# Patient Record
Sex: Male | Born: 1970 | Race: Black or African American | Hispanic: No | Marital: Married
Health system: Southern US, Community
[De-identification: ages and names within clinical notes are randomized; demographics above are authoritative.]

## PROBLEM LIST (undated history)

## (undated) DIAGNOSIS — I509 Heart failure, unspecified: Secondary | ICD-10-CM

## (undated) DIAGNOSIS — I10 Essential (primary) hypertension: Secondary | ICD-10-CM

## (undated) DIAGNOSIS — I639 Cerebral infarction, unspecified: Secondary | ICD-10-CM

## (undated) HISTORY — PX: EYE SURGERY: SHX253

---

## 1998-08-12 ENCOUNTER — Emergency Department (HOSPITAL_COMMUNITY): Admission: EM | Admit: 1998-08-12 | Discharge: 1998-08-12 | Payer: Self-pay | Admitting: Emergency Medicine

## 1999-06-17 ENCOUNTER — Emergency Department (HOSPITAL_COMMUNITY): Admission: EM | Admit: 1999-06-17 | Discharge: 1999-06-17 | Payer: Self-pay | Admitting: *Deleted

## 2000-01-11 ENCOUNTER — Encounter: Payer: Self-pay | Admitting: Emergency Medicine

## 2000-01-11 ENCOUNTER — Emergency Department (HOSPITAL_COMMUNITY): Admission: EM | Admit: 2000-01-11 | Discharge: 2000-01-11 | Payer: Self-pay

## 2000-02-10 ENCOUNTER — Emergency Department (HOSPITAL_COMMUNITY): Admission: EM | Admit: 2000-02-10 | Discharge: 2000-02-10 | Payer: Self-pay | Admitting: Emergency Medicine

## 2001-05-31 ENCOUNTER — Emergency Department (HOSPITAL_COMMUNITY): Admission: EM | Admit: 2001-05-31 | Discharge: 2001-05-31 | Payer: Self-pay | Admitting: Emergency Medicine

## 2001-06-06 ENCOUNTER — Encounter: Admission: RE | Admit: 2001-06-06 | Discharge: 2001-06-06 | Payer: Self-pay | Admitting: Internal Medicine

## 2001-08-30 ENCOUNTER — Emergency Department (HOSPITAL_COMMUNITY): Admission: EM | Admit: 2001-08-30 | Discharge: 2001-08-30 | Payer: Self-pay | Admitting: Emergency Medicine

## 2001-11-24 ENCOUNTER — Emergency Department (HOSPITAL_COMMUNITY): Admission: EM | Admit: 2001-11-24 | Discharge: 2001-11-24 | Payer: Self-pay | Admitting: Emergency Medicine

## 2002-05-04 ENCOUNTER — Encounter: Admission: RE | Admit: 2002-05-04 | Discharge: 2002-05-04 | Payer: Self-pay | Admitting: Internal Medicine

## 2002-12-16 ENCOUNTER — Emergency Department (HOSPITAL_COMMUNITY): Admission: EM | Admit: 2002-12-16 | Discharge: 2002-12-16 | Payer: Self-pay | Admitting: Emergency Medicine

## 2002-12-16 ENCOUNTER — Encounter: Payer: Self-pay | Admitting: Emergency Medicine

## 2004-01-09 ENCOUNTER — Emergency Department (HOSPITAL_COMMUNITY): Admission: EM | Admit: 2004-01-09 | Discharge: 2004-01-09 | Payer: Self-pay | Admitting: Emergency Medicine

## 2006-09-01 ENCOUNTER — Emergency Department (HOSPITAL_COMMUNITY): Admission: EM | Admit: 2006-09-01 | Discharge: 2006-09-01 | Payer: Self-pay | Admitting: Family Medicine

## 2006-12-18 ENCOUNTER — Emergency Department (HOSPITAL_COMMUNITY): Admission: EM | Admit: 2006-12-18 | Discharge: 2006-12-18 | Payer: Self-pay | Admitting: Family Medicine

## 2008-06-06 ENCOUNTER — Emergency Department (HOSPITAL_COMMUNITY): Admission: EM | Admit: 2008-06-06 | Discharge: 2008-06-06 | Payer: Self-pay | Admitting: Emergency Medicine

## 2009-04-11 ENCOUNTER — Inpatient Hospital Stay (HOSPITAL_COMMUNITY): Admission: EM | Admit: 2009-04-11 | Discharge: 2009-04-13 | Payer: Self-pay | Admitting: Emergency Medicine

## 2009-04-11 ENCOUNTER — Ambulatory Visit: Payer: Self-pay | Admitting: Cardiology

## 2009-04-11 ENCOUNTER — Encounter (INDEPENDENT_AMBULATORY_CARE_PROVIDER_SITE_OTHER): Payer: Self-pay | Admitting: Internal Medicine

## 2009-04-12 ENCOUNTER — Encounter: Payer: Self-pay | Admitting: Emergency Medicine

## 2009-05-13 DIAGNOSIS — R0602 Shortness of breath: Secondary | ICD-10-CM | POA: Insufficient documentation

## 2009-05-13 DIAGNOSIS — I6350 Cerebral infarction due to unspecified occlusion or stenosis of unspecified cerebral artery: Secondary | ICD-10-CM | POA: Insufficient documentation

## 2009-05-13 DIAGNOSIS — R079 Chest pain, unspecified: Secondary | ICD-10-CM

## 2009-05-13 DIAGNOSIS — M549 Dorsalgia, unspecified: Secondary | ICD-10-CM | POA: Insufficient documentation

## 2009-05-13 DIAGNOSIS — E669 Obesity, unspecified: Secondary | ICD-10-CM | POA: Insufficient documentation

## 2009-05-13 DIAGNOSIS — I10 Essential (primary) hypertension: Secondary | ICD-10-CM

## 2009-05-16 ENCOUNTER — Encounter (INDEPENDENT_AMBULATORY_CARE_PROVIDER_SITE_OTHER): Payer: Self-pay | Admitting: *Deleted

## 2010-10-31 ENCOUNTER — Emergency Department (HOSPITAL_COMMUNITY)
Admission: EM | Admit: 2010-10-31 | Discharge: 2010-10-31 | Payer: Self-pay | Source: Home / Self Care | Admitting: Emergency Medicine

## 2010-11-03 LAB — POCT CARDIAC MARKERS
CKMB, poc: 1.1 ng/mL (ref 1.0–8.0)
Myoglobin, poc: 58.1 ng/mL (ref 12–200)
Troponin i, poc: <0.05 ng/mL (ref 0.00–0.09)

## 2010-11-05 LAB — POCT CARDIAC MARKERS
CKMB, poc: <1 ng/mL — ABNORMAL LOW (ref 1.0–8.0)
Myoglobin, poc: 80.3 ng/mL (ref 12–200)
Troponin i, poc: <0.05 ng/mL (ref 0.00–0.09)

## 2011-01-26 LAB — BASIC METABOLIC PANEL
BUN: 14 mg/dL (ref 6–23)
BUN: 8 mg/dL (ref 6–23)
CO2: 27 mEq/L (ref 19–32)
CO2: 30 mEq/L (ref 19–32)
Calcium: 11.4 mg/dL — ABNORMAL HIGH (ref 8.4–10.5)
Calcium: 11.5 mg/dL — ABNORMAL HIGH (ref 8.4–10.5)
Chloride: 103 mEq/L (ref 96–112)
Chloride: 104 mEq/L (ref 96–112)
Creatinine, Ser: 1.07 mg/dL (ref 0.4–1.5)
Creatinine, Ser: 1.31 mg/dL (ref 0.4–1.5)
GFR calc Af Amer: >60 mL/min (ref >60)
GFR calc Af Amer: >60 mL/min (ref >60)
GFR calc non Af Amer: >60 mL/min (ref >60)
GFR calc non Af Amer: >60 mL/min (ref >60)
Glucose, Bld: 111 mg/dL — ABNORMAL HIGH (ref 70–99)
Glucose, Bld: 98 mg/dL (ref 70–99)
Potassium: 3.8 mEq/L (ref 3.5–5.1)
Potassium: 4.1 mEq/L (ref 3.5–5.1)
Sodium: 138 mEq/L (ref 135–145)
Sodium: 138 mEq/L (ref 135–145)

## 2011-01-26 LAB — CBC
HCT: 47 % (ref 39.0–52.0)
HCT: 47.3 % (ref 39.0–52.0)
Hemoglobin: 15.8 g/dL (ref 13.0–17.0)
Hemoglobin: 15.9 g/dL (ref 13.0–17.0)
MCHC: 33.7 g/dL (ref 30.0–36.0)
MCHC: 33.7 g/dL (ref 30.0–36.0)
MCV: 92.1 fL (ref 78.0–100.0)
MCV: 92.9 fL (ref 78.0–100.0)
Platelets: 233 10*3/uL (ref 150–400)
Platelets: 248 10*3/uL (ref 150–400)
RBC: 5.06 MIL/uL (ref 4.22–5.81)
RBC: 5.14 MIL/uL (ref 4.22–5.81)
RDW: 13.7 % (ref 11.5–15.5)
RDW: 14.2 % (ref 11.5–15.5)
WBC: 6.8 10*3/uL (ref 4.0–10.5)
WBC: 9.5 10*3/uL (ref 4.0–10.5)

## 2011-01-26 LAB — LIPID PANEL
Cholesterol: 153 mg/dL (ref 0–200)
HDL: 28 mg/dL — ABNORMAL LOW (ref >39)
LDL Cholesterol: 110 mg/dL — ABNORMAL HIGH (ref 0–99)
Total CHOL/HDL Ratio: 5.5 RATIO
Triglycerides: 74 mg/dL (ref <150)
VLDL: 15 mg/dL (ref 0–40)

## 2011-01-26 LAB — D-DIMER, QUANTITATIVE: D-Dimer, Quant: <0.22 ug/mL-FEU (ref 0.00–0.48)

## 2011-01-26 LAB — TROPONIN I
Troponin I: 0.02 ng/mL (ref 0.00–0.06)
Troponin I: 0.02 ng/mL (ref 0.00–0.06)
Troponin I: 0.02 ng/mL (ref 0.00–0.06)

## 2011-01-26 LAB — CK TOTAL AND CKMB (NOT AT ARMC)
CK, MB: 1.4 ng/mL (ref 0.3–4.0)
CK, MB: 1.5 ng/mL (ref 0.3–4.0)
CK, MB: 2.1 ng/mL (ref 0.3–4.0)
Relative Index: 1.3 (ref 0.0–2.5)
Relative Index: 1.3 (ref 0.0–2.5)
Relative Index: INVALID (ref 0.0–2.5)
Total CK: 107 U/L (ref 7–232)
Total CK: 157 U/L (ref 7–232)
Total CK: 98 U/L (ref 7–232)

## 2011-01-26 LAB — BRAIN NATRIURETIC PEPTIDE
Pro B Natriuretic peptide (BNP): <30 pg/mL (ref 0.0–100.0)
Pro B Natriuretic peptide (BNP): <30 pg/mL (ref 0.0–100.0)

## 2011-01-26 LAB — HEMOGLOBIN A1C
Hgb A1c MFr Bld: 5.7 % (ref 4.6–6.1)
Mean Plasma Glucose: 117 mg/dL

## 2011-01-26 LAB — DIFFERENTIAL
Basophils Absolute: 0.1 10*3/uL (ref 0.0–0.1)
Basophils Relative: 1 % (ref 0–1)
Eosinophils Absolute: 0.1 10*3/uL (ref 0.0–0.7)
Eosinophils Relative: 1 % (ref 0–5)
Lymphocytes Relative: 32 % (ref 12–46)
Lymphs Abs: 3 10*3/uL (ref 0.7–4.0)
Monocytes Absolute: 0.9 10*3/uL (ref 0.1–1.0)
Monocytes Relative: 9 % (ref 3–12)
Neutro Abs: 5.4 10*3/uL (ref 1.7–7.7)
Neutrophils Relative %: 57 % (ref 43–77)

## 2011-01-26 LAB — POCT CARDIAC MARKERS
CKMB, poc: <1 ng/mL — ABNORMAL LOW (ref 1.0–8.0)
Myoglobin, poc: 34.8 ng/mL (ref 12–200)
Troponin i, poc: <0.05 ng/mL (ref 0.00–0.09)

## 2011-01-26 LAB — HOMOCYSTEINE: Homocysteine: 10.2 umol/L (ref 4.0–15.4)

## 2011-01-26 LAB — TSH: TSH: 1.951 u[IU]/mL (ref 0.350–4.500)

## 2011-03-03 NOTE — H&P (Signed)
NAMEDRAYCE, Charles Callahan                  ACCOUNT NO.:  [id number]   MEDICAL RECORD NO.:  [id number]          PATIENT TYPE:  EMS   LOCATION:  ED                           FACILITY:  [facility]   PHYSICIAN:  Maryla Morrow, MD        DATE OF BIRTH:  [birth year]   DATE OF ADMISSION:  04/10/2009  DATE OF DISCHARGE:                              HISTORY & PHYSICAL   CHIEF COMPLAINT:  Chest pain.   HISTORY OF PRESENT ILLNESS:  Charles Callahan is a very pleasant 40 year old  young man with a history of untreated hypertension as well as tobacco  abuse, who presents to the ED today with 1 to 2 weeks worth of  intermittent chest pain, stabbing in character, about 6 to 7 over 10 in  intensity in the precordial area with some radiation to the  interscapular area for the last 1 to 2 weeks.  Patient states that the  chest pain episodes are unrelated to any activities.  They are  spontaneous in onset, vigorous on presentation, and then resolve  spontaneously within a few minutes.  He does state that they have been  pretty recurrent lately.  He also admits to the fact that he has  coronary artery disease running in his family, which made him also  concerned for further evaluation.  He currently was having another  episode of chest pain while I was talking to him, which he describes  similar to as described above.  He had an episode of nausea and vomiting  with diaphoresis earlier today.  He denies any abdominal pain.  He  denies any diarrhea or constipation.  He denies any headache.  He denies  any leg swelling, PND, orthopnea.  He does admit to the fact of  exertional dyspnea for the last several weeks.  He denies any recent  fever, chills, or any viral prodrome.   PAST MEDICAL HISTORY:  Hypertension, for which he used to take  medications from the outpatient clinic back home but has not been taking  that for a while.  He otherwise has a history of tobacco abuse.   PAST SURGICAL HISTORY:  Significant for eye surgery  as a child for  strabismus, otherwise negative.   Patient is on no medications.   ALLERGIES:  Patient relates a side effect to MORPHINE which causes  nausea and vomiting.   SOCIAL HISTORY:  Positive for patient smoking about a pack of cigarettes  a day for the last 12 months.  He denies any drug abuse.  He is an  occasional drinker.  He is single.  He has a healthy daughter.   FAMILY HISTORY:  Significant for premature coronary artery disease in  one of his cousins, who died from an MI at age 53.  Also a history of  stroke.  No history of sudden other cardiac problems.   REVIEW OF SYSTEMS:  For all pertinent positives and negatives, see HPI,  otherwise complete 12-point review of systems perform and negative.   PHYSICAL EXAMINATION:  A 40 year old [place] American gentleman  currently oriented to time,  place, and person in no acute distress.  Temperature 98, blood pressure on presentation 179/111, pulse 89,  respirations 21, saturation 99% on room air.  HEENT:  Pupils are equal, round and reactive to light.  Extraocular  movements are intact.  No JVD.  No lymphadenopathy.  Head is  normocephalic and atraumatic.  CHEST:  Clear to auscultation bilaterally.  HEART:  Regular rate and rhythm.  S1 and S2 is normal.  ABDOMEN:  Soft and nontender.  EXTREMITIES:  No clubbing, cyanosis or edema.  Dorsalis pedis pulses are  2+ bilaterally.  Strength is 5/5 in all 4 extremities. speech is intact.  PSYCHIATRIC:  Mood and affect are normal.   PERTINENT LABS/DIAGNOSTIC DATA:  A 12-lead EKG performed in the ED  revealed a normal sinus rhythm with leftward axis and mild voltage  criteria for LVH, otherwise no evidence of any injury pattern or  infarction.   Other labs, including CBC and basic metabolic panel were unremarkable.  His first set of cardiac enzymes were negative.   Portable chest x-ray was also negative.   ASSESSMENT/PLAN:  Charles Callahan is a very pleasant 40 year old gentleman   with a history of hypertension, who presents with:  1. Chest pain which is probably atypical in nature; however, acute      coronary syndrome cannot be ruled out.  2. Considering the history of tobacco abuse, untreated hypertension,      and premature coronary artery disease, he is at intermediate to      high risk for coronary artery disease.  I will therefore consider      doing a nuclear stress test on him.  3. Exertional shortness of breath:  This has been relatively new for      the young man.  I would like to explore any undiagnosed      cardiomyopathy or pericarditis as the cause of his symptoms.  4. Uncontrolled hypertension:  This is both systolic and diastolic and      probably will need a dual agent; however, I will first start with      hydrochlorothiazide and treat with p.r.n. hydralazine until a      stress test is done.  5. Obesity:  Patient is recommended weight loss.  6. Tobacco abuse:  Patient counseled to quit tobacco abuse.  7. Also check a fasting lipid panel, hemoglobin, hemoglobin A1C, and      TSH.  8. If the tests are negative, patient can safely go home on NSAIDs or      other pain medications for his musculoskeletal pain, which will be      essentially the cause of his symptoms if the tests are negative.      Maryla Morrow, MD  Electronically Signed     AP/MEDQ  D:  04/11/2009  T:  04/11/2009  Job:  [id number]

## 2011-03-03 NOTE — Discharge Summary (Signed)
Charles Callahan, Charles Callahan                  ACCOUNT NO.:  [id number]   MEDICAL RECORD NO.:  [id number]          PATIENT TYPE:  INP   LOCATION:  1413                         FACILITY:  [facility]   PHYSICIAN:  Theodosia Paling, MD    DATE OF BIRTH:  13-Mar-1971   DATE OF ADMISSION:  04/10/2009  DATE OF DISCHARGE:  04/13/2009                               DISCHARGE SUMMARY   PRIMARY CARE PHYSICIAN:  The patient dos not have a primary care  physician.   ADMITTING HISTORY:  Please refer to the excellent admission note  dictated by Dr. Maryla Morrow under history of present illness.   DISCHARGE DIAGNOSES:  Chest pain was negative cardiac enzymes and normal  echocardiogram.   SECONDARY DIAGNOSIS:  History of hypertension.   DISCHARGE MEDICATIONS:  1. Aspirin enteric-coated 81 mg p.o. daily.  2. Omeprazole 20 mg p.o. q.12h.  3. Maalox 1 teaspoon p.o. q.8h. p.r.n.   HOSPITAL COURSE:  The following issues were addressed during the  hospitalization:  1. Chest pain.  The patient underwent inpatient evaluation.  Telemetry      was negative.  Echocardiogram was normal.  Enzymes were negative.      The patient will be getting an outpatient stress test done at      [facility].  2. History of tobacco abuse.  The patient was counseled against the      risk of tobacco abuse.  3. Hypertension.  The patient's blood pressure actually was within      normal limits throughout the hospitalization without any      antihypertensive regimen.   DISPOSITION:  The patient will follow up with [facility] for  outpatient stress test.   PROCEDURES PERFORMED/CONSULTATIONS PERFORMED:  Stress test, stress  Myoview.   Total time spent in discharge of this patient around one hour.      Theodosia Paling, MD  Electronically Signed     NP/MEDQ  D:  05/05/2009  T:  05/05/2009  Job:  [id number]   cc:   [facility]

## 2011-04-21 ENCOUNTER — Emergency Department (HOSPITAL_COMMUNITY)
Admission: EM | Admit: 2011-04-21 | Discharge: 2011-04-21 | Disposition: A | Discharge status: Home / Self Care | Payer: Self-pay | Attending: Emergency Medicine | Admitting: Emergency Medicine

## 2011-04-21 ENCOUNTER — Emergency Department (HOSPITAL_COMMUNITY): Payer: Self-pay

## 2011-04-21 DIAGNOSIS — R1013 Epigastric pain: Secondary | ICD-10-CM | POA: Insufficient documentation

## 2011-04-21 DIAGNOSIS — K2970 Gastritis, unspecified, without bleeding: Secondary | ICD-10-CM | POA: Insufficient documentation

## 2011-04-21 LAB — URINALYSIS, ROUTINE W REFLEX MICROSCOPIC
Glucose, UA: NEGATIVE mg/dL
Ketones, ur: NEGATIVE mg/dL
Leukocytes, UA: NEGATIVE
Specific Gravity, Urine: 1.021 (ref 1.005–1.030)
pH: 7 (ref 5.0–8.0)

## 2011-04-21 LAB — COMPREHENSIVE METABOLIC PANEL
ALT: 23 U/L (ref 0–53)
AST: 17 U/L (ref 0–37)
CO2: 24 mEq/L (ref 19–32)
Chloride: 103 mEq/L (ref 96–112)
GFR calc non Af Amer: >60 mL/min (ref >60)
Potassium: 3.7 mEq/L (ref 3.5–5.1)
Sodium: 136 mEq/L (ref 135–145)
Total Bilirubin: 0.2 mg/dL — ABNORMAL LOW (ref 0.3–1.2)

## 2011-04-21 LAB — CBC
Hemoglobin: 15.9 g/dL (ref 13.0–17.0)
MCH: 31 pg (ref 26.0–34.0)
RBC: 5.13 MIL/uL (ref 4.22–5.81)
WBC: 8.2 10*3/uL (ref 4.0–10.5)

## 2011-04-21 LAB — DIFFERENTIAL
Basophils Relative: 0 % (ref 0–1)
Monocytes Relative: 11 % (ref 3–12)
Neutro Abs: 3.5 10*3/uL (ref 1.7–7.7)
Neutrophils Relative %: 42 % — ABNORMAL LOW (ref 43–77)

## 2012-04-26 ENCOUNTER — Encounter (HOSPITAL_COMMUNITY): Payer: Self-pay | Admitting: Emergency Medicine

## 2012-04-26 ENCOUNTER — Emergency Department (HOSPITAL_COMMUNITY)
Admission: EM | Admit: 2012-04-26 | Discharge: 2012-04-26 | Disposition: A | Discharge status: Home / Self Care | Payer: Self-pay | Attending: Emergency Medicine | Admitting: Emergency Medicine

## 2012-04-26 DIAGNOSIS — L259 Unspecified contact dermatitis, unspecified cause: Secondary | ICD-10-CM | POA: Insufficient documentation

## 2012-04-26 DIAGNOSIS — Z23 Encounter for immunization: Secondary | ICD-10-CM | POA: Insufficient documentation

## 2012-04-26 HISTORY — DX: Essential (primary) hypertension: I10

## 2012-04-26 MED ORDER — TRIAMCINOLONE ACETONIDE 0.1 % EX CREA: TOPICAL_CREAM | Freq: Two times a day (BID) | CUTANEOUS | Status: AC

## 2012-04-26 MED ORDER — HYDROXYZINE PAMOATE 100 MG PO CAPS: 100.0000 mg | ORAL_CAPSULE | Freq: Three times a day (TID) | ORAL | Status: AC | PRN

## 2012-04-26 MED ORDER — TETANUS-DIPHTH-ACELL PERTUSSIS 5-2.5-18.5 LF-MCG/0.5 IM SUSP
0.5000 mL | Freq: Once | INTRAMUSCULAR | Status: AC
  Administered 2012-04-26: 0.5 mL via INTRAMUSCULAR
  Filled 2012-04-26: qty 0.5

## 2012-04-26 NOTE — ED Provider Notes (Signed)
History     CSN: [id number]  Arrival date & time 04/26/12  1021   First MD Initiated Contact with Patient 04/26/12 1040      No chief complaint on file.   (Consider location/radiation/quality/duration/timing/severity/associated sxs/prior treatment) Patient is a 41 y.o. male presenting with rash. The history is provided by the patient.  Rash  This is a new problem. The current episode started yesterday. The problem has not changed since onset.The problem is associated with an unknown factor. There has been no fever. The rash is present on the left arm. The pain is at a severity of 2/10. The patient is experiencing no pain. Associated symptoms include itching and pain. He has tried anti-itch cream and antihistamines for the symptoms. The treatment provided no relief.  Pt with a rash to left forearm. States possible spider bite, but unsure, did not see a spider, but works in a area with many cob webs. Area red, itchy. Bought some over the counter cream, not sure the name, also took benadryl, but states she does not prefer to take benadryl. No improvement  No past medical history on file.  No past surgical history on file.  No family history on file.  History  Substance Use Topics  . Smoking status: Not on file  . Smokeless tobacco: Not on file  . Alcohol Use: Not on file      Review of Systems  Constitutional: Negative for fever and chills.  HENT: Negative for facial swelling and neck stiffness.   Respiratory: Negative.   Cardiovascular: Negative.   Gastrointestinal: Negative.   Musculoskeletal: Negative for joint swelling.  Skin: Positive for itching and rash.  Neurological: Negative for dizziness, light-headedness and headaches.    Allergies  Morphine and related  Home Medications   Current Outpatient Rx  Name Route Sig Dispense Refill  . HYDROCHLOROTHIAZIDE 25 MG PO TABS Oral Take 25 mg by mouth daily.      BP 171/108  Pulse 77  Temp 98.2 F (36.8 C) (Oral)   Resp 16  SpO2 100%  Physical Exam  Nursing note and vitals reviewed. Constitutional: He appears well-developed and well-nourished.  HENT:  Head: Normocephalic.  Eyes: Conjunctivae are normal.  Cardiovascular: Normal rate, regular rhythm and normal heart sounds.   Pulmonary/Chest: Breath sounds normal. No respiratory distress. He has no wheezes. He has no rales.  Skin: Skin is warm and dry.       There is an erythemous are to the proximal anterior forearm, with several papules in the center. No tenderness, no induration. Area measures about 4cm in diameter.  Psychiatric: He has a normal mood and affect.    ED Course  Procedures (including critical care time)  Rash appears consistent with localized contact dermatitis. Will treat pepcid and vistaril for itching, kenalog cream topically. Will d/c home with follow up as needed.  1. Contact dermatitis       MDM          Lottie Mussel, PA 04/26/12 1513

## 2012-04-26 NOTE — Progress Notes (Signed)
Pt listed as self pay with no insurance coverage Pt confirms he is self pay [place] resident CM and [facility] community liaison spoke with him Pt offered [facility] services to assist with finding a [place] self pay provider Pt accepted information   

## 2012-04-26 NOTE — ED Notes (Signed)
Pt c/o bug bited on inner aspect of l/forearm. Pt stated that he walked into a spider web yesterday. C/o pain and itching at site

## 2012-04-27 NOTE — ED Provider Notes (Signed)
Medical screening examination/treatment/procedure(s) were performed by non-physician practitioner and as supervising physician I was immediately available for consultation/collaboration.  Teasia Zapf, MD 04/27/12 0940 

## 2012-11-09 ENCOUNTER — Emergency Department (INDEPENDENT_AMBULATORY_CARE_PROVIDER_SITE_OTHER): Payer: Self-pay

## 2012-11-09 ENCOUNTER — Emergency Department (INDEPENDENT_AMBULATORY_CARE_PROVIDER_SITE_OTHER)
Admission: EM | Admit: 2012-11-09 | Discharge: 2012-11-09 | Disposition: A | Discharge status: Home / Self Care | Payer: Self-pay | Source: Home / Self Care | Attending: Family Medicine | Admitting: Family Medicine

## 2012-11-09 ENCOUNTER — Encounter (HOSPITAL_COMMUNITY): Payer: Self-pay | Admitting: *Deleted

## 2012-11-09 DIAGNOSIS — J40 Bronchitis, not specified as acute or chronic: Secondary | ICD-10-CM

## 2012-11-09 DIAGNOSIS — J111 Influenza due to unidentified influenza virus with other respiratory manifestations: Secondary | ICD-10-CM

## 2012-11-09 MED ORDER — ALBUTEROL SULFATE HFA 108 (90 BASE) MCG/ACT IN AERS: 1.0000 | INHALATION_SPRAY | Freq: Four times a day (QID) | RESPIRATORY_TRACT | Status: DC | PRN

## 2012-11-09 MED ORDER — OSELTAMIVIR PHOSPHATE 75 MG PO CAPS: 75.0000 mg | ORAL_CAPSULE | Freq: Two times a day (BID) | ORAL | Status: DC

## 2012-11-09 MED ORDER — AZITHROMYCIN 250 MG PO TABS: ORAL_TABLET | ORAL | Status: DC

## 2012-11-09 MED ORDER — PREDNISONE 20 MG PO TABS: ORAL_TABLET | ORAL | Status: DC

## 2012-11-09 MED ORDER — ONDANSETRON 4 MG PO TBDP: 4.0000 mg | ORAL_TABLET | Freq: Three times a day (TID) | ORAL | Status: DC | PRN

## 2012-11-09 MED ORDER — IBUPROFEN 600 MG PO TABS: 600.0000 mg | ORAL_TABLET | Freq: Three times a day (TID) | ORAL | Status: DC | PRN

## 2012-11-09 NOTE — ED Notes (Signed)
Pt reports flu like symptoms that started yesterday - body aches, fever, vomiting, diarrhea, productive cough with yellow sputum

## 2012-11-09 NOTE — ED Notes (Signed)
Pt informed of waiting on xray reports denies needs at this time - family at bedside

## 2012-11-09 NOTE — ED Provider Notes (Signed)
History     CSN: [id number]  Arrival date & time 11/09/12  1040   First MD Initiated Contact with Patient 11/09/12 1056      Chief Complaint  Patient presents with  . Influenza    (Consider location/radiation/quality/duration/timing/severity/associated sxs/prior treatment) HPI Comments: 42 year old smoker male with history of morbid obesity, hypertension and chronic bronchitis. Here complaining of generalized body aches, subjective fever, productive cough with yellow sputum, wheezing and shortness of breath. Symptoms also associated with 3 episodes of loose stools with no blood and one episode of emesis with food content after dinner last night. Patient has not taken his blood pressure medication (hydrochlorothiazide 25 mg) in the last 2 days. Also states it has not smoked cigarettes in the last 2 days. Not taking any medications for his symptoms. Denies chest pain or abdominal pain here. Has been able to tolerate fluids today with no vomiting.   Past Medical History  Diagnosis Date  . Hypertension     Past Surgical History  Procedure Date  . Eye surgery     Family History  Problem Relation Age of Onset  . Diabetes Mother   . Hypertension Mother     History  Substance Use Topics  . Smoking status: Current Every Day Smoker -- 1.0 packs/day    Types: Cigarettes  . Smokeless tobacco: Not on file  . Alcohol Use: Yes      Review of Systems  Constitutional: Positive for fever, chills and appetite change.  HENT: Positive for congestion, rhinorrhea and sinus pressure. Negative for ear pain, sore throat, neck pain and neck stiffness.   Eyes: Negative for visual disturbance.  Respiratory: Positive for cough, shortness of breath and wheezing.   Cardiovascular: Negative for chest pain and leg swelling.  Gastrointestinal: Positive for nausea, vomiting and diarrhea. Negative for abdominal pain.  Genitourinary: Negative for dysuria.  Musculoskeletal: Positive for myalgias and  arthralgias. Negative for joint swelling.  Skin: Negative for rash.  Neurological: Positive for headaches. Negative for dizziness.    Allergies  Morphine and related  Home Medications   Current Outpatient Rx  Name  Route  Sig  Dispense  Refill  . ALBUTEROL SULFATE HFA 108 (90 BASE) MCG/ACT IN AERS   Inhalation   Inhale 1-2 puffs into the lungs every 6 (six) hours as needed for wheezing.   1 Inhaler   0   . AZITHROMYCIN 250 MG PO TABS      2 tabs by mouth on day one then one tab daily for 4 more days.   6 tablet   0   . HYDROCHLOROTHIAZIDE 25 MG PO TABS   Oral   Take 25 mg by mouth daily.         . IBUPROFEN 600 MG PO TABS   Oral   Take 1 tablet (600 mg total) by mouth every 8 (eight) hours as needed for pain or fever.   20 tablet   0   . ONDANSETRON 4 MG PO TBDP   Oral   Take 1 tablet (4 mg total) by mouth every 8 (eight) hours as needed for nausea.   10 tablet   0   . OSELTAMIVIR PHOSPHATE 75 MG PO CAPS   Oral   Take 1 capsule (75 mg total) by mouth every 12 (twelve) hours.   10 capsule   0   . PREDNISONE 20 MG PO TABS      2 tabs by mouth daily for 5 days   10 tablet  0   . TRIAMCINOLONE ACETONIDE 0.1 % EX CREA   Topical   Apply topically 2 (two) times daily.   30 g   0     BP 153/102  Pulse 99  Temp 99 F (37.2 C) (Oral)  Resp 18  Ht 5\' 5"  (1.651 m)  Wt 240 lb (108.863 kg)  BMI 39.94 kg/m2  SpO2 97%  Physical Exam  Nursing note and vitals reviewed. Constitutional: He is oriented to person, place, and time. No distress.       Looks febrile, laying in bed  HENT:       Nasal Congestion with erythema and swelling of nasal turbinates, clear rhinorrhea. Pharyngeal erythema no exudates. No uvula deviation. No trismus. TM's with increased vascular markings and some dullness bilaterally no swelling or bulging   Eyes: Conjunctivae normal and EOM are normal. Pupils are equal, round, and reactive to light. Right eye exhibits no discharge. Left  eye exhibits no discharge. No scleral icterus.  Neck: Neck supple. No JVD present. No thyromegaly present.  Cardiovascular: Normal rate, regular rhythm and normal heart sounds.  Exam reveals no gallop and no friction rub.   No murmur heard. Pulmonary/Chest: Effort normal and breath sounds normal. No respiratory distress. He has no wheezes. He has no rales.       Few sporadic expiratory rhonchi, bronchitic cough.  Abdominal: Soft. Bowel sounds are normal. He exhibits no distension and no mass. There is no tenderness. There is no rebound and no guarding.       Obese.  Lymphadenopathy:    He has no cervical adenopathy.  Neurological: He is alert and oriented to person, place, and time.  Skin: No rash noted. He is not diaphoretic.    ED Course  Procedures (including critical care time)  Labs Reviewed - No data to display Dg Chest 2 View  11/09/2012  *RADIOLOGY REPORT*  Clinical Data: Cough, fever, shortness of breath  CHEST - 2 VIEW  Comparison: 10/31/2010  Findings: Cardiomediastinal silhouette is stable.  No acute infiltrate or pleural effusion.  No pulmonary edema.  Bony thorax is stable.  IMPRESSION: .  No active disease.  No significant change.   Original Report Authenticated By: Natasha Mead, M.D.      1. Bronchitis   2. Influenza-like illness       MDM  Treated with ondansetron, tamiflu and prednisone. A hold prescription for azithromycin provided. Asked to keep hydrated and to take blood pressure medications.  Supportive care and red flags that should prompt his return to medical attention discussed with patient and provided in writing.         Sharin Grave, MD 11/11/12 [phone]

## 2015-02-27 ENCOUNTER — Emergency Department (HOSPITAL_COMMUNITY)
Admission: EM | Admit: 2015-02-27 | Discharge: 2015-02-27 | Disposition: A | Discharge status: Home / Self Care | Payer: Self-pay | Attending: Emergency Medicine | Admitting: Emergency Medicine

## 2015-02-27 ENCOUNTER — Encounter (HOSPITAL_COMMUNITY): Payer: Self-pay | Admitting: Emergency Medicine

## 2015-02-27 DIAGNOSIS — I10 Essential (primary) hypertension: Secondary | ICD-10-CM | POA: Insufficient documentation

## 2015-02-27 DIAGNOSIS — Z720 Tobacco use: Secondary | ICD-10-CM | POA: Insufficient documentation

## 2015-02-27 DIAGNOSIS — Z79899 Other long term (current) drug therapy: Secondary | ICD-10-CM | POA: Insufficient documentation

## 2015-02-27 DIAGNOSIS — M25512 Pain in left shoulder: Secondary | ICD-10-CM | POA: Insufficient documentation

## 2015-02-27 DIAGNOSIS — J209 Acute bronchitis, unspecified: Secondary | ICD-10-CM | POA: Insufficient documentation

## 2015-02-27 MED ORDER — AMOXICILLIN 500 MG PO CAPS: 500.0000 mg | ORAL_CAPSULE | Freq: Three times a day (TID) | ORAL | Status: DC

## 2015-02-27 MED ORDER — HYDROCODONE-HOMATROPINE 5-1.5 MG/5ML PO SYRP: 5.0000 mL | ORAL_SOLUTION | Freq: Four times a day (QID) | ORAL | Status: DC | PRN

## 2015-02-27 MED ORDER — PREDNISONE 20 MG PO TABS: 60.0000 mg | ORAL_TABLET | Freq: Every day | ORAL | Status: DC

## 2015-02-27 MED ORDER — ALBUTEROL SULFATE HFA 108 (90 BASE) MCG/ACT IN AERS: 1.0000 | INHALATION_SPRAY | Freq: Four times a day (QID) | RESPIRATORY_TRACT | Status: DC | PRN

## 2015-02-27 NOTE — Discharge Instructions (Signed)

## 2015-02-27 NOTE — ED Provider Notes (Signed)
CSN: [id number]     Arrival date & time 02/27/15  [id number] History   First MD Initiated Contact with Patient 02/27/15 [id number]     Chief Complaint  Patient presents with  . Shoulder Pain     (Consider location/radiation/quality/duration/timing/severity/associated sxs/prior Treatment) HPI Comments: Patient presents to emergency department with complaints of pain in the left shoulder area that has been ongoing for 3 days. Pain is constant, but worsens when he moves the arm or coughs. He has noticed progressively worsening cough, with active of green sputum. He reports that his symptoms feel like when he had bronchitis in the past. He has not had any fever. There is no shortness of breath.  Patient is a 44 y.o. male presenting with shoulder pain.  Shoulder Pain   Past Medical History  Diagnosis Date  . Hypertension    Past Surgical History  Procedure Laterality Date  . Eye surgery     Family History  Problem Relation Age of Onset  . Diabetes Mother   . Hypertension Mother    History  Substance Use Topics  . Smoking status: Current Every Day Smoker -- 1.00 packs/day    Types: Cigarettes  . Smokeless tobacco: Not on file  . Alcohol Use: Yes    Review of Systems  Respiratory: Positive for cough. Negative for shortness of breath.   Musculoskeletal: Positive for arthralgias.  All other systems reviewed and are negative.     Allergies  Morphine and related  Home Medications   Prior to Admission medications   Medication Sig Start Date End Date Taking? Authorizing Provider  albuterol (PROVENTIL HFA;VENTOLIN HFA) 108 (90 BASE) MCG/ACT inhaler Inhale 1-2 puffs into the lungs every 6 (six) hours as needed for wheezing. 11/09/12   Adlih Moreno-Coll, MD  azithromycin (ZITHROMAX) 250 MG tablet 2 tabs by mouth on day one then one tab daily for 4 more days. 11/09/12   Adlih Moreno-Coll, MD  hydrochlorothiazide (HYDRODIURIL) 25 MG tablet Take 25 mg by mouth daily.    Historical Provider, MD   ibuprofen (ADVIL,MOTRIN) 600 MG tablet Take 1 tablet (600 mg total) by mouth every 8 (eight) hours as needed for pain or fever. 11/09/12   Adlih Moreno-Coll, MD  ondansetron (ZOFRAN-ODT) 4 MG disintegrating tablet Take 1 tablet (4 mg total) by mouth every 8 (eight) hours as needed for nausea. 11/09/12   Adlih Moreno-Coll, MD  oseltamivir (TAMIFLU) 75 MG capsule Take 1 capsule (75 mg total) by mouth every 12 (twelve) hours. 11/09/12   Adlih Moreno-Coll, MD  predniSONE (DELTASONE) 20 MG tablet 2 tabs by mouth daily for 5 days 11/09/12   Adlih Moreno-Coll, MD   BP 180/101 mmHg  Pulse 87  Resp 18  SpO2 100% Physical Exam  Constitutional: He is oriented to person, place, and time. He appears well-developed and well-nourished. No distress.  HENT:  Head: Normocephalic and atraumatic.  Right Ear: Hearing normal.  Left Ear: Hearing normal.  Nose: Nose normal.  Mouth/Throat: Oropharynx is clear and moist and mucous membranes are normal.  Eyes: Conjunctivae and EOM are normal. Pupils are equal, round, and reactive to light.  Neck: Normal range of motion. Neck supple.  Cardiovascular: Regular rhythm, S1 normal and S2 normal.  Exam reveals no gallop and no friction rub.   No murmur heard. Pulmonary/Chest: Effort normal and breath sounds normal. No respiratory distress. He exhibits no tenderness.  Abdominal: Soft. Normal appearance and bowel sounds are normal. There is no hepatosplenomegaly. There is no tenderness. There is no rebound,  no guarding, no tenderness at McBurney's point and negative Murphy's sign. No hernia.  Musculoskeletal:       Left shoulder: He exhibits decreased range of motion (Secondary to pain) and tenderness.  Neurological: He is alert and oriented to person, place, and time. He has normal strength. No cranial nerve deficit or sensory deficit. Coordination normal. GCS eye subscore is 4. GCS verbal subscore is 5. GCS motor subscore is 6.  Skin: Skin is warm, dry and intact. No rash  noted. No cyanosis.  Psychiatric: He has a normal mood and affect. His speech is normal and behavior is normal. Thought content normal.  Nursing note and vitals reviewed.   ED Course  Procedures (including critical care time) Labs Review Labs Reviewed - No data to display  Imaging Review No results found.   EKG Interpretation None      MDM   Final diagnoses:  None   bronchitis  Patient presents to the ER for evaluation of cough, chest congestion, green sputum production. Patient is a smoker. He has decreased breath sounds, but no bronchospasm currently. No clinical concern for pneumonia. Patient complaining of pain in the left shoulder. This has been constant for 3 days. It is reproducible by movement of the shoulder and palpation. There is no chest pain. This does not in anyway resemble acute coronary syndrome or cardiac etiology. Patient reports worsening of the pain when he coughs, but he is not short of breath. No hypoxia or tachycardia. No concern for PE. Patient be treated for acute bronchitis.    Gilda Creasehristopher J Ryott Rafferty, MD 02/27/15 [phone]

## 2015-02-27 NOTE — ED Notes (Signed)
Per pt, states left shoulder pain, no injury

## 2015-05-06 ENCOUNTER — Ambulatory Visit: Payer: Self-pay | Admitting: Family Medicine

## 2015-06-14 ENCOUNTER — Encounter: Payer: Self-pay | Admitting: Family Medicine

## 2015-06-14 ENCOUNTER — Other Ambulatory Visit: Payer: Self-pay

## 2015-06-14 ENCOUNTER — Ambulatory Visit (INDEPENDENT_AMBULATORY_CARE_PROVIDER_SITE_OTHER): Payer: No Typology Code available for payment source | Admitting: Family Medicine

## 2015-06-14 VITALS — BP 167/94 | HR 69 | Temp 98.2°F | Resp 16 | Ht 66.5 in | Wt 236.0 lb

## 2015-06-14 DIAGNOSIS — K219 Gastro-esophageal reflux disease without esophagitis: Secondary | ICD-10-CM

## 2015-06-14 DIAGNOSIS — J309 Allergic rhinitis, unspecified: Secondary | ICD-10-CM

## 2015-06-14 DIAGNOSIS — Z7189 Other specified counseling: Secondary | ICD-10-CM

## 2015-06-14 DIAGNOSIS — I10 Essential (primary) hypertension: Secondary | ICD-10-CM

## 2015-06-14 DIAGNOSIS — Z7689 Persons encountering health services in other specified circumstances: Secondary | ICD-10-CM

## 2015-06-14 DIAGNOSIS — Z0000 Encounter for general adult medical examination without abnormal findings: Secondary | ICD-10-CM

## 2015-06-14 MED ORDER — OMEPRAZOLE 20 MG PO CPDR: 20.0000 mg | DELAYED_RELEASE_CAPSULE | Freq: Every day | ORAL | Status: DC

## 2015-06-14 MED ORDER — CETIRIZINE HCL 10 MG PO TABS: 10.0000 mg | ORAL_TABLET | Freq: Every day | ORAL | Status: DC

## 2015-06-14 MED ORDER — LISINOPRIL-HYDROCHLOROTHIAZIDE 20-25 MG PO TABS: 1.0000 | ORAL_TABLET | Freq: Every day | ORAL | Status: DC

## 2015-06-14 NOTE — Progress Notes (Signed)
Patient ID: Charles Callahan, male   DOB: 04-Feb-1971, 44 y.o.   MRN: [id number]   Charles Callahan, is a 44 y.o. male  [id number]  [id number]  DOB - 05-09-1971  CC:  Chief Complaint  Patient presents with  . Establish Care    bp running high/ seeing black "dots" in vision        HPI: Charles Callahan is a 44 y.o. male here to establish care. He has not had regular health care in several years. He is currently on no medications for hypertension and that is his main concern. He reports no other chronic illness. Albuterol is his mediction list but that was for a bout of acute bronchitis. His problem list also includes a CVA. He reports never having been hospitalized for a stroke but was told he may have had a mini stroke at some point in the past. He reports no deficients related to that. He reports some right upper arm and shoulder pain and feels his right arm is a little weaker than left. He denies other chronic illnesses.  Allergies  Allergen Reactions  . Morphine And Related Nausea And Vomiting   Past Medical History  Diagnosis Date  . Hypertension    Current Outpatient Prescriptions on File Prior to Visit  Medication Sig Dispense Refill  . albuterol (PROVENTIL HFA;VENTOLIN HFA) 108 (90 BASE) MCG/ACT inhaler Inhale 1-2 puffs into the lungs every 6 (six) hours as needed for wheezing. 1 Inhaler 0  . amoxicillin (AMOXIL) 500 MG capsule Take 1 capsule (500 mg total) by mouth 3 (three) times daily. (Patient not taking: Reported on 06/14/2015) 30 capsule 0  . azithromycin (ZITHROMAX) 250 MG tablet 2 tabs by mouth on day one then one tab daily for 4 more days. (Patient not taking: Reported on 06/14/2015) 6 tablet 0  . hydrochlorothiazide (HYDRODIURIL) 25 MG tablet Take 25 mg by mouth daily.    Marland Kitchen HYDROcodone-homatropine (HYCODAN) 5-1.5 MG/5ML syrup Take 5 mLs by mouth every 6 (six) hours as needed for cough. (Patient not taking: Reported on 06/14/2015) 75 mL 0  . ibuprofen (ADVIL,MOTRIN) 600 MG tablet  Take 1 tablet (600 mg total) by mouth every 8 (eight) hours as needed for pain or fever. (Patient not taking: Reported on 06/14/2015) 20 tablet 0  . ondansetron (ZOFRAN-ODT) 4 MG disintegrating tablet Take 1 tablet (4 mg total) by mouth every 8 (eight) hours as needed for nausea. (Patient not taking: Reported on 06/14/2015) 10 tablet 0  . oseltamivir (TAMIFLU) 75 MG capsule Take 1 capsule (75 mg total) by mouth every 12 (twelve) hours. (Patient not taking: Reported on 06/14/2015) 10 capsule 0  . predniSONE (DELTASONE) 20 MG tablet Take 3 tablets (60 mg total) by mouth daily with breakfast. (Patient not taking: Reported on 06/14/2015) 15 tablet 0   No current facility-administered medications on file prior to visit.   Family History  Problem Relation Age of Onset  . Diabetes Mother   . Hypertension Mother    Social History   Social History  . Marital Status: Single    Spouse Name: N/A  . Number of Children: N/A  . Years of Education: N/A   Occupational History  . Not on file.   Social History Main Topics  . Smoking status: Current Every Day Smoker -- 1.00 packs/day    Types: Cigarettes  . Smokeless tobacco: Not on file  . Alcohol Use: No  . Drug Use: No  . Sexual Activity: Yes   Other Topics Concern  .  Not on file   Social History Narrative    Review of Systems: Constitutional: Negative for fever, chills, appetite change, weight loss,  Fatigue. Skin: Negative for rashes or lesions of concern. HENT: Negative for ear pain, ear discharge.nose bleeds. Positive for a lump on his forehead. Positive for nasal congestion daily Eyes: Negative for pain, discharge, redness, and visual disturbance.Positive for itching and redness Neck: Negative for pain, stiffness Respiratory: Positive for cough, shortness of breath occasionally with exertion. Cardiovascular: Negative for chest pain, palpitations and leg swelling. Gastrointestinal: Negative for abdominal pain, nausea, vomiting, diarrhea,  constipations. Positive for heartburn Genitourinary: Negative for dysuria, urgency, frequency, hematuria,  Musculoskeletal: Negative for back pain, joint pain, joint  swelling, and gait problem.  Neurological: Positive for occassional dizziness. Negative fortremors, seizures, syncope,   light-headedness, numbness . Positive for headaches.  Hematological: Ngativee for easy bruising or bleeding Psychiatric/Behavioral: Negative for depression, anxiety, decreased concentration, confusion   Objective:   Filed Vitals:   06/14/15 0857  BP: 167/94  Pulse: 69  Temp: 98.2 F (36.8 C)  Resp: 16    Physical Exam: Constitutional: Patient appears well-developed and well-nourished. No distress. HENT: Normocephalic, atraumatic, External right and left ear normal. Oropharynx is clear and moist.  Eyes: Conjunctivae and EOM are normal. PERRLA, no scleral icterus. Neck: Normal ROM. Neck supple. No lymphadenopathy, No thyromegaly. CVS: RRR, S1/S2 +, no murmurs, no gallops, no rubs Pulmonary: Effort and breath sounds normal, no stridor, rhonchi, wheezes, rales.  Abdominal: Soft. Normoactive BS,, no distension, tenderness, rebound or guarding.  Musculoskeletal: Normal range of motion. No edema and no tenderness. Hardly perceptual weakness of right arm Neuro: Alert.Normal muscle tone coordination. Non-focal Skin: Skin is warm and dry. No rash noted. Not diaphoretic. No erythema. No pallor. Psychiatric: Normal mood and affect. Behavior, judgment, thought content normal.  Lab Results  Component Value Date   WBC 8.2 04/21/2011   HGB 15.9 04/21/2011   HCT 46.2 04/21/2011   MCV 90.1 04/21/2011   PLT 300 04/21/2011   Lab Results  Component Value Date   CREATININE 0.96 04/21/2011   BUN 9 04/21/2011   NA 136 04/21/2011   K 3.7 04/21/2011   CL 103 04/21/2011   CO2 24 04/21/2011    Lab Results  Component Value Date   HGBA1C  04/11/2009    5.7 (NOTE) The ADA recommends the following therapeutic  goal for glycemic control related to Hgb A1c measurement: Goal of therapy: <6.5 Hgb A1c  Reference: American Diabetes Association: Clinical Practice Recommendations 2010, Diabetes Care, 2010, 33: (Suppl  1).   Lipid Panel     Component Value Date/Time   CHOL  04/11/2009 0605    153        ATP III CLASSIFICATION:  <200     mg/dL   Desirable  161-096  mg/dL   Borderline High  >=045    mg/dL   High          TRIG 74 04/11/2009 0605   HDL 28* 04/11/2009 0605   CHOLHDL 5.5 04/11/2009 0605   VLDL 15 04/11/2009 0605   LDLCALC * 04/11/2009 0605    110        Total Cholesterol/HDL:CHD Risk Coronary Heart Disease Risk Table                     Men   Women  1/2 Average Risk   3.4   3.3  Average Risk       5.0   4.4  2 X Average Risk   9.6   7.1  3 X Average Risk  23.4   11.0        Use the calculated Patient Ratio above and the CHD Risk Table to determine the patient's CHD Risk.        ATP III CLASSIFICATION (LDL):  <100     mg/dL   Optimal  161-096  mg/dL   Near or Above                    Optimal  130-159  mg/dL   Borderline  045-409  mg/dL   High  >811     mg/dL   Very High       Assessment and plan:   1. Health care maintenance  - COMPLETE METABOLIC PANEL WITH GFR - CBC with Differential - Lipid panel - TSH - Vitamin D 1,25 dihydroxy - HIV antibody (with reflex)  2. Hypertension, uncontrolled -lisinopril/hctz 20/25 daily,#90 with 3 refills. -dash diet.  3. GERD -omeprozole 20 mg, #30, one po q day with 3 refills  4. Allergic Rhinitis/conjunctivitis -Zyrtec 10 mg daily, #30 with 11 refills.  5. Obesity -Have discussed the need to lose weight and exercise  6. Have discussed the need for smoking cesstion and offered suggestions.      Return in about 1 month (around 07/15/2015).  The patient was given clear instructions to go to ER or return to medical center if symptoms don't improve, worsen or new problems develop. The patient verbalized understanding.     Henrietta Hoover FNP  06/14/2015, 9:35 AM

## 2015-06-14 NOTE — Patient Instructions (Signed)
DASH Eating Plan DASH stands for "Dietary Approaches to Stop Hypertension." The DASH eating plan is a healthy eating plan that has been shown to reduce high blood pressure (hypertension). Additional health benefits may include reducing the risk of type 2 diabetes mellitus, heart disease, and stroke. The DASH eating plan may also help with weight loss. WHAT DO I NEED TO KNOW ABOUT THE DASH EATING PLAN? For the DASH eating plan, you will follow these general guidelines:  Choose foods with a percent daily value for sodium of less than 5% (as listed on the food label).  Use salt-free seasonings or herbs instead of table salt or sea salt.  Check with your health care provider or pharmacist before using salt substitutes.  Eat lower-sodium products, often labeled as "lower sodium" or "no salt added."  Eat fresh foods.  Eat more vegetables, fruits, and low-fat dairy products.  Choose whole grains. Look for the word "whole" as the first word in the ingredient list.  Choose fish and skinless chicken or [place] more often than red meat. Limit fish, poultry, and meat to 6 oz (170 g) each day.  Limit sweets, desserts, sugars, and sugary drinks.  Choose heart-healthy fats.  Limit cheese to 1 oz (28 g) per day.  Eat more home-cooked food and less restaurant, buffet, and fast food.  Limit fried foods.  Cook foods using methods other than frying.  Limit canned vegetables. If you do use them, rinse them well to decrease the sodium.  When eating at a restaurant, ask that your food be prepared with less salt, or no salt if possible. WHAT FOODS CAN I EAT? Seek help from a dietitian for individual calorie needs. Grains Whole grain or whole wheat bread. Brown rice. Whole grain or whole wheat pasta. Quinoa, bulgur, and whole grain cereals. Low-sodium cereals. Corn or whole wheat flour tortillas. Whole grain cornbread. Whole grain crackers. Low-sodium crackers. Vegetables Fresh or frozen  vegetables (raw, steamed, roasted, or grilled). Low-sodium or reduced-sodium tomato and vegetable juices. Low-sodium or reduced-sodium tomato sauce and paste. Low-sodium or reduced-sodium canned vegetables.  Fruits All fresh, canned (in natural juice), or frozen fruits. Meat and Other Protein Products Ground beef (85% or leaner), grass-fed beef, or beef trimmed of fat. Skinless chicken or [place]. Ground chicken or [place]. Pork trimmed of fat. All fish and seafood. Eggs. Dried beans, peas, or lentils. Unsalted nuts and seeds. Unsalted canned beans. Dairy Low-fat dairy products, such as skim or 1% milk, 2% or reduced-fat cheeses, low-fat ricotta or cottage cheese, or plain low-fat yogurt. Low-sodium or reduced-sodium cheeses. Fats and Oils Tub margarines without trans fats. Light or reduced-fat mayonnaise and salad dressings (reduced sodium). Avocado. Safflower, olive, or canola oils. Natural peanut or almond butter. Other Unsalted popcorn and pretzels. The items listed above may not be a complete list of recommended foods or beverages. Contact your dietitian for more options. WHAT FOODS ARE NOT RECOMMENDED? Grains White bread. White pasta. White rice. Refined cornbread. Bagels and croissants. Crackers that contain trans fat. Vegetables Creamed or fried vegetables. Vegetables in a cheese sauce. Regular canned vegetables. Regular canned tomato sauce and paste. Regular tomato and vegetable juices. Fruits Dried fruits. Canned fruit in light or heavy syrup. Fruit juice. Meat and Other Protein Products Fatty cuts of meat. Ribs, chicken wings, bacon, sausage, bologna, salami, chitterlings, fatback, hot dogs, bratwurst, and packaged luncheon meats. Salted nuts and seeds. Canned beans with salt. Dairy Whole or 2% milk, cream, half-and-half, and cream cheese. Whole-fat or sweetened  yogurt. Full-fat cheeses or blue cheese. Nondairy creamers and whipped toppings. Processed cheese, cheese spreads, or cheese  curds. Condiments Onion and garlic salt, seasoned salt, table salt, and sea salt. Canned and packaged gravies. Worcestershire sauce. Tartar sauce. Barbecue sauce. Teriyaki sauce. Soy sauce, including reduced sodium. Steak sauce. Fish sauce. Oyster sauce. Cocktail sauce. Horseradish. Ketchup and mustard. Meat flavorings and tenderizers. Bouillon cubes. Hot sauce. Tabasco sauce. Marinades. Taco seasonings. Relishes. Fats and Oils Butter, stick margarine, lard, shortening, ghee, and bacon fat. Coconut, palm kernel, or palm oils. Regular salad dressings. Other Pickles and olives. Salted popcorn and pretzels. The items listed above may not be a complete list of foods and beverages to avoid. Contact your dietitian for more information. WHERE CAN I FIND MORE INFORMATION? National Heart, Lung, and Blood Institute: [employer] Document Released: 09/24/2011 Document Revised: 02/19/2014 Document Reviewed: 08/09/2013 [facility] Patient Information 2015 [place], [place]. This information is not intended to replace advice given to you by your health care provider. Make sure you discuss any questions you have with your health care provider.  Take blood pressure medicine once a day Also avoid lots of startches, sweets and fats and cholesterol. Try to exercise (walK) 5 out of 7 days a week. Try to follow dash diet above to help with BP control   Follow-up in one month for BP check. I have prescribed something for heartburn and allergies.

## 2015-06-15 LAB — COMPLETE METABOLIC PANEL WITH GFR
ALBUMIN: 3.9 g/dL (ref 3.6–5.1)
ALT: 30 U/L (ref 9–46)
AST: 22 U/L (ref 10–40)
Alkaline Phosphatase: 73 U/L (ref 40–115)
BUN: 10 mg/dL (ref 7–25)
CHLORIDE: 103 mmol/L (ref 98–110)
CO2: 22 mmol/L (ref 20–31)
CREATININE: 0.98 mg/dL (ref 0.60–1.35)
Calcium: 11.4 mg/dL — ABNORMAL HIGH (ref 8.6–10.3)
Glucose, Bld: 85 mg/dL (ref 65–99)
POTASSIUM: 4.4 mmol/L (ref 3.5–5.3)
SODIUM: 136 mmol/L (ref 135–146)
TOTAL PROTEIN: 6.5 g/dL (ref 6.1–8.1)
Total Bilirubin: 0.4 mg/dL (ref 0.2–1.2)

## 2015-06-15 LAB — LIPID PANEL
Cholesterol: 176 mg/dL (ref 125–200)
HDL: 34 mg/dL — ABNORMAL LOW (ref >=40)
LDL CALC: 121 mg/dL (ref <130)
Total CHOL/HDL Ratio: 5.2 Ratio — ABNORMAL HIGH (ref <=5.0)
Triglycerides: 106 mg/dL (ref <150)
VLDL: 21 mg/dL (ref <30)

## 2015-06-15 LAB — CBC WITH DIFFERENTIAL/PLATELET
Basophils Absolute: 0 10*3/uL (ref 0.0–0.1)
Basophils Relative: 0 % (ref 0–1)
EOS PCT: 2 % (ref 0–5)
Eosinophils Absolute: 0.1 10*3/uL (ref 0.0–0.7)
HEMATOCRIT: 47.3 % (ref 39.0–52.0)
Hemoglobin: 15.6 g/dL (ref 13.0–17.0)
LYMPHS ABS: 2.2 10*3/uL (ref 0.7–4.0)
LYMPHS PCT: 42 % (ref 12–46)
MCH: 30.5 pg (ref 26.0–34.0)
MCHC: 33 g/dL (ref 30.0–36.0)
MCV: 92.4 fL (ref 78.0–100.0)
MONO ABS: 0.8 10*3/uL (ref 0.1–1.0)
MONOS PCT: 15 % — AB (ref 3–12)
MPV: 9.4 fL (ref 8.6–12.4)
Neutro Abs: 2.1 10*3/uL (ref 1.7–7.7)
Neutrophils Relative %: 41 % — ABNORMAL LOW (ref 43–77)
Platelets: 294 10*3/uL (ref 150–400)
RBC: 5.12 MIL/uL (ref 4.22–5.81)
RDW: 14.1 % (ref 11.5–15.5)
WBC: 5.2 10*3/uL (ref 4.0–10.5)

## 2015-06-15 LAB — HIV ANTIBODY (ROUTINE TESTING W REFLEX): HIV 1&2 Ab, 4th Generation: NONREACTIVE

## 2015-06-15 LAB — TSH: TSH: 0.813 u[IU]/mL (ref 0.350–4.500)

## 2015-06-17 LAB — VITAMIN D 1,25 DIHYDROXY
VITAMIN D 1, 25 (OH) TOTAL: 92 pg/mL — AB (ref 18–72)
VITAMIN D3 1, 25 (OH): 92 pg/mL

## 2015-07-15 ENCOUNTER — Ambulatory Visit: Payer: No Typology Code available for payment source | Admitting: Family Medicine

## 2015-07-29 ENCOUNTER — Ambulatory Visit: Payer: No Typology Code available for payment source | Admitting: Family Medicine

## 2016-01-23 ENCOUNTER — Emergency Department (HOSPITAL_COMMUNITY): Payer: No Typology Code available for payment source

## 2016-01-23 ENCOUNTER — Encounter (HOSPITAL_COMMUNITY): Payer: Self-pay | Admitting: Emergency Medicine

## 2016-01-23 ENCOUNTER — Emergency Department (HOSPITAL_COMMUNITY)
Admission: EM | Admit: 2016-01-23 | Discharge: 2016-01-23 | Disposition: A | Discharge status: Home / Self Care | Payer: No Typology Code available for payment source | Attending: Emergency Medicine | Admitting: Emergency Medicine

## 2016-01-23 DIAGNOSIS — F17210 Nicotine dependence, cigarettes, uncomplicated: Secondary | ICD-10-CM | POA: Insufficient documentation

## 2016-01-23 DIAGNOSIS — R1030 Lower abdominal pain, unspecified: Secondary | ICD-10-CM | POA: Insufficient documentation

## 2016-01-23 DIAGNOSIS — Z79899 Other long term (current) drug therapy: Secondary | ICD-10-CM | POA: Insufficient documentation

## 2016-01-23 DIAGNOSIS — I10 Essential (primary) hypertension: Secondary | ICD-10-CM | POA: Insufficient documentation

## 2016-01-23 DIAGNOSIS — R112 Nausea with vomiting, unspecified: Secondary | ICD-10-CM | POA: Insufficient documentation

## 2016-01-23 LAB — COMPREHENSIVE METABOLIC PANEL
ALBUMIN: 3.8 g/dL (ref 3.5–5.0)
ALK PHOS: 71 U/L (ref 38–126)
ALT: 24 U/L (ref 17–63)
AST: 22 U/L (ref 15–41)
Anion gap: 10 (ref 5–15)
BILIRUBIN TOTAL: 0.7 mg/dL (ref 0.3–1.2)
BUN: 9 mg/dL (ref 6–20)
CO2: 22 mmol/L (ref 22–32)
Calcium: 11.8 mg/dL — ABNORMAL HIGH (ref 8.9–10.3)
Chloride: 104 mmol/L (ref 101–111)
Creatinine, Ser: 1.07 mg/dL (ref 0.61–1.24)
GFR calc Af Amer: >60 mL/min (ref >60)
GFR calc non Af Amer: >60 mL/min (ref >60)
GLUCOSE: 133 mg/dL — AB (ref 65–99)
Potassium: 4.2 mmol/L (ref 3.5–5.1)
SODIUM: 136 mmol/L (ref 135–145)
TOTAL PROTEIN: 6.8 g/dL (ref 6.5–8.1)

## 2016-01-23 LAB — CBC
HEMATOCRIT: 47.9 % (ref 39.0–52.0)
Hemoglobin: 16.4 g/dL (ref 13.0–17.0)
MCH: 31.2 pg (ref 26.0–34.0)
MCHC: 34.2 g/dL (ref 30.0–36.0)
MCV: 91.1 fL (ref 78.0–100.0)
PLATELETS: 258 10*3/uL (ref 150–400)
RBC: 5.26 MIL/uL (ref 4.22–5.81)
RDW: 13.5 % (ref 11.5–15.5)
WBC: 5.1 10*3/uL (ref 4.0–10.5)

## 2016-01-23 LAB — URINE MICROSCOPIC-ADD ON

## 2016-01-23 LAB — URINALYSIS, ROUTINE W REFLEX MICROSCOPIC
BILIRUBIN URINE: NEGATIVE
GLUCOSE, UA: NEGATIVE mg/dL
Hgb urine dipstick: NEGATIVE
KETONES UR: NEGATIVE mg/dL
NITRITE: NEGATIVE
PH: 6.5 (ref 5.0–8.0)
PROTEIN: 30 mg/dL — AB
Specific Gravity, Urine: 1.02 (ref 1.005–1.030)

## 2016-01-23 LAB — LIPASE, BLOOD: Lipase: 26 U/L (ref 11–51)

## 2016-01-23 MED ORDER — ONDANSETRON HCL 4 MG/2ML IJ SOLN
4.0000 mg | Freq: Once | INTRAMUSCULAR | Status: DC
  Filled 2016-01-23: qty 2

## 2016-01-23 MED ORDER — ONDANSETRON 4 MG PO TBDP
4.0000 mg | ORAL_TABLET | Freq: Once | ORAL | Status: AC
  Administered 2016-01-23: 4 mg via ORAL
  Filled 2016-01-23: qty 1

## 2016-01-23 MED ORDER — SODIUM CHLORIDE 0.9 % IV BOLUS (SEPSIS)
1000.0000 mL | Freq: Once | INTRAVENOUS | Status: AC
  Administered 2016-01-23: 1000 mL via INTRAVENOUS

## 2016-01-23 MED ORDER — IOPAMIDOL (ISOVUE-300) INJECTION 61%
INTRAVENOUS | Status: AC
  Administered 2016-01-23: 100 mL
  Filled 2016-01-23: qty 100

## 2016-01-23 MED ORDER — MORPHINE SULFATE (PF) 2 MG/ML IV SOLN
2.0000 mg | Freq: Once | INTRAVENOUS | Status: DC
  Filled 2016-01-23: qty 1

## 2016-01-23 MED ORDER — ONDANSETRON HCL 4 MG/2ML IJ SOLN: 4.0000 mg | Freq: Once | INTRAMUSCULAR | Status: DC

## 2016-01-23 MED ORDER — ONDANSETRON HCL 4 MG PO TABS: 4.0000 mg | ORAL_TABLET | Freq: Four times a day (QID) | ORAL | Status: DC

## 2016-01-23 NOTE — Discharge Instructions (Signed)
Abdominal Pain, Adult Follow-up with gastroenterology. Take Zofran as needed for nausea. Return for inability to tolerate fluids or increased abdominal pain. Many things can cause abdominal pain. Usually, abdominal pain is not caused by a disease and will improve without treatment. It can often be observed and treated at home. Your health care provider will do a physical exam and possibly order blood tests and X-rays to help determine the seriousness of your pain. However, in many cases, more time must pass before a clear cause of the pain can be found. Before that point, your health care provider may not know if you need more testing or further treatment. HOME CARE INSTRUCTIONS Monitor your abdominal pain for any changes. The following actions may help to alleviate any discomfort you are experiencing:  Only take over-the-counter or prescription medicines as directed by your health care provider.  Do not take laxatives unless directed to do so by your health care provider.  Try a clear liquid diet (broth, tea, or water) as directed by your health care provider. Slowly move to a bland diet as tolerated. SEEK MEDICAL CARE IF:  You have unexplained abdominal pain.  You have abdominal pain associated with nausea or diarrhea.  You have pain when you urinate or have a bowel movement.  You experience abdominal pain that wakes you in the night.  You have abdominal pain that is worsened or improved by eating food.  You have abdominal pain that is worsened with eating fatty foods.  You have a fever. SEEK IMMEDIATE MEDICAL CARE IF:  Your pain does not go away within 2 hours.  You keep throwing up (vomiting).  Your pain is felt only in portions of the abdomen, such as the right side or the left lower portion of the abdomen.  You pass bloody or black tarry stools. MAKE SURE YOU:  Understand these instructions.  Will watch your condition.  Will get help right away if you are not doing well  or get worse.   This information is not intended to replace advice given to you by your health care provider. Make sure you discuss any questions you have with your health care provider.   Document Released: 07/15/2005 Document Revised: 06/26/2015 Document Reviewed: 06/14/2013 Elsevier Interactive Patient Education 2016 [employer] Inc[employer]  Nausea and Vomiting Nausea is a sick feeling that often comes before throwing up (vomiting). Vomiting is a reflex where stomach contents come out of your mouth. Vomiting can cause severe loss of body fluids (dehydration). Children and elderly adults can become dehydrated quickly, especially if they also have diarrhea. Nausea and vomiting are symptoms of a condition or disease. It is important to find the cause of your symptoms. CAUSES   Direct irritation of the stomach lining. This irritation can result from increased acid production (gastroesophageal reflux disease), infection, food poisoning, taking certain medicines (such as nonsteroidal anti-inflammatory drugs), alcohol use, or tobacco use.  Signals from the brain.These signals could be caused by a headache, heat exposure, an inner ear disturbance, increased pressure in the brain from injury, infection, a tumor, or a concussion, pain, emotional stimulus, or metabolic problems.  An obstruction in the gastrointestinal tract (bowel obstruction).  Illnesses such as diabetes, hepatitis, gallbladder problems, appendicitis, kidney problems, cancer, sepsis, atypical symptoms of a heart attack, or eating disorders.  Medical treatments such as chemotherapy and radiation.  Receiving medicine that makes you sleep (general anesthetic) during surgery. DIAGNOSIS Your caregiver may ask for tests to be done if the problems do not improve  after a few days. Tests may also be done if symptoms are severe or if the reason for the nausea and vomiting is not clear. Tests may include:  Urine tests.  Blood tests.  Stool  tests.  Cultures (to look for evidence of infection).  X-rays or other imaging studies. Test results can help your caregiver make decisions about treatment or the need for additional tests. TREATMENT You need to stay well hydrated. Drink frequently but in small amounts.You may wish to drink water, sports drinks, clear broth, or eat frozen ice pops or gelatin dessert to help stay hydrated.When you eat, eating slowly may help prevent nausea.There are also some antinausea medicines that may help prevent nausea. HOME CARE INSTRUCTIONS   Take all medicine as directed by your caregiver.  If you do not have an appetite, do not force yourself to eat. However, you must continue to drink fluids.  If you have an appetite, eat a normal diet unless your caregiver tells you differently.  Eat a variety of complex carbohydrates (rice, wheat, potatoes, bread), lean meats, yogurt, fruits, and vegetables.  Avoid high-fat foods because they are more difficult to digest.  Drink enough water and fluids to keep your urine clear or pale yellow.  If you are dehydrated, ask your caregiver for specific rehydration instructions. Signs of dehydration may include:  Severe thirst.  Dry lips and mouth.  Dizziness.  Dark urine.  Decreasing urine frequency and amount.  Confusion.  Rapid breathing or pulse. SEEK IMMEDIATE MEDICAL CARE IF:   You have blood or brown flecks (like coffee grounds) in your vomit.  You have black or bloody stools.  You have a severe headache or stiff neck.  You are confused.  You have severe abdominal pain.  You have chest pain or trouble breathing.  You do not urinate at least once every 8 hours.  You develop cold or clammy skin.  You continue to vomit for longer than 24 to 48 hours.  You have a fever. MAKE SURE YOU:   Understand these instructions.  Will watch your condition.  Will get help right away if you are not doing well or get worse.   This  information is not intended to replace advice given to you by your health care provider. Make sure you discuss any questions you have with your health care provider.   Document Released: 10/05/2005 Document Revised: 12/28/2011 Document Reviewed: 03/04/2011 Elsevier Interactive Patient Education [employer]2016 Else[employer] Inc.

## 2016-01-23 NOTE — ED Notes (Signed)
Pt reports generalized lower abd pain starting yesterday. Pt reports last BM yesterday bu has not been able to go today. Pt alert x4. NAD at this time.

## 2016-01-23 NOTE — ED Notes (Signed)
Patient transported to X-ray 

## 2016-01-23 NOTE — ED Provider Notes (Signed)
CSN: [id number]     Arrival date & time 01/23/16  1114 History   First MD Initiated Contact with Patient 01/23/16 1444     Chief Complaint  Patient presents with  . Abdominal Pain   (Consider location/radiation/quality/duration/timing/severity/associated sxs/prior Treatment) Patient is a 45 y.o. male presenting with abdominal pain. The history is provided by the patient. No language interpreter was used.  Abdominal Pain Associated symptoms: nausea and vomiting   Associated symptoms: no fever     Charles Callahan is a 45 y.o male with a history of hypertension who presents for gradual onset worsening lower abdominal pain that began 3 days ago. Reports nausea and multiple episodes of vomiting since yesterday. States his last bowel movement was yesterday and is not aware if there was blood in the stool. Never had pain like this before. Denies any treatment prior to arrival. Denies any previous abdominal surgeries. Patient states he lifts his stepmom every day because she cannot walk. This is not new for him.  And eyes any fever, chills, diarrhea, dysuria, hematuria, testicular or penile pain, or urinary frequency.   Past Medical History  Diagnosis Date  . Hypertension    Past Surgical History  Procedure Laterality Date  . Eye surgery     Family History  Problem Relation Age of Onset  . Diabetes Mother   . Hypertension Mother    Social History  Substance Use Topics  . Smoking status: Current Every Day Smoker -- 0.25 packs/day    Types: Cigarettes  . Smokeless tobacco: None  . Alcohol Use: No    Review of Systems  Constitutional: Negative for fever.  Gastrointestinal: Positive for nausea, vomiting and abdominal pain.  All other systems reviewed and are negative.     Allergies  Morphine and related  Home Medications   Prior to Admission medications   Medication Sig Start Date End Date Taking? Authorizing Provider  albuterol (PROVENTIL HFA;VENTOLIN HFA) 108 (90 BASE) MCG/ACT  inhaler Inhale 1-2 puffs into the lungs every 6 (six) hours as needed for wheezing. 02/27/15  Yes Gilda Crease, MD  cetirizine (ZYRTEC) 10 MG tablet Take 1 tablet (10 mg total) by mouth daily. 06/14/15  Yes Henrietta Hoover, NP  hydrochlorothiazide (HYDRODIURIL) 25 MG tablet Take 25 mg by mouth daily.   Yes Historical Provider, MD  HYDROcodone-homatropine (HYCODAN) 5-1.5 MG/5ML syrup Take 5 mLs by mouth every 6 (six) hours as needed for cough. 02/27/15  Yes Gilda Crease, MD  lisinopril-hydrochlorothiazide (PRINZIDE,ZESTORETIC) 20-25 MG per tablet Take 1 tablet by mouth daily. 06/14/15  Yes Henrietta Hoover, NP  omeprazole (PRILOSEC) 20 MG capsule Take 1 capsule (20 mg total) by mouth daily. 06/14/15  Yes Henrietta Hoover, NP  ibuprofen (ADVIL,MOTRIN) 600 MG tablet Take 1 tablet (600 mg total) by mouth every 8 (eight) hours as needed for pain or fever. Patient not taking: Reported on 06/14/2015 11/09/12   Adlih Moreno-Coll, MD  ondansetron (ZOFRAN) 4 MG tablet Take 1 tablet (4 mg total) by mouth every 6 (six) hours. 01/23/16   My Madariaga Patel-Mills, PA-C   BP 167/76 mmHg  Pulse 56  Temp(Src) 98.2 F (36.8 C) (Oral)  Resp 18  SpO2 98% Physical Exam  Constitutional: He is oriented to person, place, and time. He appears well-developed and well-nourished.  HENT:  Head: Normocephalic and atraumatic.  Eyes: Conjunctivae are normal.  Neck: Normal range of motion. Neck supple.  Cardiovascular: Normal rate, regular rhythm and normal heart sounds.   Pulmonary/Chest: Effort normal and  breath sounds normal.  Abdominal: Soft. Normal appearance. He exhibits no distension. There is tenderness in the suprapubic area. There is no rebound, no guarding and no CVA tenderness.    Soft and nondistended abdomen. Mild suprapubic tenderness to palpation. No guarding or rebound.  Musculoskeletal: Normal range of motion.  Neurological: He is alert and oriented to person, place, and time.  Skin: Skin is  warm and dry.  Nursing note and vitals reviewed.   ED Course  Procedures (including critical care time) Labs Review Labs Reviewed  COMPREHENSIVE METABOLIC PANEL - Abnormal; Notable for the following:    Glucose, Bld 133 (*)    Calcium 11.8 (*)    All other components within normal limits  URINALYSIS, ROUTINE W REFLEX MICROSCOPIC (NOT AT [facility]) - Abnormal; Notable for the following:    Protein, ur 30 (*)    Leukocytes, UA MODERATE (*)    All other components within normal limits  URINE MICROSCOPIC-ADD ON - Abnormal; Notable for the following:    Squamous Epithelial / LPF 0-5 (*)    Bacteria, UA FEW (*)    Casts HYALINE CASTS (*)    All other components within normal limits  URINE CULTURE  LIPASE, BLOOD  CBC    Imaging Review Dg Abd 1 View  01/23/2016  CLINICAL DATA:  Lower abdominal pain, nausea for 2 days EXAM: ABDOMEN - 1 VIEW COMPARISON:  04/21/2011 FINDINGS: Mild small bowel gas is noted without small bowel distension suspicious for mild ileus or enteritis. No air-fluid levels are noted. Moderate gas noted in transverse colon. Pelvic phleboliths are noted. IMPRESSION: There is some intraluminal small bowel gas without significant small bowel distension. Mild ileus or enteritis cannot be excluded. Moderate gas noted in transverse colon. No small bowel air-fluid levels. Electronically Signed   By: Natasha Mead M.D.   On: 01/23/2016 16:05   Ct Abdomen Pelvis W Contrast  01/23/2016  CLINICAL DATA:  Lower abdominal pain. EXAM: CT ABDOMEN AND PELVIS WITH CONTRAST TECHNIQUE: Multidetector CT imaging of the abdomen and pelvis was performed using the standard protocol following bolus administration of intravenous contrast. CONTRAST:  100 mL ISOVUE-300 IOPAMIDOL (ISOVUE-300) INJECTION 61% COMPARISON:  Abdominal films earlier today FINDINGS: Lower chest:  Mild atelectasis at both lung bases. Hepatobiliary: The liver, gallbladder and bile ducts are normal. Pancreas: No mass, inflammatory changes, or  other significant abnormality. Spleen: Within normal limits in size and appearance. Adrenals/Urinary Tract: No masses identified. No evidence of hydronephrosis. No calculi. The bladder is unremarkable. Stomach/Bowel: No evidence of obstruction, inflammatory process, or abnormal fluid collections. No free intraperitoneal air. The appendix is normal. Vascular/Lymphatic: No pathologically enlarged lymph nodes. No evidence of abdominal aortic aneurysm. Reproductive: No mass or other significant abnormality. Other: No abscess.  No hernias are seen. Musculoskeletal: Spondylosis present at L5-S1. No bone lesions identified. IMPRESSION: No acute findings in the abdomen or pelvis. Electronically Signed   By: Irish Lack M.D.   On: 01/23/2016 18:30   I have personally reviewed and evaluated these images and lab results as part of my medical decision-making.   EKG Interpretation None      MDM   Final diagnoses:  Suprapubic abdominal pain, unspecified laterality  Non-intractable vomiting with nausea, vomiting of unspecified type  Patient presents with generalized lower abdominal pain that began 3 days ago. Also reports nausea and vomiting. No diarrhea or constipation. Last bowel movement was yesterday. Vital signs stable. Patient well-appearing and in no acute distress. Urinalysis shows moderate leukocytes with negative nitrites  and 6-30 WBCs. Urine culture pending. I do not believe the patient needs treatment for UTI at this time. Otherwise, labs are unremarkable. Abdominal x-ray shows some intraluminal small bowel gas without significant abdominal distention. He has a mild ileus or enteritis they cannot be excluded. CT abdomen was obtained due to abdominal x-ray findings. It is negative for appendicitis, constipation, bowel obstruction, ileus, or inflammatory process. I do not believe the patient needs treatment. I discussed return precautions with the patient as well as follow-up with GI. Patient agrees  with plan.       Catha GosselinHanna Patel-Mills, PA-C 01/24/16 1516  Vanetta MuldersScott Zackowski, MD 01/25/16 [phone]

## 2016-01-25 LAB — URINE CULTURE
Culture: 40000 — AB
Special Requests: NORMAL

## 2016-02-10 ENCOUNTER — Ambulatory Visit: Payer: No Typology Code available for payment source | Admitting: Family Medicine

## 2016-02-18 ENCOUNTER — Ambulatory Visit: Payer: No Typology Code available for payment source | Admitting: Family Medicine

## 2017-01-03 ENCOUNTER — Encounter (HOSPITAL_COMMUNITY): Payer: Self-pay | Admitting: *Deleted

## 2017-01-03 ENCOUNTER — Emergency Department (HOSPITAL_COMMUNITY)
Admission: EM | Admit: 2017-01-03 | Discharge: 2017-01-03 | Disposition: A | Discharge status: Home / Self Care | Payer: No Typology Code available for payment source | Attending: Emergency Medicine | Admitting: Emergency Medicine

## 2017-01-03 DIAGNOSIS — F17210 Nicotine dependence, cigarettes, uncomplicated: Secondary | ICD-10-CM | POA: Insufficient documentation

## 2017-01-03 DIAGNOSIS — I10 Essential (primary) hypertension: Secondary | ICD-10-CM | POA: Insufficient documentation

## 2017-01-03 DIAGNOSIS — Z8673 Personal history of transient ischemic attack (TIA), and cerebral infarction without residual deficits: Secondary | ICD-10-CM | POA: Insufficient documentation

## 2017-01-03 DIAGNOSIS — R21 Rash and other nonspecific skin eruption: Secondary | ICD-10-CM

## 2017-01-03 DIAGNOSIS — Z79899 Other long term (current) drug therapy: Secondary | ICD-10-CM | POA: Insufficient documentation

## 2017-01-03 MED ORDER — PERMETHRIN 5 % EX CREA: TOPICAL_CREAM | CUTANEOUS | 0 refills | Status: DC

## 2017-01-03 NOTE — ED Triage Notes (Signed)
Pt with itching rash/bites.  Pt noticed this "a little" yesterday when he was at the park and then this am when he woke up he had more affected areas.  Pt denies any other family members having same symptoms

## 2017-01-03 NOTE — ED Provider Notes (Signed)
[facility]-EMERGENCY DEPT Provider Note   CSN: [id number] Arrival date & time: 01/03/17  1409  By signing my name below, I, Rosario AdieWilliam Andrew Hiatt, attest that this documentation has been prepared under the direction and in the presence of [facility] Park HospitalJaime Emmerie Battaglia, PA-C.  Electronically Signed: Rosario AdieWilliam Andrew Hiatt, ED Scribe. 01/03/17. 3:53 PM.  History   Chief Complaint Chief Complaint  Patient presents with  . Rash   The history is provided by the patient. No language interpreter was used.    HPI Comments: Charles Callahan is a 46 y.o. male with a PMHx of HTN, obesity, who presents to the Emergency Department complaining of gradually spreading, pruritic rash to his bilateral upper extremities beginning yesterday. Per pt, he was at the park yesterday when he first noticed his rash and this morning he woke up and his rash had worsened. No one else who was with him at the park is experiencing similar symptoms. Pt denies any known tick, arachnid, or insect bites. No new soaps, lotions, detergents, foods, animals, or medications. Pt additionally notes that his daughter currently has scabies but it looks different and is just in the webspaces of her fingers. He denies sore throat, trouble breathing, or any other associated symptoms.   Past Medical History:  Diagnosis Date  . Hypertension    Patient Active Problem List   Diagnosis Date Noted  . OBESITY 05/13/2009  . HYPERTENSION 05/13/2009  . STROKE 05/13/2009  . BACK PAIN 05/13/2009  . SHORTNESS OF BREATH 05/13/2009  . CHEST PAIN 05/13/2009   Past Surgical History:  Procedure Laterality Date  . EYE SURGERY      Home Medications    Prior to Admission medications   Medication Sig Start Date End Date Taking? Authorizing Provider  albuterol (PROVENTIL HFA;VENTOLIN HFA) 108 (90 BASE) MCG/ACT inhaler Inhale 1-2 puffs into the lungs every 6 (six) hours as needed for wheezing. 02/27/15   Gilda Creasehristopher J Pollina, MD  cetirizine (ZYRTEC) 10 MG tablet Take 1 tablet  (10 mg total) by mouth daily. 06/14/15   Henrietta HooverLinda C Bernhardt, NP  hydrochlorothiazide (HYDRODIURIL) 25 MG tablet Take 25 mg by mouth daily.    Historical Provider, MD  HYDROcodone-homatropine (HYCODAN) 5-1.5 MG/5ML syrup Take 5 mLs by mouth every 6 (six) hours as needed for cough. 02/27/15   Gilda Creasehristopher J Pollina, MD  ibuprofen (ADVIL,MOTRIN) 600 MG tablet Take 1 tablet (600 mg total) by mouth every 8 (eight) hours as needed for pain or fever. Patient not taking: Reported on 06/14/2015 11/09/12   Christin FudgeAdlih Moreno-Coll, MD  lisinopril-hydrochlorothiazide (PRINZIDE,ZESTORETIC) 20-25 MG per tablet Take 1 tablet by mouth daily. 06/14/15   Henrietta HooverLinda C Bernhardt, NP  omeprazole (PRILOSEC) 20 MG capsule Take 1 capsule (20 mg total) by mouth daily. 06/14/15   Henrietta HooverLinda C Bernhardt, NP  ondansetron (ZOFRAN) 4 MG tablet Take 1 tablet (4 mg total) by mouth every 6 (six) hours. 01/23/16   Hanna Patel-Mills, PA-C  permethrin (ELIMITE) 5 % cream Apply to affected area once 01/03/17   Chase PicketJaime Pilcher Brei Pociask, PA-C   Family History Family History  Problem Relation Age of Onset  . Diabetes Mother   . Hypertension Mother    Social History Social History  Substance Use Topics  . Smoking status: Current Every Day Smoker    Packs/day: 0.25    Types: Cigarettes  . Smokeless tobacco: Never Used  . Alcohol use No   Allergies   Morphine and related  Review of Systems Review of Systems  HENT: Negative for sore throat  and trouble swallowing.   Skin: Positive for rash.   Physical Exam Updated Vital Signs BP (!) 168/92   Pulse 62   Temp 98.4 F (36.9 C) (Oral)   Resp 16   SpO2 100%   Physical Exam  Constitutional: He appears well-developed and well-nourished. No distress.  HENT:  Head: Normocephalic and atraumatic.  Neck: Neck supple.  Cardiovascular: Normal rate, regular rhythm and normal heart sounds.   No murmur heard. Pulmonary/Chest: Effort normal and breath sounds normal. No respiratory distress. He has no wheezes. He  has no rales.  Musculoskeletal: Normal range of motion.  Neurological: He is alert.  Skin: Skin is warm and dry. Rash noted.  Scattered erythematous papules to bilateral upper extremities. No lesions to palms.   Nursing note and vitals reviewed.  ED Treatments / Results  DIAGNOSTIC STUDIES: Oxygen Saturation is 100% on RA, normal by my interpretation.   COORDINATION OF CARE: 3:53 PM-Discussed next steps with pt. Pt verbalized understanding and is agreeable with the plan.   Labs (all labs ordered are listed, but only abnormal results are displayed) Labs Reviewed - No data to display  EKG  EKG Interpretation None      Radiology No results found.  Procedures Procedures  Medications Ordered in ED Medications - No data to display  Initial Impression / Assessment and Plan / ED Course  I have reviewed the triage vital signs and the nursing notes.  Pertinent labs & imaging results that were available during my care of the patient were reviewed by me and considered in my medical decision making (see chart for details).     Charles Callahan is a 46 y.o. male who presents to the Emergency Department complaining of rash to bilateral upper extremities. Rash appears more consistent with insect bites, but he does have a daughter with scabies. Given exposure to scabies, will give Elimite creme. Benadryl as needed for pruritis. Patient denies any difficulty breathing or swallowing. Pt has a patent airway without stridor and is handling secretions without difficulty. No blisters, no pustules, no warmth, no draining sinus tracts, no superficial abscesses, no bullous impetigo, no vesicles, no desquamation, no target lesions with dusky purpura or a central bulla. Not tender to touch. No concern for superimposed infection. No concern for SJS, TEN, TSS, tick borne illness, syphilis or other life-threatening condition. PCP follow up if symptoms persist. All questions answered.   Final Clinical  Impressions(s) / ED Diagnoses   Final diagnoses:  Rash   New Prescriptions Discharge Medication List as of 01/03/2017  3:56 PM    START taking these medications   Details  permethrin (ELIMITE) 5 % cream Apply to affected area once, Print       I personally performed the services described in this documentation, which was scribed in my presence. The recorded information has been reviewed and is accurate.     [facility] Porschia Willbanks, PA-C 01/03/17 1623    Shaune Pollack, MD 01/04/17 [phone]

## 2017-01-03 NOTE — ED Notes (Signed)
Bed: WTR8 Expected date:  Expected time:  Means of arrival:  Comments: 

## 2017-01-03 NOTE — Discharge Instructions (Signed)
It was my pleasure taking care of you today!  Benadryl as needed for itching.  Topical hydrocortisone cream as needed. Apply Permethrin from neck down tonight - shower and rinse off tomorrow morning.  Please follow-up with your primary doctor in 3-5 days for discussion of your diagnoses and further evaluation after today's visit if symptoms have not resolved; Please return to the ER for new or worsening symptoms, signs of infection or any additional concerns.

## 2017-03-04 ENCOUNTER — Emergency Department (HOSPITAL_COMMUNITY)
Admission: EM | Admit: 2017-03-04 | Discharge: 2017-03-04 | Disposition: A | Discharge status: Home / Self Care | Payer: Self-pay | Attending: Emergency Medicine | Admitting: Emergency Medicine

## 2017-03-04 ENCOUNTER — Encounter (HOSPITAL_COMMUNITY): Payer: Self-pay | Admitting: Emergency Medicine

## 2017-03-04 ENCOUNTER — Emergency Department (HOSPITAL_COMMUNITY): Payer: Self-pay

## 2017-03-04 DIAGNOSIS — R1013 Epigastric pain: Secondary | ICD-10-CM | POA: Insufficient documentation

## 2017-03-04 DIAGNOSIS — Z8673 Personal history of transient ischemic attack (TIA), and cerebral infarction without residual deficits: Secondary | ICD-10-CM | POA: Insufficient documentation

## 2017-03-04 DIAGNOSIS — F17210 Nicotine dependence, cigarettes, uncomplicated: Secondary | ICD-10-CM | POA: Insufficient documentation

## 2017-03-04 LAB — COMPREHENSIVE METABOLIC PANEL
ALK PHOS: 82 U/L (ref 38–126)
ALT: 30 U/L (ref 17–63)
AST: 41 U/L (ref 15–41)
Albumin: 4.2 g/dL (ref 3.5–5.0)
Anion gap: 7 (ref 5–15)
BUN: 10 mg/dL (ref 6–20)
CALCIUM: 11.2 mg/dL — AB (ref 8.9–10.3)
CO2: 25 mmol/L (ref 22–32)
CREATININE: 0.99 mg/dL (ref 0.61–1.24)
Chloride: 103 mmol/L (ref 101–111)
Glucose, Bld: 88 mg/dL (ref 65–99)
Potassium: 3.8 mmol/L (ref 3.5–5.1)
Sodium: 135 mmol/L (ref 135–145)
Total Bilirubin: 0.9 mg/dL (ref 0.3–1.2)
Total Protein: 7.1 g/dL (ref 6.5–8.1)

## 2017-03-04 LAB — CBC WITH DIFFERENTIAL/PLATELET
Basophils Absolute: 0 10*3/uL (ref 0.0–0.1)
Basophils Relative: 0 %
EOS ABS: 0.1 10*3/uL (ref 0.0–0.7)
EOS PCT: 2 %
HCT: 46.4 % (ref 39.0–52.0)
Hemoglobin: 15.9 g/dL (ref 13.0–17.0)
LYMPHS ABS: 2.1 10*3/uL (ref 0.7–4.0)
Lymphocytes Relative: 28 %
MCH: 31.7 pg (ref 26.0–34.0)
MCHC: 34.3 g/dL (ref 30.0–36.0)
MCV: 92.6 fL (ref 78.0–100.0)
Monocytes Absolute: 0.8 10*3/uL (ref 0.1–1.0)
Monocytes Relative: 10 %
Neutro Abs: 4.4 10*3/uL (ref 1.7–7.7)
Neutrophils Relative %: 60 %
PLATELETS: 239 10*3/uL (ref 150–400)
RBC: 5.01 MIL/uL (ref 4.22–5.81)
RDW: 14.4 % (ref 11.5–15.5)
WBC: 7.4 10*3/uL (ref 4.0–10.5)

## 2017-03-04 LAB — I-STAT TROPONIN, ED: TROPONIN I, POC: 0.01 ng/mL (ref 0.00–0.08)

## 2017-03-04 LAB — LIPASE, BLOOD: LIPASE: 22 U/L (ref 11–51)

## 2017-03-04 MED ORDER — GI COCKTAIL ~~LOC~~
30.0000 mL | Freq: Once | ORAL | Status: AC
  Administered 2017-03-04: 30 mL via ORAL
  Filled 2017-03-04: qty 30

## 2017-03-04 MED ORDER — SUCRALFATE 1 GM/10ML PO SUSP: 1.0000 g | Freq: Three times a day (TID) | ORAL | 0 refills | Status: DC

## 2017-03-04 MED ORDER — DICYCLOMINE HCL 20 MG PO TABS: 20.0000 mg | ORAL_TABLET | Freq: Two times a day (BID) | ORAL | 0 refills | Status: DC

## 2017-03-04 NOTE — ED Triage Notes (Signed)
Pt c/o abdominal discomfort and bloating x 2 weeks. Pt states he will eat and then has bloating. Pt states he had one episode of vomiting and abnormal stools over the past several weeks.

## 2017-03-04 NOTE — ED Notes (Signed)
Patient is alert and oriented x3.  He was given DC instructions and follow up visit instructions.  Patient gave verbal understanding.  He was DC ambulatory under his own power to home.  V/S stable.  He was not showing any signs of distress on DC 

## 2017-03-04 NOTE — ED Provider Notes (Signed)
Emergency Department Provider Note   I have reviewed the triage vital signs and the nursing notes.   HISTORY  Chief Complaint Abdominal Pain; Bloated; and Diarrhea   HPI Charles Callahan is a 46 y.o. male with PMH of HTN presents to the emergency peripheral evaluation of intermittent epigastric abdominal discomfort with nausea. Patient has had this before and was seen in the emergency department which point he was apparently diagnosed with reflux and referred to GI but has not followed up on an appointment. He reports epigastric discomfort constantly for the past week. States he has some radiation of pain up the middle of his chest. No dyspnea. Has some nausea but no vomiting. Continues to have bowel movements. Patient states he takes a medication for GERD but cannot recall the name of the medicine. Symptoms are worse with food and worse when lying flat at night. No alleviating factors.    Past Medical History:  Diagnosis Date  . Hypertension     Patient Active Problem List   Diagnosis Date Noted  . OBESITY 05/13/2009  . HYPERTENSION 05/13/2009  . STROKE 05/13/2009  . BACK PAIN 05/13/2009  . SHORTNESS OF BREATH 05/13/2009  . CHEST PAIN 05/13/2009    Past Surgical History:  Procedure Laterality Date  . EYE SURGERY      Current Outpatient Rx  . Order #: [id number] Class: Print  . Order #: [id number] Class: Normal  . Order #: [id number] Class: Historical Med  . Order #: [id number] Class: Normal  . Order #: [id number] Class: Print  . Order #: [id number] Class: Print  . Order #: [id number] Class: Normal  . Order #: [id number] Class: Normal  . Order #: [id number] Class: Print  . Order #: [id number] Class: Print  . Order #: [id number] Class: Print    Allergies Morphine and related  Family History  Problem Relation Age of Onset  . Diabetes Mother   . Hypertension Mother     Social History Social History  Substance Use Topics  . Smoking status: Current Every Day Smoker    Packs/day: 0.25      Types: Cigarettes  . Smokeless tobacco: Never Used  . Alcohol use No    Review of Systems  Constitutional: No fever/chills Eyes: No visual changes. ENT: No sore throat. Cardiovascular: Denies chest pain. Respiratory: Denies shortness of breath. Gastrointestinal: Positive epigastric abdominal pain. Positive nausea, no vomiting.  No diarrhea.  No constipation. Genitourinary: Negative for dysuria. Musculoskeletal: Negative for back pain. Skin: Negative for rash. Neurological: Negative for headaches, focal weakness or numbness.  10-point ROS otherwise negative.  ____________________________________________   PHYSICAL EXAM:  VITAL SIGNS: ED Triage Vitals  Enc Vitals Group     BP 03/04/17 0925 (!) 173/114     Pulse Rate 03/04/17 0925 92     Resp 03/04/17 0925 16     Temp 03/04/17 0925 98.1 F (36.7 C)     Temp Source 03/04/17 0925 Oral     SpO2 03/04/17 0925 98 %     Pain Score 03/04/17 0929 7    Constitutional: Alert and oriented. Well appearing and in no acute distress. Eyes: Conjunctivae are normal. Head: Atraumatic. Nose: No congestion/rhinnorhea. Mouth/Throat: Mucous membranes are moist.  Oropharynx non-erythematous. Neck: No stridor.   Cardiovascular: Normal rate, regular rhythm. Good peripheral circulation. Grossly normal heart sounds.   Respiratory: Normal respiratory effort.  No retractions. Lungs CTAB. Gastrointestinal: Soft and with mild epigastric discomfort. No distention.  Musculoskeletal: No lower extremity tenderness nor edema. No gross deformities of extremities. Neurologic:  Normal speech and language.  No gross focal neurologic deficits are appreciated.  Skin:  Skin is warm, dry and intact. No rash noted. Psychiatric: Mood and affect are normal. Speech and behavior are normal.  ____________________________________________   LABS (all labs ordered are listed, but only abnormal results are displayed)  Labs Reviewed  COMPREHENSIVE METABOLIC PANEL  - Abnormal; Notable for the following:       Result Value   Calcium 11.2 (*)    All other components within normal limits  LIPASE, BLOOD  CBC WITH DIFFERENTIAL/PLATELET  I-STAT TROPOININ, ED   ____________________________________________  EKG   EKG Interpretation  Date/Time:  Thursday Mar 04 2017 11:22:38 EDT Ventricular Rate:  63 PR Interval:    QRS Duration: 122 QT Interval:  437 QTC Calculation: 448 R Axis:   46 Text Interpretation:  Sinus arrhythmia Nonspecific intraventricular conduction delay Borderline ST elevation, anterior leads Similar to prior. No STEMI.  Confirmed by Alona Bene [phone]) on 03/04/2017 11:32:54 AM       ____________________________________________  RADIOLOGY  Dg Chest 2 View  Result Date: 03/04/2017 CLINICAL DATA:  Worsening mid chest pain for the past week with some shortness of breath and upper abdominal pain. Smoker. EXAM: CHEST  2 VIEW COMPARISON:  11/09/2012 FINDINGS: The cardiomediastinal silhouette is within normal limits. There is mild peribronchial thickening, and there is a 5 mm nodular focus in the right mid lung which is new. No lobar consolidation, overt pulmonary edema, pleural effusion, or pneumothorax is identified. No acute osseous abnormality is seen. IMPRESSION: Mild bronchitic changes with minimal nodularity in the right midlung. Follow-up radiographs are recommended after the patient's acute illness resolves. Electronically Signed   By: Sebastian Ache M.D.   On: 03/04/2017 11:24    ____________________________________________   PROCEDURES  Procedure(s) performed:   Procedures  None ____________________________________________   INITIAL IMPRESSION / ASSESSMENT AND PLAN / ED COURSE  Pertinent labs & imaging results that were available during my care of the patient were reviewed by me and considered in my medical decision making (see chart for details).  Patient resents to the emergency room in for evaluation of  epigastric abdominal discomfort with nausea over the past week. He has intermittently had these symptoms and has been referred to gastroenterology but has not seen them yet. Some of this pain does radiate into the chest. Plan to initiate ACS w/u including EKG, CXR, and troponin along with baseline labs. Symptoms are most consistent with reflux disease. Patient with no abdominal tenderness or other findings/features that require CT imaging of the abdomen at this time. Plan for GI cocktail pending lab and imaging results.   Patient feeling much better after GI cocktail. Low suspicion for ACS in the setting on normal troponin and EKG. Plan for Carafate and Bentyl at home. Patient reports being on a PPI already. Will follow up with PCP and consider GI referral PRN. Pateitn verbalizes understanding and agreement with the plan at discharge.   At this time, I do not feel there is any life-threatening condition present. I have reviewed and discussed all results (EKG, imaging, lab, urine as appropriate), exam findings with patient. I have reviewed nursing notes and appropriate previous records.  I feel the patient is safe to be discharged home without further emergent workup. Discussed usual and customary return precautions. Patient and family (if present) verbalize understanding and are comfortable with this plan.  Patient will follow-up with their primary care provider. If they do not have a primary care provider, information for follow-up has been provided  to them. All questions have been answered.  ____________________________________________  FINAL CLINICAL IMPRESSION(S) / ED DIAGNOSES  Final diagnoses:  Epigastric pain     MEDICATIONS GIVEN DURING THIS VISIT:  Medications  gi cocktail (Maalox,Lidocaine,Donnatal) (30 mLs Oral Given 03/04/17 1126)     NEW OUTPATIENT MEDICATIONS STARTED DURING THIS VISIT:  Discharge Medication List as of 03/04/2017 12:58 PM    START taking these medications    Details  dicyclomine (BENTYL) 20 MG tablet Take 1 tablet (20 mg total) by mouth 2 (two) times daily., Starting Thu 03/04/2017, Print    sucralfate (CARAFATE) 1 GM/10ML suspension Take 10 mLs (1 g total) by mouth 4 (four) times daily -  with meals and at bedtime., Starting Thu 03/04/2017, Print         Note:  This document was prepared using Dragon voice recognition software and may include unintentional dictation errors.  Alona BeneJoshua Alakai Macbride, MD Emergency Medicine   Baron Parmelee, Arlyss RepressJoshua G, MD 03/05/17 1037

## 2017-03-04 NOTE — Discharge Instructions (Signed)

## 2017-09-20 ENCOUNTER — Encounter (HOSPITAL_COMMUNITY): Payer: Self-pay

## 2017-09-20 ENCOUNTER — Other Ambulatory Visit: Payer: Self-pay

## 2017-09-20 ENCOUNTER — Emergency Department (HOSPITAL_COMMUNITY)
Admission: EM | Admit: 2017-09-20 | Discharge: 2017-09-20 | Disposition: A | Discharge status: Home / Self Care | Payer: No Typology Code available for payment source | Attending: Emergency Medicine | Admitting: Emergency Medicine

## 2017-09-20 DIAGNOSIS — R112 Nausea with vomiting, unspecified: Secondary | ICD-10-CM | POA: Insufficient documentation

## 2017-09-20 DIAGNOSIS — Z79899 Other long term (current) drug therapy: Secondary | ICD-10-CM | POA: Insufficient documentation

## 2017-09-20 DIAGNOSIS — F17210 Nicotine dependence, cigarettes, uncomplicated: Secondary | ICD-10-CM | POA: Insufficient documentation

## 2017-09-20 DIAGNOSIS — I10 Essential (primary) hypertension: Secondary | ICD-10-CM | POA: Insufficient documentation

## 2017-09-20 LAB — COMPREHENSIVE METABOLIC PANEL
ALBUMIN: 4.1 g/dL (ref 3.5–5.0)
ALK PHOS: 74 U/L (ref 38–126)
ALT: 17 U/L (ref 17–63)
AST: 19 U/L (ref 15–41)
Anion gap: 6 (ref 5–15)
BUN: 13 mg/dL (ref 6–20)
CALCIUM: 11.3 mg/dL — AB (ref 8.9–10.3)
CHLORIDE: 107 mmol/L (ref 101–111)
CO2: 24 mmol/L (ref 22–32)
CREATININE: 0.95 mg/dL (ref 0.61–1.24)
GFR calc non Af Amer: >60 mL/min (ref >60)
GLUCOSE: 108 mg/dL — AB (ref 65–99)
Potassium: 3.9 mmol/L (ref 3.5–5.1)
SODIUM: 137 mmol/L (ref 135–145)
Total Bilirubin: 0.8 mg/dL (ref 0.3–1.2)
Total Protein: 7.2 g/dL (ref 6.5–8.1)

## 2017-09-20 LAB — URINALYSIS, ROUTINE W REFLEX MICROSCOPIC
BILIRUBIN URINE: NEGATIVE
GLUCOSE, UA: NEGATIVE mg/dL
HGB URINE DIPSTICK: NEGATIVE
KETONES UR: NEGATIVE mg/dL
Nitrite: NEGATIVE
PH: 5 (ref 5.0–8.0)
Protein, ur: NEGATIVE mg/dL
Specific Gravity, Urine: 1.02 (ref 1.005–1.030)

## 2017-09-20 LAB — CBC
HCT: 45.4 % (ref 39.0–52.0)
Hemoglobin: 15.8 g/dL (ref 13.0–17.0)
MCH: 31.9 pg (ref 26.0–34.0)
MCHC: 34.8 g/dL (ref 30.0–36.0)
MCV: 91.5 fL (ref 78.0–100.0)
PLATELETS: 252 10*3/uL (ref 150–400)
RBC: 4.96 MIL/uL (ref 4.22–5.81)
RDW: 13.5 % (ref 11.5–15.5)
WBC: 7.5 10*3/uL (ref 4.0–10.5)

## 2017-09-20 LAB — LIPASE, BLOOD: LIPASE: 32 U/L (ref 11–51)

## 2017-09-20 MED ORDER — ONDANSETRON 4 MG PO TBDP
4.0000 mg | ORAL_TABLET | Freq: Once | ORAL | Status: AC | PRN
  Administered 2017-09-20: 4 mg via ORAL
  Filled 2017-09-20: qty 1

## 2017-09-20 MED ORDER — ONDANSETRON HCL 4 MG PO TABS: 4.0000 mg | ORAL_TABLET | Freq: Four times a day (QID) | ORAL | 0 refills | Status: DC

## 2017-09-20 NOTE — ED Provider Notes (Signed)
[facility]-EMERGENCY DEPT Provider Note   CSN: [id number] Arrival date & time: 09/20/17  0746     History   Chief Complaint Chief Complaint  Patient presents with  . Otalgia  . Emesis  . Diarrhea  . Abdominal Pain    HPI Charles Callahan is a 46 y.o. male.  46 year old male with history of hypertension presents nausea, vomiting, and diarrhea.  He reports that symptoms started yesterday evening.  He was given a dose of Zofran in the triage.  Nausea is much improved.  He denies fever, abdominal pain, chest pain, shortness of breath, or other complaint.  Reports multiple sick contacts at work with the same symptoms.   The history is provided by the patient.  Emesis   This is a new problem. The current episode started 6 to 12 hours ago. The problem occurs 2 to 4 times per day. The problem has been gradually improving. The emesis has an appearance of stomach contents. There has been no fever. Associated symptoms include diarrhea.  Diarrhea   This is a new problem. The current episode started 6 to 12 hours ago. The problem occurs 2 to 4 times per day. The problem has not changed since onset.There has been no fever. Associated symptoms include vomiting. He has tried nothing for the symptoms. Risk factors include ill contacts.    Past Medical History:  Diagnosis Date  . Hypertension     Patient Active Problem List   Diagnosis Date Noted  . OBESITY 05/13/2009  . HYPERTENSION 05/13/2009  . STROKE 05/13/2009  . BACK PAIN 05/13/2009  . SHORTNESS OF BREATH 05/13/2009  . CHEST PAIN 05/13/2009    Past Surgical History:  Procedure Laterality Date  . EYE SURGERY         Home Medications    Prior to Admission medications   Medication Sig Start Date End Date Taking? Authorizing Provider  albuterol (PROVENTIL HFA;VENTOLIN HFA) 108 (90 BASE) MCG/ACT inhaler Inhale 1-2 puffs into the lungs every 6 (six) hours as needed for wheezing. 02/27/15   Pollina, Canary Brimhristopher  J, MD  cetirizine (ZYRTEC) 10 MG tablet Take 1 tablet (10 mg total) by mouth daily. Patient not taking: Reported on 03/04/2017 06/14/15   Henrietta HooverBernhardt, Linda C, NP  dicyclomine (BENTYL) 20 MG tablet Take 1 tablet (20 mg total) by mouth 2 (two) times daily. 03/04/17   Long, Arlyss RepressJoshua G, MD  HYDROcodone-homatropine [facility](HYCODAN) 5-1.5 MG/5ML syrup Take 5 mLs by mouth every 6 (six) hours as needed for cough. Patient not taking: Reported on 03/04/2017 02/27/15   Gilda CreasePollina, Christopher J, MD  ibuprofen (ADVIL,MOTRIN) 600 MG tablet Take 1 tablet (600 mg total) by mouth every 8 (eight) hours as needed for pain or fever. Patient not taking: Reported on 06/14/2015 11/09/12   Moreno-Coll, Adlih, MD  lisinopril-hydrochlorothiazide (PRINZIDE,ZESTORETIC) 20-25 MG per tablet Take 1 tablet by mouth daily. Patient not taking: Reported on 03/04/2017 06/14/15   Henrietta HooverBernhardt, Linda C, NP  omeprazole (PRILOSEC) 20 MG capsule Take 1 capsule (20 mg total) by mouth daily. 06/14/15   Henrietta HooverBernhardt, Linda C, NP  ondansetron (ZOFRAN) 4 MG tablet Take 1 tablet (4 mg total) by mouth every 6 (six) hours. Patient not taking: Reported on 03/04/2017 01/23/16   Patel-Mills, Lorelle FormosaHanna, PA-C  ondansetron (ZOFRAN) 4 MG tablet Take 1 tablet (4 mg total) by mouth every 6 (six) hours. 09/20/17   Wynetta FinesMessick, Gae Bihl C, MD  permethrin (ELIMITE) 5 % cream Apply to affected area once Patient not taking: Reported on 03/04/2017  01/03/17   Ward, Chase Picket, PA-C  sucralfate (CARAFATE) 1 GM/10ML suspension Take 10 mLs (1 g total) by mouth 4 (four) times daily -  with meals and at bedtime. 03/04/17   Long, Arlyss Repress, MD  tetrahydrozoline 0.05 % ophthalmic solution Place 2 drops into both eyes daily as needed (allergies).    [provider]    Family History Family History  Problem Relation Age of Onset  . Diabetes Mother   . Hypertension Mother     Social History Social History   Tobacco Use  . Smoking status: Current Every Day Smoker    Packs/day: 1.00    Types:  Cigarettes  . Smokeless tobacco: Never Used  Substance Use Topics  . Alcohol use: No  . Drug use: No     Allergies   Morphine and related   Review of Systems Review of Systems  HENT: Positive for ear pain.   Gastrointestinal: Positive for diarrhea and vomiting.  All other systems reviewed and are negative.    Physical Exam Updated Vital Signs BP (!) 175/106 (BP Location: Right Arm) Comment: Is supposed to take BP medication but PT cannot afford it.  Pulse 75   Temp 97.9 F (36.6 C) (Oral)   Resp 15   Ht 5\' 6"  (1.676 m)   Wt 101.6 kg (224 lb)   SpO2 98%   BMI 36.15 kg/m   Physical Exam  Constitutional: He is oriented to person, place, and time. He appears well-developed and well-nourished. No distress.  HENT:  Head: Normocephalic and atraumatic.  Mouth/Throat: Oropharynx is clear and moist.  Eyes: Conjunctivae and EOM are normal. Pupils are equal, round, and reactive to light.  Neck: Normal range of motion. Neck supple.  Cardiovascular: Normal rate, regular rhythm and normal heart sounds.  Pulmonary/Chest: Effort normal and breath sounds normal. No respiratory distress.  Abdominal: Soft. He exhibits no distension. There is no tenderness.  Musculoskeletal: Normal range of motion. He exhibits no edema or deformity.  Neurological: He is alert and oriented to person, place, and time.  Skin: Skin is warm and dry.  Psychiatric: He has a normal mood and affect.  Nursing note and vitals reviewed.    ED Treatments / Results  Labs (all labs ordered are listed, but only abnormal results are displayed) Labs Reviewed  COMPREHENSIVE METABOLIC PANEL - Abnormal; Notable for the following components:      Result Value   Glucose, Bld 108 (*)    Calcium 11.3 (*)    All other components within normal limits  LIPASE, BLOOD  CBC  URINALYSIS, ROUTINE W REFLEX MICROSCOPIC    EKG  EKG Interpretation None       Radiology No results found.  Procedures Procedures  (including critical care time)  Medications Ordered in ED Medications  ondansetron (ZOFRAN-ODT) disintegrating tablet 4 mg (4 mg Oral Given 09/20/17 0809)     Initial Impression / Assessment and Plan / ED Course  I have reviewed the triage vital signs and the nursing notes.  Pertinent labs & imaging results that were available during my care of the patient were reviewed by me and considered in my medical decision making (see chart for details).     MSE Complete  Presentation today is consistent with likely viral gastroenteritis.  Patient is improved after dose of Zofran.  Screening laboratory evaluations in the ED today do not reveal significant abnormalities.  Patient desires discharge home.  Close follow-up is advised.  Strict return precautions are given and  understood.  Final Clinical Impressions(s) / ED Diagnoses   Final diagnoses:  Nausea and vomiting, intractability of vomiting not specified, unspecified vomiting type    ED Discharge Orders        Ordered    ondansetron (ZOFRAN) 4 MG tablet  Every 6 hours     09/20/17 1013       Wynetta FinesMessick, Cristol Engdahl C, MD 09/20/17 1024

## 2017-09-20 NOTE — ED Notes (Signed)
Bed: WTR8 Expected date:  Expected time:  Means of arrival:  Comments: 

## 2017-09-20 NOTE — ED Triage Notes (Signed)
Patient c/o bilateral earache yesterday. Patient c/o vomiting and diarrhea since last night x 3 episodes. Patient c/o abdominal aching mid and lower abdomen.

## 2020-02-24 ENCOUNTER — Emergency Department (HOSPITAL_COMMUNITY)
Admission: EM | Admit: 2020-02-24 | Discharge: 2020-02-24 | Disposition: A | Discharge status: Home / Self Care | Payer: No Typology Code available for payment source | Attending: Emergency Medicine | Admitting: Emergency Medicine

## 2020-02-24 DIAGNOSIS — R05 Cough: Secondary | ICD-10-CM | POA: Insufficient documentation

## 2020-02-24 DIAGNOSIS — Z5321 Procedure and treatment not carried out due to patient leaving prior to being seen by health care provider: Secondary | ICD-10-CM | POA: Insufficient documentation

## 2020-02-24 DIAGNOSIS — R197 Diarrhea, unspecified: Secondary | ICD-10-CM | POA: Insufficient documentation

## 2020-02-24 NOTE — ED Triage Notes (Signed)
Onset 1 week productive cough- yellow phlegm, runny nose.  Today had diarrhea x 1, watery.

## 2020-02-24 NOTE — ED Notes (Signed)
Pt informed staff that his father took a fall in the home, states that he will be leaving & returning with father. Pt informed by staff that he is leaving AMA, pt voiced understanding & signed AMA form. Armband removed by staff, pt discharged from ED stable & ambulatory.

## 2020-08-01 ENCOUNTER — Encounter (HOSPITAL_COMMUNITY): Payer: Self-pay | Admitting: Emergency Medicine

## 2020-08-01 ENCOUNTER — Other Ambulatory Visit: Payer: Self-pay

## 2020-08-01 ENCOUNTER — Ambulatory Visit (HOSPITAL_COMMUNITY)
Admission: EM | Admit: 2020-08-01 | Discharge: 2020-08-01 | Disposition: A | Discharge status: Home / Self Care | Payer: Self-pay | Attending: Family Medicine | Admitting: Family Medicine

## 2020-08-01 DIAGNOSIS — I10 Essential (primary) hypertension: Secondary | ICD-10-CM

## 2020-08-01 DIAGNOSIS — H00012 Hordeolum externum right lower eyelid: Secondary | ICD-10-CM

## 2020-08-01 MED ORDER — TOBRAMYCIN 0.3 % OP SOLN: 1.0000 [drp] | Freq: Four times a day (QID) | OPHTHALMIC | 0 refills | Status: DC

## 2020-08-01 NOTE — ED Provider Notes (Signed)
[facility]   [id number] 08/01/20 Arrival Time: 1552  ASSESSMENT & PLAN:  1. Hordeolum externum of right lower eyelid   2. Elevated blood pressure reading in office with diagnosis of hypertension     Meds ordered this encounter  Medications  . tobramycin (TOBREX) 0.3 % ophthalmic solution    Sig: Place 1 drop into the right eye every 6 (six) hours.    Dispense:  5 mL    Refill:  0    Ophthalmic drops per orders. Warm compress to eye(s).   Follow-up Information    [facility].   Specialty: Urgent Care Why: If worsening or failing to improve as anticipated. Contact information: [address] [place] [address] [phone]               Discharge Instructions     Your blood pressure was noted to be elevated during your visit today. If you are currently taking medication for high blood pressure, please ensure you are taking this as directed. If you do not have a history of high blood pressure and your blood pressure remains persistently elevated, you may need to begin taking a medication at some point. You may return here within the next few days to recheck if unable to see your primary care provider or if do not have a one.  BP (!) 177/92 (BP Location: Right Arm)   Pulse (!) 59   Temp 97.6 F (36.4 C) (Oral)   Resp 20   SpO2 100%       Reviewed expectations re: course of current medical issues. Questions answered. Outlined signs and symptoms indicating need for more acute intervention. Patient verbalized understanding. After Visit Summary given.   SUBJECTIVE:  Charles Callahan is a 49 y.o. male who presents with complaint of R lower eyelid irritation; past day; no eye pain. Watery drainage making vision a little blurry. Injury: no. Contact lens use: no. Recent illness: no. Self treatment: none.  Increased blood pressure noted today. Reports that he is treated for HTN. He reports taking medications as  instructed. No symptoms.   OBJECTIVE:  Vitals:   08/01/20 1729  BP: (!) 177/92  Pulse: (!) 59  Resp: 20  Temp: 97.6 F (36.4 C)  TempSrc: Oral  SpO2: 100%    General appearance: alert; no distress HEENT: [place]; AT; PERRLA; no restriction of the extraocular movements OD: without reported pain; without conjunctival injection; with mild watery drainage; without corneal opacities; without limbal flush; without periorbital swelling or erythema; lower medial lid with developing stye Neck: supple without LAD Lungs: clear to auscultation bilaterally; unlabored respirations Heart: regular rate and rhythm Skin: warm and dry Psychological: alert and cooperative; normal mood and affect    Visual Acuity  Right Eye Distance: 20/25 Left Eye Distance: 20/30 Bilateral Distance: 20/25  Right Eye Near:   Left Eye Near:    Bilateral Near:     Allergies  Allergen Reactions  . Morphine And Related Nausea And Vomiting    Past Medical History:  Diagnosis Date  . Hypertension    Social History   Socioeconomic History  . Marital status: Single    Spouse name: Not on file  . Number of children: Not on file  . Years of education: Not on file  . Highest education level: Not on file  Occupational History  . Not on file  Tobacco Use  . Smoking status: Current Some Day Smoker    Packs/day: 1.00  Types: Cigarettes  . Smokeless tobacco: Never Used  Vaping Use  . Vaping Use: Former  Substance and Sexual Activity  . Alcohol use: Yes    Comment: rare  . Drug use: Not Currently  . Sexual activity: Yes  Other Topics Concern  . Not on file  Social History Narrative  . Not on file   Social Determinants of Health   Financial Resource Strain:   . Difficulty of Paying Living Expenses: Not on file  Food Insecurity:   . Worried About Programme researcher, broadcasting/film/video in the Last Year: Not on file  . Ran Out of Food in the Last Year: Not on file  Transportation Needs:   . Lack of Transportation  (Medical): Not on file  . Lack of Transportation (Non-Medical): Not on file  Physical Activity:   . Days of Exercise per Week: Not on file  . Minutes of Exercise per Session: Not on file  Stress:   . Feeling of Stress : Not on file  Social Connections:   . Frequency of Communication with Friends and Family: Not on file  . Frequency of Social Gatherings with Friends and Family: Not on file  . Attends Religious Services: Not on file  . Active Member of Clubs or Organizations: Not on file  . Attends Banker Meetings: Not on file  . Marital Status: Not on file  Intimate Partner Violence:   . Fear of Current or Ex-Partner: Not on file  . Emotionally Abused: Not on file  . Physically Abused: Not on file  . Sexually Abused: Not on file   Family History  Problem Relation Age of Onset  . Diabetes Mother   . Hypertension Mother    Past Surgical History:  Procedure Laterality Date  . EYE SURGERY       Mardella Layman, MD 08/01/20 Charles Callahan

## 2020-08-01 NOTE — Discharge Instructions (Signed)
Your blood pressure was noted to be elevated during your visit today. If you are currently taking medication for high blood pressure, please ensure you are taking this as directed. If you do not have a history of high blood pressure and your blood pressure remains persistently elevated, you may need to begin taking a medication at some point. You may return here within the next few days to recheck if unable to see your primary care provider or if do not have a one.  BP (!) 177/92 (BP Location: Right Arm)   Pulse (!) 59   Temp 97.6 F (36.4 C) (Oral)   Resp 20   SpO2 100%

## 2020-08-01 NOTE — ED Triage Notes (Signed)
Right eye red, itching and burning and had a headache yesterday.  No headache today.  Patient will need a note for work.  Reports vision is blurry in right eye

## 2020-10-29 ENCOUNTER — Other Ambulatory Visit: Payer: Self-pay

## 2020-10-29 DIAGNOSIS — Z20822 Contact with and (suspected) exposure to covid-19: Secondary | ICD-10-CM

## 2020-11-01 LAB — NOVEL CORONAVIRUS, NAA

## 2021-02-16 ENCOUNTER — Emergency Department (HOSPITAL_COMMUNITY)
Admission: EM | Admit: 2021-02-16 | Discharge: 2021-02-16 | Discharge status: Against Medical Advice | Payer: Medicaid Other | Attending: Emergency Medicine | Admitting: Emergency Medicine

## 2021-02-16 ENCOUNTER — Encounter (HOSPITAL_COMMUNITY): Payer: Self-pay | Admitting: Emergency Medicine

## 2021-02-16 ENCOUNTER — Emergency Department (HOSPITAL_COMMUNITY): Payer: Medicaid Other

## 2021-02-16 ENCOUNTER — Other Ambulatory Visit: Payer: Self-pay

## 2021-02-16 DIAGNOSIS — R42 Dizziness and giddiness: Secondary | ICD-10-CM | POA: Insufficient documentation

## 2021-02-16 DIAGNOSIS — F17210 Nicotine dependence, cigarettes, uncomplicated: Secondary | ICD-10-CM | POA: Insufficient documentation

## 2021-02-16 DIAGNOSIS — M549 Dorsalgia, unspecified: Secondary | ICD-10-CM | POA: Diagnosis not present

## 2021-02-16 DIAGNOSIS — I110 Hypertensive heart disease with heart failure: Secondary | ICD-10-CM | POA: Diagnosis not present

## 2021-02-16 DIAGNOSIS — I509 Heart failure, unspecified: Secondary | ICD-10-CM | POA: Insufficient documentation

## 2021-02-16 DIAGNOSIS — R000 Tachycardia, unspecified: Secondary | ICD-10-CM | POA: Insufficient documentation

## 2021-02-16 DIAGNOSIS — R1084 Generalized abdominal pain: Secondary | ICD-10-CM | POA: Insufficient documentation

## 2021-02-16 DIAGNOSIS — I161 Hypertensive emergency: Secondary | ICD-10-CM | POA: Diagnosis not present

## 2021-02-16 DIAGNOSIS — Z79899 Other long term (current) drug therapy: Secondary | ICD-10-CM | POA: Insufficient documentation

## 2021-02-16 DIAGNOSIS — R0789 Other chest pain: Secondary | ICD-10-CM | POA: Diagnosis present

## 2021-02-16 HISTORY — DX: Heart failure, unspecified: I50.9

## 2021-02-16 LAB — HEPATIC FUNCTION PANEL
ALT: 21 U/L (ref 0–44)
AST: 22 U/L (ref 15–41)
Albumin: 3.9 g/dL (ref 3.5–5.0)
Alkaline Phosphatase: 82 U/L (ref 38–126)
Bilirubin, Direct: 0.1 mg/dL (ref 0.0–0.2)
Indirect Bilirubin: 0.9 mg/dL (ref 0.3–0.9)
Total Bilirubin: 1 mg/dL (ref 0.3–1.2)
Total Protein: 7.5 g/dL (ref 6.5–8.1)

## 2021-02-16 LAB — TROPONIN I (HIGH SENSITIVITY)
Troponin I (High Sensitivity): 22 ng/L — ABNORMAL HIGH (ref <18)
Troponin I (High Sensitivity): 21 ng/L — ABNORMAL HIGH (ref <18)

## 2021-02-16 LAB — BASIC METABOLIC PANEL
Anion gap: 11 (ref 5–15)
BUN: 13 mg/dL (ref 6–20)
CO2: 23 mmol/L (ref 22–32)
Calcium: 11.5 mg/dL — ABNORMAL HIGH (ref 8.9–10.3)
Chloride: 100 mmol/L (ref 98–111)
Creatinine, Ser: 1.22 mg/dL (ref 0.61–1.24)
GFR, Estimated: >60 mL/min (ref >60)
Glucose, Bld: 96 mg/dL (ref 70–99)
Potassium: 3.9 mmol/L (ref 3.5–5.1)
Sodium: 134 mmol/L — ABNORMAL LOW (ref 135–145)

## 2021-02-16 LAB — CBC
HCT: 48 % (ref 39.0–52.0)
Hemoglobin: 15.9 g/dL (ref 13.0–17.0)
MCH: 30.8 pg (ref 26.0–34.0)
MCHC: 33.1 g/dL (ref 30.0–36.0)
MCV: 92.8 fL (ref 80.0–100.0)
Platelets: 281 10*3/uL (ref 150–400)
RBC: 5.17 MIL/uL (ref 4.22–5.81)
RDW: 13.3 % (ref 11.5–15.5)
WBC: 6.2 10*3/uL (ref 4.0–10.5)
nRBC: 0 % (ref 0.0–0.2)

## 2021-02-16 LAB — LIPASE, BLOOD: Lipase: 29 U/L (ref 11–51)

## 2021-02-16 MED ORDER — LISINOPRIL 20 MG PO TABS: 20.0000 mg | ORAL_TABLET | Freq: Every day | ORAL | 1 refills | Status: DC

## 2021-02-16 MED ORDER — IOHEXOL 350 MG/ML SOLN
100.0000 mL | Freq: Once | INTRAVENOUS | Status: AC | PRN
  Administered 2021-02-16: 100 mL via INTRAVENOUS

## 2021-02-16 MED ORDER — SODIUM CHLORIDE 0.9 % IV BOLUS: 1000.0000 mL | Freq: Once | INTRAVENOUS | Status: DC

## 2021-02-16 MED ORDER — SODIUM CHLORIDE 0.9 % IV BOLUS
500.0000 mL | Freq: Once | INTRAVENOUS | Status: AC
Start: 2021-02-16 — End: 2021-02-16
  Administered 2021-02-16: 500 mL via INTRAVENOUS

## 2021-02-16 MED ORDER — LISINOPRIL 20 MG PO TABS
20.0000 mg | ORAL_TABLET | Freq: Once | ORAL | Status: AC
  Administered 2021-02-16: 20 mg via ORAL
  Filled 2021-02-16: qty 1

## 2021-02-16 NOTE — ED Provider Notes (Signed)
[facility] EMERGENCY DEPARTMENT Provider Note   CSN: [id number] Arrival date & time: 02/16/21  1418     History Chief Complaint  Patient presents with  . Chest Pain    Charles Callahan is a 50 y.o. male.  HPI 50 year old male presents with severe chest pain.  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  He states he used cocaine last night and this morning and around 10:30 in the morning he developed severe sharp chest pain in his left anterior chest.  He is having pain in his diffuse abdomen as well as back pain.  He felt lightheaded and diffusely numb.  He thought he might pass out.  He has had nausea without vomiting.  He has felt intermittently short of breath.  EMS found the patient to have a blood pressure of 220/110.  This and his symptoms improved with aspirin and 2 nitroglycerin.  Pain is now about a 3 out of 10 where before it was a 10.  Past Medical History:  Diagnosis Date  . CHF (congestive heart failure) (HCC)   . Hypertension     Patient Active Problem List   Diagnosis Date Noted  . OBESITY 05/13/2009  . HYPERTENSION 05/13/2009  . STROKE 05/13/2009  . BACK PAIN 05/13/2009  . SHORTNESS OF BREATH 05/13/2009  . CHEST PAIN 05/13/2009    Past Surgical History:  Procedure Laterality Date  . EYE SURGERY         Family History  Problem Relation Age of Onset  . Diabetes Mother   . Hypertension Mother     Social History   Tobacco Use  . Smoking status: Current Some Day Smoker    Packs/day: 1.00    Types: Cigarettes  . Smokeless tobacco: Never Used  Vaping Use  . Vaping Use: Former  Substance Use Topics  . Alcohol use: Yes    Comment: rare  . Drug use: Yes    Types: Cocaine    Home Medications Prior to Admission medications   Medication Sig Start Date End Date Taking? Authorizing Provider  lisinopril (ZESTRIL) 20 MG tablet Take 1 tablet (20 mg total) by mouth daily. 02/16/21  Yes Pricilla Loveless, MD  albuterol (PROVENTIL HFA;VENTOLIN  HFA) 108 (90 BASE) MCG/ACT inhaler Inhale 1-2 puffs into the lungs every 6 (six) hours as needed for wheezing. 02/27/15   Gilda Crease, MD  cetirizine (ZYRTEC) 10 MG tablet Take 1 tablet (10 mg total) by mouth daily. 06/14/15   Henrietta Hoover, NP  dicyclomine (BENTYL) 20 MG tablet Take 1 tablet (20 mg total) by mouth 2 (two) times daily. 03/04/17   Long, Arlyss Repress, MD  HYDROcodone-homatropine [facility]) 5-1.5 MG/5ML syrup Take 5 mLs by mouth every 6 (six) hours as needed for cough. 02/27/15   Gilda Crease, MD  ibuprofen (ADVIL,MOTRIN) 600 MG tablet Take 1 tablet (600 mg total) by mouth every 8 (eight) hours as needed for pain or fever. 11/09/12   Moreno-Coll, Adlih, MD  omeprazole (PRILOSEC) 20 MG capsule Take 1 capsule (20 mg total) by mouth daily. 06/14/15   Henrietta Hoover, NP  ondansetron (ZOFRAN) 4 MG tablet Take 1 tablet (4 mg total) by mouth every 6 (six) hours. 01/23/16   Patel-Mills, Lorelle Formosa, PA-C  ondansetron (ZOFRAN) 4 MG tablet Take 1 tablet (4 mg total) by mouth every 6 (six) hours. 09/20/17   Wynetta Fines, MD  permethrin (ELIMITE) 5 % cream Apply to affected area once Patient not taking: Reported on 02/16/2021 01/03/17  Ward, Chase PicketJaime Pilcher, PA-C  sucralfate (CARAFATE) 1 GM/10ML suspension Take 10 mLs (1 g total) by mouth 4 (four) times daily -  with meals and at bedtime. 03/04/17   Long, Arlyss RepressJoshua G, MD  tetrahydrozoline 0.05 % ophthalmic solution Place 2 drops into both eyes daily as needed (allergies).    [provider]  tobramycin (TOBREX) 0.3 % ophthalmic solution Place 1 drop into the right eye every 6 (six) hours. 08/01/20   Mardella LaymanHagler, Brian, MD  lisinopril-hydrochlorothiazide (PRINZIDE,ZESTORETIC) 20-25 MG per tablet Take 1 tablet by mouth daily. 06/14/15 02/16/21  Henrietta HooverBernhardt, Linda C, NP    Allergies    Morphine and related  Review of Systems   Review of Systems  Constitutional: Negative for fever.  Respiratory: Positive for shortness of breath.    Cardiovascular: Positive for chest pain.  Gastrointestinal: Positive for abdominal pain and nausea. Negative for vomiting.  Musculoskeletal: Positive for back pain.  Neurological: Positive for light-headedness. Negative for syncope.  All other systems reviewed and are negative.   Physical Exam Updated Vital Signs BP (!) 183/91 (BP Location: Right Arm)   Pulse 68   Temp 98.7 F (37.1 C)   Resp 17   Ht 5' 6.5" (1.689 m)   Wt 102.1 kg   SpO2 100%   BMI 35.77 kg/m   Physical Exam Vitals and nursing note reviewed.  Constitutional:      General: He is not in acute distress.    Appearance: He is well-developed. He is not diaphoretic.  HENT:     Head: Normocephalic and atraumatic.     Right Ear: External ear normal.     Left Ear: External ear normal.     Nose: Nose normal.  Eyes:     General:        Right eye: No discharge.        Left eye: No discharge.  Cardiovascular:     Rate and Rhythm: Regular rhythm. Tachycardia present.     Pulses:          Radial pulses are 2+ on the right side and 2+ on the left side.     Heart sounds: Normal heart sounds.     Comments: Minimally tachycardic, low 100s Pulmonary:     Effort: Pulmonary effort is normal.     Breath sounds: Normal breath sounds.  Chest:     Chest wall: No tenderness.  Abdominal:     Palpations: Abdomen is soft.     Tenderness: There is abdominal tenderness (mild, generalized).  Musculoskeletal:     Cervical back: Neck supple.     Right lower leg: No edema.     Left lower leg: No edema.  Skin:    General: Skin is warm and dry.  Neurological:     Mental Status: He is alert.  Psychiatric:        Mood and Affect: Mood is not anxious.     ED Results / Procedures / Treatments   Labs (all labs ordered are listed, but only abnormal results are displayed) Labs Reviewed  BASIC METABOLIC PANEL - Abnormal; Notable for the following components:      Result Value   Sodium 134 (*)    Calcium 11.5 (*)    All other  components within normal limits  TROPONIN I (HIGH SENSITIVITY) - Abnormal; Notable for the following components:   Troponin I (High Sensitivity) 22 (*)    All other components within normal limits  TROPONIN I (HIGH SENSITIVITY) - Abnormal; Notable for  the following components:   Troponin I (High Sensitivity) 21 (*)    All other components within normal limits  CBC  LIPASE, BLOOD  HEPATIC FUNCTION PANEL    EKG EKG Interpretation  Date/Time:  Sunday Feb 16 2021 14:52:24 EDT Ventricular Rate:  94 PR Interval:  152 QRS Duration: 82 QT Interval:  360 QTC Calculation: 450 R Axis:   67 Text Interpretation: Normal sinus rhythm Right atrial enlargement Borderline ECG Confirmed by Pricilla Loveless [phone]) on 02/16/2021 3:34:04 PM   Radiology DG Chest 2 View  Result Date: 02/16/2021 CLINICAL DATA:  Chest pain for 1 hour EXAM: CHEST - 2 VIEW COMPARISON:  03/04/2017 FINDINGS: The heart size and mediastinal contours are within normal limits. Both lungs are clear. The visualized skeletal structures are unremarkable. IMPRESSION: No active cardiopulmonary disease. Electronically Signed   By: Elige Ko   On: 02/16/2021 15:20   CT Angio Chest/Abd/Pel for Dissection W and/or Wo Contrast  Result Date: 02/16/2021 CLINICAL DATA:  Lambert Mody left-sided chest pain for 1 hour with shortness of breath and nausea. EXAM: CT ANGIOGRAPHY CHEST, ABDOMEN AND PELVIS TECHNIQUE: Non-contrast CT of the chest was initially obtained. Multidetector CT imaging through the chest, abdomen and pelvis was performed using the standard protocol during bolus administration of intravenous contrast. Multiplanar reconstructed images and MIPs were obtained and reviewed to evaluate the vascular anatomy. CONTRAST:  OMNIPAQUE IOHEXOL 350 MG/ML SOLN COMPARISON:  None. FINDINGS: CTA CHEST FINDINGS Cardiovascular: There is no evidence for a thoracic aortic aneurysm or dissection. There are minimal atherosclerotic changes of the thoracic  aorta. There is no large centrally located pulmonary embolism. No significant pericardial effusion. There is mild cardiomegaly. Mediastinum/Nodes: -- No mediastinal lymphadenopathy. -- No hilar lymphadenopathy. -- No axillary lymphadenopathy. -- No supraclavicular lymphadenopathy. --there is a large left-sided thyroid nodule measuring approximately 4.4 x 3.4 cm arising from the inferior left thyroid gland. -  Unremarkable esophagus. Lungs/Pleura: Airways are patent. No pleural effusion, lobar consolidation, pneumothorax or pulmonary infarction. Musculoskeletal: No chest wall abnormality. No bony spinal canal stenosis. Review of the MIP images confirms the above findings. CTA ABDOMEN AND PELVIS FINDINGS VASCULAR Aorta: Normal caliber aorta without aneurysm, dissection, vasculitis or significant stenosis. Celiac: Patent without evidence of aneurysm, dissection, vasculitis or significant stenosis. SMA: Patent without evidence of aneurysm, dissection, vasculitis or significant stenosis. Renals: Both renal arteries are patent without evidence of aneurysm, dissection, vasculitis, fibromuscular dysplasia or significant stenosis. There are 2 right renal arteries. IMA: Patent without evidence of aneurysm, dissection, vasculitis or significant stenosis. Inflow: Patent without evidence of aneurysm, dissection, vasculitis or significant stenosis. Veins: No obvious venous abnormality within the limitations of this arterial phase study. Review of the MIP images confirms the above findings. NON-VASCULAR Hepatobiliary: The liver is normal. Normal gallbladder.There is no biliary ductal dilation. Pancreas: Normal contours without ductal dilatation. No peripancreatic fluid collection. Spleen: Unremarkable. Adrenals/Urinary Tract: --Adrenal glands: Unremarkable. --Right kidney/ureter: No hydronephrosis or radiopaque kidney stones. --Left kidney/ureter: No hydronephrosis or radiopaque kidney stones. --Urinary bladder: Unremarkable.  Stomach/Bowel: --Stomach/Duodenum: No hiatal hernia or other gastric abnormality. Normal duodenal course and caliber. --Small bowel: Unremarkable. --Colon: Unremarkable. --Appendix: Normal. Vascular/Lymphatic: Normal course and caliber of the major abdominal vessels. --No retroperitoneal lymphadenopathy. --No mesenteric lymphadenopathy. --No pelvic or inguinal lymphadenopathy. Reproductive: Unremarkable Other: No ascites or free air. The abdominal wall is normal. Musculoskeletal. No acute displaced fractures. Review of the MIP images confirms the above findings. IMPRESSION: 1. No evidence for an aortic dissection.  No acute abnormality. 2. Large left-sided  thyroid nodule. Outpatient thyroid ultrasound follow-up is recommended.(Ref: J Am Coll Radiol. 2015 Feb;12(2): 143-50). 3.  Aortic Atherosclerosis (ICD10-I70.0). Electronically Signed   By: Katherine Mantle M.D.   On: 02/16/2021 18:14    Procedures Procedures   Medications Ordered in ED Medications  sodium chloride 0.9 % bolus 500 mL (0 mLs Intravenous Stopped 02/16/21 1650)  iohexol (OMNIPAQUE) 350 MG/ML injection 100 mL (100 mLs Intravenous Contrast Given 02/16/21 1800)  lisinopril (ZESTRIL) tablet 20 mg (20 mg Oral Given 02/16/21 1929)    ED Course  I have reviewed the triage vital signs and the nursing notes.  Pertinent labs & imaging results that were available during my care of the patient were reviewed by me and considered in my medical decision making (see chart for details).    MDM Rules/Calculators/A&P                          Patient's pain seems to have resolved.  However he remains quite hypertensive.  Given his multiple areas of pain, dissection study obtained and is negative.  Troponins are mildly elevated but flat.  I discussed with cardiology who agrees he probably needs admission for blood pressure control in the setting of chest pain.  However when I discussed this with patient he does not want to be admitted.  I discussed  risk/benefits of leaving and he understands that leaving without controlling his blood pressure could result in MI, stroke, death, kidney failure, etc.  Will restart on lisinopril.  He used to be on very large doses of lisinopril and HCTZ but he has not been on meds for years so do not think he needs to start that high.  He does have chronic hypercalcemia that is unchanged.  Discussed the importance of following up with a PCP and getting better blood pressure control Final Clinical Impression(s) / ED Diagnoses Final diagnoses:  Hypertensive emergency    Rx / DC Orders ED Discharge Orders         Ordered    lisinopril (ZESTRIL) 20 MG tablet  Daily        02/16/21 1859           Pricilla Loveless, MD 02/16/21 2253

## 2021-02-16 NOTE — Discharge Instructions (Signed)
You are leaving AGAINST MEDICAL ADVICE.  It is very important that you stop using cocaine.  Your blood pressure is out of control today and you need to be reestablished on medicines.  I have prescribed 1 blood pressure medicine to today but she will need close outpatient follow-up to help determine the best blood pressure regimen.  If you develop new or worsening chest pain, headache, shortness of breath, vomiting, or any other new/concerning symptoms then return to the ER for evaluation.

## 2021-02-16 NOTE — ED Triage Notes (Signed)
Pt to triage via [facility] from home.  Reports L sided sharp CP x 1 hour with SOB and nausea.  Used crack yesterday.  EMS administered ASA 324mg  and NTG x2.  18g LAC.  BP 220/110 and 160/90 after NTG.  Pain decreased from 7 to 3 and now back to 6/10.

## 2021-02-27 ENCOUNTER — Inpatient Hospital Stay (HOSPITAL_COMMUNITY)
Admission: EM | Admit: 2021-02-27 | Discharge: 2021-03-05 | DRG: 065 | Disposition: A | Discharge status: Rehabilitation Facility | Payer: Medicaid Other | Attending: Internal Medicine | Admitting: Internal Medicine

## 2021-02-27 ENCOUNTER — Other Ambulatory Visit: Payer: Self-pay

## 2021-02-27 ENCOUNTER — Emergency Department (HOSPITAL_COMMUNITY): Payer: Medicaid Other

## 2021-02-27 ENCOUNTER — Encounter (HOSPITAL_COMMUNITY): Payer: Self-pay

## 2021-02-27 DIAGNOSIS — I10 Essential (primary) hypertension: Secondary | ICD-10-CM | POA: Diagnosis present

## 2021-02-27 DIAGNOSIS — R9431 Abnormal electrocardiogram [ECG] [EKG]: Secondary | ICD-10-CM

## 2021-02-27 DIAGNOSIS — F17210 Nicotine dependence, cigarettes, uncomplicated: Secondary | ICD-10-CM | POA: Diagnosis present

## 2021-02-27 DIAGNOSIS — Z20822 Contact with and (suspected) exposure to covid-19: Secondary | ICD-10-CM | POA: Diagnosis present

## 2021-02-27 DIAGNOSIS — N390 Urinary tract infection, site not specified: Secondary | ICD-10-CM | POA: Diagnosis present

## 2021-02-27 DIAGNOSIS — I110 Hypertensive heart disease with heart failure: Secondary | ICD-10-CM | POA: Diagnosis present

## 2021-02-27 DIAGNOSIS — Z79899 Other long term (current) drug therapy: Secondary | ICD-10-CM

## 2021-02-27 DIAGNOSIS — E041 Nontoxic single thyroid nodule: Secondary | ICD-10-CM | POA: Diagnosis present

## 2021-02-27 DIAGNOSIS — Z8249 Family history of ischemic heart disease and other diseases of the circulatory system: Secondary | ICD-10-CM

## 2021-02-27 DIAGNOSIS — Z823 Family history of stroke: Secondary | ICD-10-CM

## 2021-02-27 DIAGNOSIS — R0683 Snoring: Secondary | ICD-10-CM | POA: Diagnosis present

## 2021-02-27 DIAGNOSIS — I161 Hypertensive emergency: Secondary | ICD-10-CM | POA: Diagnosis present

## 2021-02-27 DIAGNOSIS — I5032 Chronic diastolic (congestive) heart failure: Secondary | ICD-10-CM | POA: Diagnosis present

## 2021-02-27 DIAGNOSIS — I69354 Hemiplegia and hemiparesis following cerebral infarction affecting left non-dominant side: Secondary | ICD-10-CM

## 2021-02-27 DIAGNOSIS — K219 Gastro-esophageal reflux disease without esophagitis: Secondary | ICD-10-CM | POA: Diagnosis present

## 2021-02-27 DIAGNOSIS — G8191 Hemiplegia, unspecified affecting right dominant side: Secondary | ICD-10-CM

## 2021-02-27 DIAGNOSIS — Z560 Unemployment, unspecified: Secondary | ICD-10-CM

## 2021-02-27 DIAGNOSIS — Z885 Allergy status to narcotic agent status: Secondary | ICD-10-CM

## 2021-02-27 DIAGNOSIS — R0681 Apnea, not elsewhere classified: Secondary | ICD-10-CM | POA: Diagnosis present

## 2021-02-27 DIAGNOSIS — I639 Cerebral infarction, unspecified: Principal | ICD-10-CM | POA: Diagnosis present

## 2021-02-27 DIAGNOSIS — E079 Disorder of thyroid, unspecified: Secondary | ICD-10-CM

## 2021-02-27 DIAGNOSIS — R29708 NIHSS score 8: Secondary | ICD-10-CM | POA: Diagnosis present

## 2021-02-27 DIAGNOSIS — E785 Hyperlipidemia, unspecified: Secondary | ICD-10-CM

## 2021-02-27 DIAGNOSIS — F1990 Other psychoactive substance use, unspecified, uncomplicated: Secondary | ICD-10-CM

## 2021-02-27 LAB — PROTIME-INR
INR: 0.9 (ref 0.8–1.2)
Prothrombin Time: 12.1 seconds (ref 11.4–15.2)

## 2021-02-27 LAB — COMPREHENSIVE METABOLIC PANEL
ALT: 25 U/L (ref 0–44)
AST: 19 U/L (ref 15–41)
Albumin: 3.6 g/dL (ref 3.5–5.0)
Alkaline Phosphatase: 89 U/L (ref 38–126)
Anion gap: 5 (ref 5–15)
BUN: 15 mg/dL (ref 6–20)
CO2: 26 mmol/L (ref 22–32)
Calcium: 11.5 mg/dL — ABNORMAL HIGH (ref 8.9–10.3)
Chloride: 102 mmol/L (ref 98–111)
Creatinine, Ser: 1.27 mg/dL — ABNORMAL HIGH (ref 0.61–1.24)
GFR, Estimated: >60 mL/min (ref >60)
Glucose, Bld: 99 mg/dL (ref 70–99)
Potassium: 3.8 mmol/L (ref 3.5–5.1)
Sodium: 133 mmol/L — ABNORMAL LOW (ref 135–145)
Total Bilirubin: 0.3 mg/dL (ref 0.3–1.2)
Total Protein: 6.5 g/dL (ref 6.5–8.1)

## 2021-02-27 LAB — CBC
HCT: 47.2 % (ref 39.0–52.0)
Hemoglobin: 15.4 g/dL (ref 13.0–17.0)
MCH: 30.3 pg (ref 26.0–34.0)
MCHC: 32.6 g/dL (ref 30.0–36.0)
MCV: 92.9 fL (ref 80.0–100.0)
Platelets: 281 10*3/uL (ref 150–400)
RBC: 5.08 MIL/uL (ref 4.22–5.81)
RDW: 13.6 % (ref 11.5–15.5)
WBC: 5.6 10*3/uL (ref 4.0–10.5)
nRBC: 0 % (ref 0.0–0.2)

## 2021-02-27 LAB — RAPID URINE DRUG SCREEN, HOSP PERFORMED
Amphetamines: NOT DETECTED
Barbiturates: NOT DETECTED
Benzodiazepines: NOT DETECTED
Cocaine: NOT DETECTED
Opiates: NOT DETECTED
Tetrahydrocannabinol: NOT DETECTED

## 2021-02-27 LAB — URINALYSIS, ROUTINE W REFLEX MICROSCOPIC
Bacteria, UA: NONE SEEN
Bilirubin Urine: NEGATIVE
Glucose, UA: NEGATIVE mg/dL
Ketones, ur: NEGATIVE mg/dL
Nitrite: NEGATIVE
Protein, ur: NEGATIVE mg/dL
Specific Gravity, Urine: 1.019 (ref 1.005–1.030)
WBC, UA: >50 WBC/hpf — ABNORMAL HIGH (ref 0–5)
pH: 5 (ref 5.0–8.0)

## 2021-02-27 LAB — DIFFERENTIAL
Abs Immature Granulocytes: 0.01 10*3/uL (ref 0.00–0.07)
Basophils Absolute: 0 10*3/uL (ref 0.0–0.1)
Basophils Relative: 0 %
Eosinophils Absolute: 0.1 10*3/uL (ref 0.0–0.5)
Eosinophils Relative: 2 %
Immature Granulocytes: 0 %
Lymphocytes Relative: 34 %
Lymphs Abs: 1.9 10*3/uL (ref 0.7–4.0)
Monocytes Absolute: 0.7 10*3/uL (ref 0.1–1.0)
Monocytes Relative: 13 %
Neutro Abs: 2.8 10*3/uL (ref 1.7–7.7)
Neutrophils Relative %: 51 %

## 2021-02-27 LAB — APTT: aPTT: 33 seconds (ref 24–36)

## 2021-02-27 LAB — RESP PANEL BY RT-PCR (FLU A&B, COVID) ARPGX2
Influenza A by PCR: NEGATIVE
Influenza B by PCR: NEGATIVE
SARS Coronavirus 2 by RT PCR: NEGATIVE

## 2021-02-27 LAB — ETHANOL: Alcohol, Ethyl (B): <10 mg/dL (ref <10)

## 2021-02-27 NOTE — ED Triage Notes (Signed)
Pt bib [facility]. Pt c/o right arm numbness and weakness that started yesterday morning. Last time moved normally was two days ago. Pt also having weakness in right leg. Pt c/o headache and blurred vision. Pt's BP w/EMS 250/130, pt has been out of blood pressure medications, "haven't had them in a while".

## 2021-02-27 NOTE — ED Provider Notes (Signed)
Emergency Medicine Provider Triage Evaluation Note  DATRON BRAKEBILL , a 51 y.o. male  was evaluated in triage.  Pt complains of right arm and leg weakness. He further reports right arm numbness.  He woke up at 730 am yesterday with symptoms and his LNW was the night before.   Review of Systems  Positive: Weakness, numbness Negative: Chest pain  Physical Exam  There were no vitals taken for this visit. Gen:   Awake, no distress   Resp:  Normal effort  Other:  Right facial droop, rue/rle weakness and decreased sensation compared to the left.   Medical Decision Making  Medically screening exam initiated at 6:05 PM.  Appropriate orders placed.  JEFFRY VOGELSANG was informed that the remainder of the evaluation will be completed by another provider, this initial triage assessment does not replace that evaluation, and the importance of remaining in the ED until their evaluation is complete.     Rayne Du 02/27/21 1810    Derwood Kaplan, MD 02/28/21 1540

## 2021-02-28 ENCOUNTER — Emergency Department (HOSPITAL_COMMUNITY): Payer: Medicaid Other

## 2021-02-28 ENCOUNTER — Encounter (HOSPITAL_COMMUNITY): Payer: Self-pay | Admitting: Emergency Medicine

## 2021-02-28 ENCOUNTER — Inpatient Hospital Stay (HOSPITAL_COMMUNITY): Payer: Medicaid Other

## 2021-02-28 DIAGNOSIS — R8281 Pyuria: Secondary | ICD-10-CM | POA: Diagnosis not present

## 2021-02-28 DIAGNOSIS — R0681 Apnea, not elsewhere classified: Secondary | ICD-10-CM | POA: Diagnosis present

## 2021-02-28 DIAGNOSIS — Z885 Allergy status to narcotic agent status: Secondary | ICD-10-CM | POA: Diagnosis not present

## 2021-02-28 DIAGNOSIS — I639 Cerebral infarction, unspecified: Secondary | ICD-10-CM | POA: Diagnosis present

## 2021-02-28 DIAGNOSIS — Z560 Unemployment, unspecified: Secondary | ICD-10-CM | POA: Diagnosis not present

## 2021-02-28 DIAGNOSIS — R110 Nausea: Secondary | ICD-10-CM | POA: Diagnosis not present

## 2021-02-28 DIAGNOSIS — Z8249 Family history of ischemic heart disease and other diseases of the circulatory system: Secondary | ICD-10-CM | POA: Diagnosis not present

## 2021-02-28 DIAGNOSIS — I69354 Hemiplegia and hemiparesis following cerebral infarction affecting left non-dominant side: Secondary | ICD-10-CM | POA: Diagnosis not present

## 2021-02-28 DIAGNOSIS — E785 Hyperlipidemia, unspecified: Secondary | ICD-10-CM | POA: Diagnosis present

## 2021-02-28 DIAGNOSIS — I10 Essential (primary) hypertension: Secondary | ICD-10-CM | POA: Diagnosis not present

## 2021-02-28 DIAGNOSIS — N390 Urinary tract infection, site not specified: Secondary | ICD-10-CM | POA: Diagnosis present

## 2021-02-28 DIAGNOSIS — E46 Unspecified protein-calorie malnutrition: Secondary | ICD-10-CM | POA: Diagnosis not present

## 2021-02-28 DIAGNOSIS — I161 Hypertensive emergency: Secondary | ICD-10-CM | POA: Diagnosis present

## 2021-02-28 DIAGNOSIS — K219 Gastro-esophageal reflux disease without esophagitis: Secondary | ICD-10-CM | POA: Diagnosis present

## 2021-02-28 DIAGNOSIS — E871 Hypo-osmolality and hyponatremia: Secondary | ICD-10-CM | POA: Diagnosis not present

## 2021-02-28 DIAGNOSIS — I110 Hypertensive heart disease with heart failure: Secondary | ICD-10-CM | POA: Diagnosis present

## 2021-02-28 DIAGNOSIS — I5032 Chronic diastolic (congestive) heart failure: Secondary | ICD-10-CM | POA: Diagnosis present

## 2021-02-28 DIAGNOSIS — G8191 Hemiplegia, unspecified affecting right dominant side: Secondary | ICD-10-CM | POA: Diagnosis not present

## 2021-02-28 DIAGNOSIS — F17210 Nicotine dependence, cigarettes, uncomplicated: Secondary | ICD-10-CM | POA: Diagnosis present

## 2021-02-28 DIAGNOSIS — E8809 Other disorders of plasma-protein metabolism, not elsewhere classified: Secondary | ICD-10-CM | POA: Diagnosis not present

## 2021-02-28 DIAGNOSIS — R0683 Snoring: Secondary | ICD-10-CM | POA: Diagnosis present

## 2021-02-28 DIAGNOSIS — Z79899 Other long term (current) drug therapy: Secondary | ICD-10-CM | POA: Diagnosis not present

## 2021-02-28 DIAGNOSIS — Z20822 Contact with and (suspected) exposure to covid-19: Secondary | ICD-10-CM | POA: Diagnosis present

## 2021-02-28 DIAGNOSIS — I69351 Hemiplegia and hemiparesis following cerebral infarction affecting right dominant side: Secondary | ICD-10-CM | POA: Diagnosis not present

## 2021-02-28 DIAGNOSIS — E041 Nontoxic single thyroid nodule: Secondary | ICD-10-CM | POA: Diagnosis present

## 2021-02-28 DIAGNOSIS — R9431 Abnormal electrocardiogram [ECG] [EKG]: Secondary | ICD-10-CM | POA: Diagnosis present

## 2021-02-28 DIAGNOSIS — Z823 Family history of stroke: Secondary | ICD-10-CM | POA: Diagnosis not present

## 2021-02-28 DIAGNOSIS — Z720 Tobacco use: Secondary | ICD-10-CM

## 2021-02-28 DIAGNOSIS — I6389 Other cerebral infarction: Secondary | ICD-10-CM | POA: Diagnosis not present

## 2021-02-28 DIAGNOSIS — F1990 Other psychoactive substance use, unspecified, uncomplicated: Secondary | ICD-10-CM

## 2021-02-28 DIAGNOSIS — R29708 NIHSS score 8: Secondary | ICD-10-CM | POA: Diagnosis present

## 2021-02-28 LAB — TROPONIN I (HIGH SENSITIVITY)
Troponin I (High Sensitivity): 15 ng/L (ref <18)
Troponin I (High Sensitivity): 19 ng/L — ABNORMAL HIGH (ref <18)
Troponin I (High Sensitivity): 19 ng/L — ABNORMAL HIGH (ref <18)

## 2021-02-28 LAB — LACTIC ACID, PLASMA: Lactic Acid, Venous: 0.9 mmol/L (ref 0.5–1.9)

## 2021-02-28 LAB — CK: Total CK: 77 U/L (ref 49–397)

## 2021-02-28 LAB — HEMOGLOBIN A1C
Hgb A1c MFr Bld: 5.9 % — ABNORMAL HIGH (ref 4.8–5.6)
Mean Plasma Glucose: 122.63 mg/dL

## 2021-02-28 LAB — RPR: RPR Ser Ql: NONREACTIVE

## 2021-02-28 LAB — TSH: TSH: 1.125 u[IU]/mL (ref 0.350–4.500)

## 2021-02-28 LAB — HIV ANTIBODY (ROUTINE TESTING W REFLEX): HIV Screen 4th Generation wRfx: NONREACTIVE

## 2021-02-28 MED ORDER — ENOXAPARIN SODIUM 40 MG/0.4ML IJ SOSY
40.0000 mg | PREFILLED_SYRINGE | INTRAMUSCULAR | Status: DC
  Administered 2021-02-28 – 2021-03-04 (×5): 40 mg via SUBCUTANEOUS
  Filled 2021-02-28 (×5): qty 0.4

## 2021-02-28 MED ORDER — LABETALOL HCL 5 MG/ML IV SOLN
10.0000 mg | INTRAVENOUS | Status: DC | PRN
  Administered 2021-02-28: 10 mg via INTRAVENOUS
  Filled 2021-02-28: qty 4

## 2021-02-28 MED ORDER — ATORVASTATIN CALCIUM 80 MG PO TABS
80.0000 mg | ORAL_TABLET | Freq: Every day | ORAL | Status: DC
  Administered 2021-02-28 – 2021-03-05 (×6): 80 mg via ORAL
  Filled 2021-02-28 (×2): qty 1
  Filled 2021-02-28: qty 2
  Filled 2021-02-28 (×2): qty 1
  Filled 2021-02-28: qty 2

## 2021-02-28 MED ORDER — SODIUM CHLORIDE 0.9 % IV SOLN: INTRAVENOUS | Status: DC

## 2021-02-28 MED ORDER — HYDRALAZINE HCL 10 MG PO TABS: 10.0000 mg | ORAL_TABLET | Freq: Four times a day (QID) | ORAL | Status: DC | PRN

## 2021-02-28 MED ORDER — SODIUM CHLORIDE 0.9 % IV SOLN
1.0000 g | Freq: Once | INTRAVENOUS | Status: AC
  Administered 2021-02-28: 1 g via INTRAVENOUS
  Filled 2021-02-28: qty 10

## 2021-02-28 MED ORDER — ASPIRIN 81 MG PO CHEW
81.0000 mg | CHEWABLE_TABLET | Freq: Every day | ORAL | Status: DC
  Administered 2021-02-28 – 2021-03-01 (×2): 81 mg via ORAL
  Filled 2021-02-28 (×2): qty 1

## 2021-02-28 MED ORDER — STROKE: EARLY STAGES OF RECOVERY BOOK
Freq: Once | Status: DC
  Filled 2021-02-28: qty 1

## 2021-02-28 MED ORDER — ACETAMINOPHEN 160 MG/5ML PO SOLN: 650.0000 mg | ORAL | Status: DC | PRN

## 2021-02-28 MED ORDER — NICOTINE 14 MG/24HR TD PT24
14.0000 mg | MEDICATED_PATCH | Freq: Every day | TRANSDERMAL | Status: DC
  Administered 2021-02-28 – 2021-03-04 (×6): 14 mg via TRANSDERMAL
  Filled 2021-02-28 (×6): qty 1

## 2021-02-28 MED ORDER — ACETAMINOPHEN 325 MG PO TABS
650.0000 mg | ORAL_TABLET | ORAL | Status: DC | PRN
  Administered 2021-03-05: 650 mg via ORAL
  Filled 2021-02-28: qty 2

## 2021-02-28 MED ORDER — ACETAMINOPHEN 650 MG RE SUPP: 650.0000 mg | RECTAL | Status: DC | PRN

## 2021-02-28 MED ORDER — SENNOSIDES-DOCUSATE SODIUM 8.6-50 MG PO TABS: 1.0000 | ORAL_TABLET | Freq: Every evening | ORAL | Status: DC | PRN

## 2021-02-28 MED ORDER — CLOPIDOGREL BISULFATE 75 MG PO TABS
75.0000 mg | ORAL_TABLET | Freq: Every day | ORAL | Status: DC
  Administered 2021-02-28 – 2021-03-05 (×6): 75 mg via ORAL
  Filled 2021-02-28 (×6): qty 1

## 2021-02-28 MED ORDER — IOHEXOL 350 MG/ML SOLN
50.0000 mL | Freq: Once | INTRAVENOUS | Status: AC | PRN
  Administered 2021-02-28: 50 mL via INTRAVENOUS

## 2021-02-28 MED ORDER — LACTATED RINGERS IV BOLUS
1000.0000 mL | Freq: Once | INTRAVENOUS | Status: AC
  Administered 2021-02-28: 1000 mL via INTRAVENOUS

## 2021-02-28 MED ORDER — CLOPIDOGREL BISULFATE 75 MG PO TABS
225.0000 mg | ORAL_TABLET | Freq: Once | ORAL | Status: AC
  Administered 2021-02-28: 225 mg via ORAL
  Filled 2021-02-28: qty 3

## 2021-02-28 MED ORDER — PANTOPRAZOLE SODIUM 40 MG PO TBEC
40.0000 mg | DELAYED_RELEASE_TABLET | Freq: Every day | ORAL | Status: DC
  Administered 2021-02-28 – 2021-03-05 (×6): 40 mg via ORAL
  Filled 2021-02-28 (×6): qty 1

## 2021-02-28 NOTE — Progress Notes (Signed)
  Charles Callahan is a 50 y.o. male patient admitted from ED awake, arrived to unit via stretcher at 1655. Patient is  alert  & orientated  X 4,  Full Code, VSS - Blood pressure (!) 177/97, pulse (!) 57, temperature 98.4 F (36.9 C), temperature source Oral, resp. rate 20, SpO2 100 %., no c/o shortness of breath, no c/o chest pain, no distress noted.   IV site WDL: with a transparent dsg that's clean dry and intact.  Pt orientation to unit, room and routine. Information packet given to patient.  Admission INP armband ID verified with patient and in place. SR up x 2, fall risk assessment complete with Patient and he verbalized understanding of risks associated with falls. Pt verbalizes an understanding of how to use the call bell and to call for help before getting out of bed.  Skin, clean-dry- intact without evidence of bruising, or skin tears.   No evidence of skin break down noted on exam.   Will cont to monitor and assist as needed.  Melvenia Needles, RN 02/28/2021 6:45 PM

## 2021-02-28 NOTE — ED Notes (Signed)
Pt returned from CT °

## 2021-02-28 NOTE — Evaluation (Addendum)
Physical Therapy Evaluation Patient Details Name: Charles Callahan MRN: [id number] DOB: Feb 20, 1971 Today's Date: 02/28/2021   History of Present Illness  Pt is a 50 y/o male admitted 5/12 secondary to worsening  R UE and RLE weakness, numbness. Imaging revealed multiple L hemisphere infarcts. PMH includes HTN, tobacco use, and substance abuse.  Clinical Impression  Pt admitted secondary to problem above with deficits below. Demonstrating LUE and LLE weakness and decreased sensation. Requiring min A for bed mobility and transfers and min to mod A to take side steps at EOB. Required assist to move RLE when taking steps. Feel at this point, pt may benefit from CIR level therapies, however, unsure if he will qualify. Will continue to follow acutely to progress mobility and update recommendations based on pt progression.     Follow Up Recommendations Supervision for mobility/OOB;CIR    Equipment Recommendations  Other (comment) (TBD pending progression)    Recommendations for Other Services       Precautions / Restrictions Precautions Precautions: Fall Restrictions Weight Bearing Restrictions: No      Mobility  Bed Mobility Overal bed mobility: Needs Assistance Bed Mobility: Supine to Sit;Sit to Supine     Supine to sit: Min assist Sit to supine: Min assist   General bed mobility comments: Min A for trunk and RLE assist to come to sitting.    Transfers Overall transfer level: Needs assistance Equipment used: 1 person hand held assist Transfers: Sit to/from Stand Sit to Stand: Min assist         General transfer comment: Min A for lift assist and steadying.  Ambulation/Gait Ambulation/Gait assistance: Min assist;Mod assist   Assistive device: 1 person hand held assist       General Gait Details: Took side steps at EOB. Required physical assist to move RLE to take steps. Instability noted in RLE during stance phase and required buckling.  Stairs             Wheelchair Mobility    Modified Rankin (Stroke Patients Only) Modified Rankin (Stroke Patients Only) Pre-Morbid Rankin Score: No symptoms Modified Rankin: Moderately severe disability     Balance Overall balance assessment: Needs assistance Sitting-balance support: No upper extremity supported;Feet supported Sitting balance-Leahy Scale: Fair     Standing balance support: Single extremity supported;During functional activity Standing balance-Leahy Scale: Poor Standing balance comment: Reliant on UE and external support                             Pertinent Vitals/Pain Pain Assessment: No/denies pain    Home Living Family/patient expects to be discharged to:: Private residence Living Arrangements: Parent (mom) Available Help at Discharge: Family;Available PRN/intermittently Type of Home: House Home Access: Level entry     Home Layout: One level Home Equipment: None      Prior Function Level of Independence: Independent               Hand Dominance        Extremity/Trunk Assessment   Upper Extremity Assessment Upper Extremity Assessment: Defer to OT evaluation (RUE weakness noted throughout and numbness reported.)    Lower Extremity Assessment Lower Extremity Assessment: RLE deficits/detail RLE Deficits / Details: Grossly 1/5 in ankle DF, and 2/5 throughout rest of LE. Was able to feel touch, however, reports it is less than normal.    Cervical / Trunk Assessment Cervical / Trunk Assessment: Normal  Communication   Communication: No difficulties  Cognition Arousal/Alertness:  Awake/alert Behavior During Therapy: WFL for tasks assessed/performed Overall Cognitive Status: Within Functional Limits for tasks assessed                                 General Comments: Pt tearful during session about current deficits.      General Comments      Exercises     Assessment/Plan    PT Assessment Patient needs continued PT  services  PT Problem List Decreased strength;Decreased range of motion;Decreased activity tolerance;Decreased balance;Decreased mobility;Decreased knowledge of use of DME;Decreased knowledge of precautions       PT Treatment Interventions DME instruction;Gait training;Functional mobility training;Therapeutic exercise;Therapeutic activities;Balance training;Patient/family education    PT Goals (Current goals can be found in the Care Plan section)  Acute Rehab PT Goals Patient Stated Goal: to be independent PT Goal Formulation: With patient Time For Goal Achievement: 03/14/21 Potential to Achieve Goals: Good    Frequency Min 4X/week   Barriers to discharge        Co-evaluation               AM-PAC PT "6 Clicks" Mobility  Outcome Measure Help needed turning from your back to your side while in a flat bed without using bedrails?: A Little Help needed moving from lying on your back to sitting on the side of a flat bed without using bedrails?: A Little Help needed moving to and from a bed to a chair (including a wheelchair)?: A Lot Help needed standing up from a chair using your arms (e.g., wheelchair or bedside chair)?: A Little Help needed to walk in hospital room?: A Lot Help needed climbing 3-5 steps with a railing? : Total 6 Click Score: 14    End of Session Equipment Utilized During Treatment: Gait belt Activity Tolerance: Patient tolerated treatment well Patient left: in bed;with call bell/phone within reach (on stretcher in ED) Nurse Communication: Mobility status PT Visit Diagnosis: Hemiplegia and hemiparesis;Unsteadiness on feet (R26.81);Difficulty in walking, not elsewhere classified (R26.2) Hemiplegia - Right/Left: Right Hemiplegia - dominant/non-dominant: Dominant Hemiplegia - caused by: Cerebral infarction    Time: 4098-1191 PT Time Calculation (min) (ACUTE ONLY): 17 min   Charges:   PT Evaluation $PT Eval Moderate Complexity: 1 Mod           Farley Ly, PT, DPT  Acute Rehabilitation Services  Pager: [phone] Office: [phone]   Lehman Prom 02/28/2021, 1:52 PM

## 2021-02-28 NOTE — ED Notes (Signed)
Pt given refreshments and sandwich bag.

## 2021-02-28 NOTE — ED Notes (Addendum)
Admit team notified of pt elevated bp. Secured chat sent to provider. Pt is alert and oriented x 4.

## 2021-02-28 NOTE — ED Notes (Signed)
Pt in CT.

## 2021-02-28 NOTE — ED Notes (Signed)
Pt

## 2021-02-28 NOTE — Plan of Care (Signed)
°  Problem: Coping: °Goal: Will verbalize positive feelings about self °Outcome: Progressing °  °Problem: Ischemic Stroke/TIA Tissue Perfusion: °Goal: Complications of ischemic stroke/TIA will be minimized °Outcome: Progressing °  °

## 2021-02-28 NOTE — ED Provider Notes (Signed)
[facility]   CSN: [id number] Arrival date & time: 02/27/21  1758     History Chief Complaint  Patient presents with  . Weakness    Charles Callahan is a 50 y.o. male.  HPI Patient is a 50 year old male w CHF and HTN, cocaine use  Patient is a reliable story and.  He states that he has had right arm weakness and right leg weakness that began Wednesday morning when he woke up.  He states his symptoms of been persistent since that time has not had any significant improvement in symptoms.  Denies any slurred speech, vision changes, head trauma.  He states that he has been on blood pressure medications in the past but is not on any currently because he cannot afford them.  On my review of EMR it does appear that he was seen 02/16/2021 for hypertensive emergency and mildly elevated troponins that were flat was offered admission but declined.  Was prescribed medications but does not seem to have started these although he tells me "not anymore because he cannot afford" when asked if he was taking his blood pressure medications.  He is reticent about how long it has been that he has been off them but per prior provider's note it has been years.  He denies any chest pain shortness of breath cough, congestion, urinary symptoms.     Past Medical History:  Diagnosis Date  . CHF (congestive heart failure) (HCC)   . Hypertension     Patient Active Problem List   Diagnosis Date Noted  . OBESITY 05/13/2009  . HYPERTENSION 05/13/2009  . STROKE 05/13/2009  . BACK PAIN 05/13/2009  . SHORTNESS OF BREATH 05/13/2009  . CHEST PAIN 05/13/2009    Past Surgical History:  Procedure Laterality Date  . EYE SURGERY         Family History  Problem Relation Age of Onset  . Diabetes Mother   . Hypertension Mother     Social History   Tobacco Use  . Smoking status: Current Some Day Smoker    Packs/day: 1.00    Types: Cigarettes  . Smokeless  tobacco: Never Used  Vaping Use  . Vaping Use: Former  Substance Use Topics  . Alcohol use: Yes    Comment: rare  . Drug use: Yes    Types: Cocaine    Home Medications Prior to Admission medications   Medication Sig Start Date End Date Taking? Authorizing Provider  albuterol (PROVENTIL HFA;VENTOLIN HFA) 108 (90 BASE) MCG/ACT inhaler Inhale 1-2 puffs into the lungs every 6 (six) hours as needed for wheezing. 02/27/15   Pollina, Canary Brimhristopher J, MD  cetirizine (ZYRTEC) 10 MG tablet Take 1 tablet (10 mg total) by mouth daily. 06/14/15   Henrietta HooverBernhardt, Linda C, NP  dicyclomine (BENTYL) 20 MG tablet Take 1 tablet (20 mg total) by mouth 2 (two) times daily. 03/04/17   Long, Arlyss RepressJoshua G, MD  HYDROcodone-homatropine [facility](HYCODAN) 5-1.5 MG/5ML syrup Take 5 mLs by mouth every 6 (six) hours as needed for cough. 02/27/15   Gilda CreasePollina, Christopher J, MD  ibuprofen (ADVIL,MOTRIN) 600 MG tablet Take 1 tablet (600 mg total) by mouth every 8 (eight) hours as needed for pain or fever. 11/09/12   Moreno-Coll, Adlih, MD  lisinopril (ZESTRIL) 20 MG tablet Take 1 tablet (20 mg total) by mouth daily. 02/16/21   Pricilla LovelessGoldston, Scott, MD  omeprazole (PRILOSEC) 20 MG capsule Take 1 capsule (20 mg total) by mouth daily. 06/14/15  Henrietta Hoover, NP  ondansetron (ZOFRAN) 4 MG tablet Take 1 tablet (4 mg total) by mouth every 6 (six) hours. 01/23/16   Patel-Mills, Lorelle Formosa, PA-C  ondansetron (ZOFRAN) 4 MG tablet Take 1 tablet (4 mg total) by mouth every 6 (six) hours. 09/20/17   Wynetta Fines, MD  permethrin (ELIMITE) 5 % cream Apply to affected area once Patient not taking: Reported on 02/16/2021 01/03/17   Ward, Chase Picket, PA-C  sucralfate (CARAFATE) 1 GM/10ML suspension Take 10 mLs (1 g total) by mouth 4 (four) times daily -  with meals and at bedtime. 03/04/17   Long, Arlyss Repress, MD  tetrahydrozoline 0.05 % ophthalmic solution Place 2 drops into both eyes daily as needed (allergies).    [provider]  tobramycin (TOBREX) 0.3 %  ophthalmic solution Place 1 drop into the right eye every 6 (six) hours. 08/01/20   Mardella Layman, MD  lisinopril-hydrochlorothiazide (PRINZIDE,ZESTORETIC) 20-25 MG per tablet Take 1 tablet by mouth daily. 06/14/15 02/16/21  Henrietta Hoover, NP    Allergies    Morphine and related  Review of Systems   Review of Systems  Constitutional: Negative for chills and fever.  HENT: Negative for congestion.   Eyes: Negative for pain.  Respiratory: Negative for cough and shortness of breath.   Cardiovascular: Negative for chest pain and leg swelling.  Gastrointestinal: Negative for abdominal pain, diarrhea, nausea and vomiting.  Genitourinary: Negative for dysuria.  Musculoskeletal: Negative for myalgias.  Skin: Negative for rash.  Neurological: Positive for weakness. Negative for dizziness and headaches.       Right arm/leg weakness    Physical Exam Updated Vital Signs BP (!) 181/116 (BP Location: Left Arm)   Pulse (!) 53   Temp 98 F (36.7 C) (Oral)   Resp 15   SpO2 99%   Physical Exam Vitals and nursing note reviewed.  Constitutional:      General: He is not in acute distress.    Appearance: He is not toxic-appearing.  HENT:     Head: Normocephalic and atraumatic.     Nose: Nose normal.     Mouth/Throat:     Mouth: Mucous membranes are moist.  Eyes:     General: No scleral icterus. Cardiovascular:     Rate and Rhythm: Normal rate and regular rhythm.     Pulses: Normal pulses.     Heart sounds: Normal heart sounds.  Pulmonary:     Effort: Pulmonary effort is normal. No respiratory distress.     Breath sounds: No wheezing.  Abdominal:     Palpations: Abdomen is soft.     Tenderness: There is no abdominal tenderness. There is no guarding or rebound.  Musculoskeletal:     Cervical back: Normal range of motion.     Right lower leg: No edema.     Left lower leg: No edema.  Skin:    General: Skin is warm and dry.     Capillary Refill: Capillary refill takes less than 2  seconds.  Neurological:     Mental Status: He is alert. Mental status is at baseline.     Comments: Alert and oriented to self, place, time and event.   Speech is fluent, clear without dysarthria or dysphasia.   Strength 5/5 in left upper/lower extremities  Strength 4/5 in right upper/lower extremities   Sensation intact in upper/lower extremities   CN I not tested  CN II grossly intact visual fields bilaterally. Did not visualize posterior eye.   CN  III, IV, VI PERRLA and EOMs intact bilaterally  CN V Intact sensation to sharp and light touch to the face  CN VII facial movements symmetric  CN VIII not tested  CN IX, X no uvula deviation, symmetric rise of soft palate  CN XI 5/5 SCM and trapezius strength bilaterally  CN XII Midline tongue protrusion, symmetric L/R movements   Psychiatric:        Mood and Affect: Mood normal.        Behavior: Behavior normal.     ED Results / Procedures / Treatments   Labs (all labs ordered are listed, but only abnormal results are displayed) Labs Reviewed  COMPREHENSIVE METABOLIC PANEL - Abnormal; Notable for the following components:      Result Value   Sodium 133 (*)    Creatinine, Ser 1.27 (*)    Calcium 11.5 (*)    All other components within normal limits  URINALYSIS, ROUTINE W REFLEX MICROSCOPIC - Abnormal; Notable for the following components:   APPearance HAZY (*)    Hgb urine dipstick SMALL (*)    Leukocytes,Ua LARGE (*)    WBC, UA >50 (*)    All other components within normal limits  RESP PANEL BY RT-PCR (FLU A&B, COVID) ARPGX2  URINE CULTURE  ETHANOL  PROTIME-INR  APTT  CBC  DIFFERENTIAL  RAPID URINE DRUG SCREEN, HOSP PERFORMED  RPR  HIV ANTIBODY (ROUTINE TESTING W REFLEX)  LACTIC ACID, PLASMA  LACTIC ACID, PLASMA  CK  HEMOGLOBIN A1C  TSH  GC/CHLAMYDIA PROBE AMP () NOT AT [facility]  TROPONIN I (HIGH SENSITIVITY)  TROPONIN I (HIGH SENSITIVITY)    EKG EKG Interpretation  Date/Time:  Thursday Feb 27 2021 18:04:32 EDT Ventricular Rate:  67 PR Interval:  166 QRS Duration: 90 QT Interval:  380 QTC Calculation: 401 R Axis:   56 Text Interpretation: Normal sinus rhythm with sinus arrhythmia ST & T wave abnormality, consider lateral ischemia Confirmed by Nicanor Alcon, April ([id number]) on 02/28/2021 6:32:56 AM   Radiology DG Chest 2 View  Result Date: 02/28/2021 CLINICAL DATA:  Chest pain. EXAM: CHEST - 2 VIEW COMPARISON:  02/16/2021 FINDINGS: The lungs are clear without focal pneumonia, edema, pneumothorax or pleural effusion. Interstitial markings are diffusely coarsened with chronic features. The cardio pericardial silhouette is enlarged. The visualized bony structures of the thorax show no acute abnormality. Telemetry leads overlie the chest. IMPRESSION: No active cardiopulmonary disease. Electronically Signed   By: Kennith Center M.D.   On: 02/28/2021 07:32   CT HEAD WO CONTRAST  Result Date: 02/27/2021 CLINICAL DATA:  Right arm weakness over the last 2 days. Question stroke. EXAM: CT HEAD WITHOUT CONTRAST TECHNIQUE: Contiguous axial images were obtained from the base of the skull through the vertex without intravenous contrast. COMPARISON:  None. FINDINGS: Brain: No focal abnormality seen affecting the brainstem or cerebellum. Cerebral hemispheres show low-density within the white matter consistent with small vessel ischemic change. The age of these white matter insults is indeterminate. Whereas most are likely old, a recent white matter infarction is not excluded. No cortical or large vessel territory infarction is seen. No mass, hemorrhage, hydrocephalus or extra-axial collection. Vascular: There is atherosclerotic calcification of the major vessels at the base of the brain. Skull: Negative Sinuses/Orbits: Clear/normal Other: None IMPRESSION: No acute finding by CT. Areas of low-density in the cerebral hemispheric white matter consistent with small vessel ischemic change. This is presumed to be chronic,  but a recent small vessel infarction could be hidden within the  chronic insults. Electronically Signed   By: Paulina Fusi M.D.   On: 02/27/2021 19:05   MR BRAIN WO CONTRAST  Result Date: 02/27/2021 CLINICAL DATA:  Initial evaluation for neuro deficit, stroke suspected, right-sided weakness. EXAM: MRI HEAD WITHOUT CONTRAST TECHNIQUE: Multiplanar, multiecho pulse sequences of the brain and surrounding structures were obtained without intravenous contrast. COMPARISON:  Prior CT from earlier the same day. FINDINGS: Brain: Cerebral volume within normal limits for age. Patchy T2/FLAIR hyperintensity within the periventricular and deep white matter both cerebral hemispheres most consistent with chronic small vessel ischemic disease, moderate for age. Patchy involvement of the pons noted. Superimposed small remote lacunar infarct at the right thalamus. Multiple scattered cortical and subcortical foci of restricted diffusion seen involving the left cerebral hemisphere, with involvement of the left frontal, parietal, temporal, and occipital lobes. For reference purposes, the largest distinct area of infarction seen at the posterior left frontal corona radiata and measures approximately 1 cm. No associated hemorrhage or mass effect. No other evidence for acute or subacute ischemia. Gray-white matter differentiation otherwise maintained. Multiple scattered punctate chronic micro hemorrhages noted about the cerebellum and thalami, likely related to chronic underlying hypertension. No mass lesion, midline shift or mass effect. No hydrocephalus or extra-axial fluid collection. Pituitary gland suprasellar region within normal limits. Midline structures intact. Vascular: Major intracranial vascular flow voids are maintained. Skull and upper cervical spine: Craniocervical junction normal. Bone marrow signal intensity within normal limits. No scalp soft tissue abnormality. Sinuses/Orbits: Globes and orbital soft tissues within  normal limits. Scattered mucosal thickening noted within the ethmoidal air cells and maxillary sinuses. Paranasal sinuses are otherwise clear. Trace bilateral mastoid effusions, of doubtful significance. Other: None. IMPRESSION: 1. Multiple scattered cortical and subcortical acute ischemic nonhemorrhagic left cerebral infarcts as above. No associated hemorrhage or mass effect. 2. Underlying moderate chronic microvascular ischemic disease. 3. Multiple chronic micro hemorrhages clustered about the cerebellum and thalami, suggesting chronic poorly controlled hypertension. Electronically Signed   By: Rise Mu M.D.   On: 02/27/2021 22:53    Procedures .Critical Care Performed by: Gailen Shelter, PA Authorized by: Gailen Shelter, PA   Critical care provider statement:    Critical care time (minutes):  45   Critical care was necessary to treat or prevent imminent or life-threatening deterioration of the following conditions: stroke.   Critical care was time spent personally by me on the following activities:  Discussions with consultants, evaluation of patient's response to treatment, examination of patient, ordering and performing treatments and interventions, ordering and review of laboratory studies, ordering and review of radiographic studies, pulse oximetry, re-evaluation of patient's condition, obtaining history from patient or surrogate and review of old charts     Medications Ordered in ED Medications  cefTRIAXone (ROCEPHIN) 1 g in sodium chloride 0.9 % 100 mL IVPB (1 g Intravenous New Bag/Given 02/28/21 0856)  labetalol (NORMODYNE) injection 10 mg (10 mg Intravenous Given 02/28/21 0856)  nicotine (NICODERM CQ - dosed in mg/24 hours) patch 14 mg (14 mg Transdermal Patch Applied 02/28/21 0857)    ED Course  I have reviewed the triage vital signs and the nursing notes.  Pertinent labs & imaging results that were available during my care of the patient were reviewed by me and  considered in my medical decision making (see chart for details).    MDM Rules/Calculators/A&P                          Patient  is a 50 year old male noncompliant with his blood pressure medications  Patient presented today well outside of any stroke window with symptoms of right upper and right lower extremity weakness.  He denies any dysarthria difficulty swallowing, or confusion.  His symptoms have been persistent since onset when he woke up Wednesday morning with the symptoms.  He denies any chest pain shortness breath nausea vomiting dysuria frequency urgency.  Denies any other significant associated symptoms.  Is not a very forthcoming historian.  Seems that he has been off his blood pressure occasions for some time.  Patient with abnormal neurologic exam.  Right upper and right lower extremity weakness.  Again while outside of window for intervention on acute stroke on presentation to the ER therefore was triaged appropriately and MRI was obtained.  CT head unremarkable.  MRI of brain shows acute ischemic stroke.  Urinalysis with large leukocytes and greater than 50 WBCs.  No bacteria seen.  Will culture urine and treat empirically.  CMP creatinine 1.27.  No other significant abnormalities apart from some elevated calcium.  CBC and differential unremarkable.  Coags without abnormalities.  COVID test and influenza negative.  Ethanol undetectably low.  Urine drug screen negative.  Troponin 15.  Troponin obtained given the somewhat abnormal EKG however no significant evidence of ischemia patient is not having any chest pain.  Discussed with neurology who will assess patient at bedside. -- Recommendations were given.  Discussed with hospitalist who will admit.  Final Clinical Impression(s) / ED Diagnoses Final diagnoses:  Urinary tract infection without hematuria, site unspecified  Abnormal EKG  Cerebrovascular accident (CVA), unspecified mechanism [facility])    Rx / DC Orders ED  Discharge Orders    None       Gailen Shelter, [place] 02/28/21 1043    Palumbo, April, MD 03/04/21 1633

## 2021-02-28 NOTE — ED Notes (Signed)
Refused vitals 

## 2021-02-28 NOTE — H&P (Addendum)
Date: 02/28/2021               Patient Name:  Charles Callahan MRN: [id number]  DOB: 10/31/1970 Age / Sex: 50 y.o., male   PCP: Charles Callahan (Inactive)         Medical Service: Internal Medicine Teaching Service         Attending Physician: Dr. Mikey Bussing    First Contact: Dr. Leone Haven Pager: [phone]  Second Contact: Sande Brothers, MD, Viviann Spare Pager: [phone])       After Hours (After 5p/  First Contact Pager: [phone]  weekends / holidays): Second Contact Pager: [phone]   Chief Complaint: Right arm and leg weakness  History of Present Illness: Charles Callahan is a 50 year old gentleman with medical history significant for poorly controlled hypertension, GERD, cocaine use disorder, tobacco disorder  who is presenting to the emergency department today with right arm and leg weakness.  He states that he was in his usual state of health until about Wednesday early morning when he began experiencing right arm and leg weakness soon after waking up.  He states that at onset his mom checked his blood pressure which was 250s over 110s.  He has not been on any antihypertensives because he cannot afford the medication.  At the onset of his weakness, he experienced diffuse 10/10 sharp headache which has somewhat improved.  He endorsed tingling and numbness of the right upper extremity.  He denies fevers, chills, nausea, vomiting, diarrhea, constipation, urinary or bowel incontinence or shortness of breath however states that he has been told that he snores and sometimes experiences apneic spells at night when he sleeps. Currently he endorses epigastric pain which he states occurs occasionally due to his acid reflux  It appears that he was seen in the emergency department on Feb 16, 2021 with chest pain after unfortunately relapse and on cocaine use.  During that visit, he was found to be in hypertensive emergency with a blood pressure of 220/110 as well as mildly elevated troponin.  It was advised that  patient be admitted for further management however he refused admission and was discharged on his home antihypertensives.   Lab Orders     Resp Panel by RT-PCR (Flu A&B, Covid) Nasopharyngeal Swab     Urine culture     Ethanol     Protime-INR     APTT     CBC     Differential     Comprehensive metabolic panel     Urine rapid drug screen (hosp performed)     Urinalysis, Routine w reflex microscopic     RPR     HIV Antibody (routine testing w rflx)     Lactic acid, plasma     CK     Lipid panel     Hemoglobin A1c     TSH     Comprehensive metabolic panel     CBC   Meds:  None presently   Allergies: Allergies as of 02/27/2021 - Review Complete 02/27/2021  Allergen Reaction Noted   Morphine and related Nausea And Vomiting 04/26/2012   Past Medical History:  Diagnosis Date   CHF (congestive heart failure) (HCC)    Hypertension     Family History: Mother with diabetes mellitus, uncle with CVA  Social History: Lives in [place] with his mom.  He separated from his wife though they are still married.  He has a daughter who lives with her mom.  Denies current alcohol use,  last use was 6 months ago.  Smokes half a pack of cigarettes Callahan day has been doing so since age 34.  Endorse cocaine use and last used it last month.  Review of Systems: A complete ROS was negative except as Callahan HPI.   Physical Exam: Blood pressure (!) 181/116, pulse (!) 53, temperature 98 F (36.7 C), temperature source Oral, resp. rate 15, SpO2 99 %. Physical Exam Vitals and nursing note reviewed.  HENT:     Head: Normocephalic and atraumatic.     Mouth/Throat:     Mouth: Mucous membranes are moist.  Eyes:     Conjunctiva/sclera: Conjunctivae normal.  Cardiovascular:     Rate and Rhythm: Normal rate.     Pulses: Normal pulses.     Heart sounds: Normal heart sounds. No murmur heard.   Pulmonary:     Breath sounds: Normal breath sounds.  Abdominal:     General: Abdomen is flat. Bowel  sounds are normal.     Palpations: Abdomen is soft.     Tenderness: There is no abdominal tenderness.  Musculoskeletal:        General: No swelling.     Cervical back: Neck supple.  Skin:    General: Skin is warm.  Neurological:     Mental Status: He is alert.     Comments: Neurologic exam: Mental status: A&O Cranial Nerves:             II: PERRL             III, IV, VI: Extra-occular motions intact bilaterally             V, VII: Face symmetric, sensation DECREASED ON THE RIGHT            VIII: hearing normal to rubbing fingers bilaterally               IX, X: palate rises symmetrically             XI: Head turn and shoulder shrug DECREASED ON RIGHT             XII: tongue midline    Motor:  --2/5 on RUE and RLE --5/5 on LUE and LLR Gait: Unable to access  Sensory: Light touch intact and symmetric BUT DECREASED ON THE RIGHT Coordination: Dysmetria on finger-to-nose at the RUE. Reflex: Intact patella, Bicep, tricep Psychiatric: Normal mood and affect  Psychiatric:        Mood and Affect: Mood normal.        Behavior: Behavior normal.     EKG: personally reviewed my interpretation is SR, TWI in lateral leads (I,II).   CXR: personally reviewed my interpretation is unremarkable  Assessment & Plan by Problem: Principal Problem:   Acute CVA (cerebrovascular accident) [facility]) Active Problems:   Hypertension   Substance use disorder  [name] is a 50 year old gentleman with medical history significant for poorly controlled hypertension, GERD, cocaine use disorder (last use 1 month ago), tobacco disorder here for management of acute CVA  #Acute scattered cortical and subcortical nonhemorrhagic CVA Given the diffuse nature of his cerebral infarcts, possibility of an embolic source should be considered. Etiology likely secondary to underlying uncontrolled hypertension. Echocardiogram preformed in 2010 showed EF 55% to 60%, normal Left atrial size -Neurology consult -Allow for  permissive HTN in the setting of acute CVA (systolic < 220 and diastolic < 120). If no tPA. If given tPA (systolic <185 and diastolic <110 prior to treatment, and <180/105 in the  first 24 hrs following thrombolytic therapy  -ASA 81 mg plus Plavix 75 mg daily for 3 weeks then aspirin 81 mg thereafter -Lipitor 80mg  daily -Echocardiogram  -Carotid doppler or CTA head & neck  -A1C  -Lipid panel  -Tele monitoring  -SLP eval -PT/OT   #Hx of Uncontrolled Hypertension BP on arrival 188/122.  We will allow for permissive hypertension and only treat if SBP greater than 220, DBP greater than 120 -IV hydralazine as needed    #Mild hypercalcemia Serum calcium of 11.5 -1 L LR bolus - If persists, will order PTH and vitamin D   #Heme Positive Urine Dip (no microscopic hematuria) #Pyuria #Asymptomatic bacteriuria He denies any signs of symptoms of urinary tract infection/cystitis.  He received ceftriaxone x1 dose in the ED -Follow-up urine culture - Follow-up CK   #EKG changes EKG shows T wave inversion in lateral leads I and II. Denies Cp or SOB presently -Follow-up troponin   #Tobacco use disorder -NicoDerm -If patient agreeable, can consider addition of Chantix   #High suspicion of OSA Endorses snoring and apnea -Requires outpatient polysomnography after discharge    FEN: N.p.o., LR bolus VTE ppx: Subcutaneous Lovenox CODE STATUS: Full code  Prior to Admission Living Arrangement: Home Anticipated Discharge Location: Pending Barriers to Discharge: Treatment of acute CVA  Dispo: Admit patient to Inpatient with expected length of stay greater than 2 midnights.  Signed: , MD 02/28/2021, 9:17 AM  Pager: [phone] Internal Medicine Teaching Service After 5pm on weekdays and 1pm on weekends: On Call pager: [phone]

## 2021-02-28 NOTE — ED Notes (Signed)
Pt provided with lunch tray.

## 2021-02-28 NOTE — Consult Note (Addendum)
Neurology consult   CC: infarcts seen on MRI brain  History is obtained from: Patient  HPI: [name] is a 50 yo male with a PMHx of HTN and CdHF, and stroke (listed on chart but patient denies) who presents today with a 1-2 day history of right sided weakness and numbness. He had some numbness on and off on the right side on Wednesday, including his face, but did not seek care. States he went to bed on Wednesday night and when he woke up around 0700 hrs yesterday, he was weak on the right side. He stated he had difficulty holding items, like a cup, in his left hand, and difficulty walking due to RLE weakness. He did not fall. Admits to "black dots" in his vision, unrelieved by covering either eye, and is worse in his OD.  He states he has not been to a doctor in 4-5 year, nor has he taken any medications, including antihypertensives. States he lost his job and has not had insurance and could not afford his medications. He had a "thick tongue" and was not able to talk normally. Denies aphasia. No bowel or bladder incontinence.   No personal history of stroke per patient. FMHx + for stroke in 2 maternal uncles. Patient has never had cardiac or carotid stenting, brain surgery, brain bleed, or brain trauma. Denies history of DM II or seizures.   In review of chart, an echocardiogram in 2010 performed due to CP and SOB, showed: LVH, EF 55-60%, grade II diastolic dysfunction, and trivial mitral valve regurgitation. 07/2020, he was treated at [facility] for a stye and BP was noted to be in the 170s. OV to NP in 2016 with noted hx of stroke vs TIA, but this NP does not see any notes from a neurologist in the past. It appears that in 2016, he was put on Lisinopril/HCTZ 20/25 I po qd, but there is no note of f/up. No notes in care everywhere.   Patient lives with his mother and performs all ADLs independently. MRS 0. No tPA due to being outside the time window.   Patient states he will need financial help with any  medications.   LKW: 2 days ago tpa given?: No, outside window MRS 0  NIHSS:  1a Level of Consciousness: 0 1b LOC Questions: 0 1c LOC Commands: 0 2 Best Gaze: 0 3 Visual: 0 4 Facial Palsy: 1 5a Motor Arm - left: 0 5b Motor Arm - Right: 2 6a Motor Leg - Left: 0 6b Motor Leg - Right: 1 7 Limb Ataxia: 2 8 Sensory: 2 9 Best Language: 0 10 Dysarthria: 0 11 Extinction and Inattention: 0 TOTAL:  8  ROS: A robust ROS was performed and is negative except as noted in the HPI.  Past Medical History:  Diagnosis Date   CHF (congestive heart failure) (HCC)    Hypertension   ? Stroke vs TIA   Family History  Problem Relation Age of Onset   Diabetes Mother    Hypertension Mother   stroke in 2 maternal uncles  Social History:  reports that he has been smoking cigarettes. He has been smoking about 1.00 pack per day. He has never used smokeless tobacco. He reports current alcohol use. He reports current drug use. Drug: Cocaine.  PTA medication list:  Tums prn indigestion.    Exam: Current vital signs: BP (!) 169/92   Pulse 88   Temp 98 F (36.7 C)   Resp 18   SpO2 98%  Physical Exam  Constitutional: Appears well-developed and well-nourished.  Psych: Affect appropriate to situation. Shy, reluctant at times.  Eyes: No scleral injection. Subjective black dots with vision, worse in OD.  HENT: No OP obstrucion Head: Normocephalic.  Cardiovascular: Normal rate and regular rhythm on tele.  Respiratory: Effort normal and breath sounds normal to anterior ascultation GI: Soft.  No distension. There is no tenderness.  Skin: WDI  Neuro: Mental Status: Patient is awake, alert, oriented to person, place, month, year, and situation. Patient is able to give a clear and coherent history. No signs of neglect. Speech/Language:  Speech is clear, fluent without dysarthria or aphasia. Repetition, naming, and comprehension intact.  Cranial Nerves: II: Visual Fields are full. Pupils are  69mm equal, round, and reactive to light.  III,IV, VI: EOMI without ptosis or diploplia.  V: Facial sensation is symmetric to light touch in V1, V2, and V3 VII: Droop slight right. Puffing cheeks is less on the right and raising eyebrows are symmetrical.  VIII: hearing is intact to voice. X: Uvula elevates symmetrically. XI: Shoulder shrug is symmetric. XII: tongue is midline without atrophy or fasciculations.  Motor: RUE:  grips  3+/5     biceps 3+/5     triceps  3+/5 LUE:  grips   5/5    biceps  5/5     triceps  5/5 RLE: knee  4+/5     thigh   4/5     plantar flexion   4-/5     dorsiflexion  4-/5 LLE: knee  5/5     thigh   5/5      plantar flexion   5/5     dorsiflexion  5/5 Tone is somewhat flaccid on RUE. Bulk is normal.   Sensory: Sensation is decreased to light touch to right face, RUE, RLE.  Deep Tendon Reflexes: RUE:  Brachioradialis 1+     RLE: patella 1+ LUE: brachioradialis  2+    LLE: patella 2+ Plantars: Toes are downgoing bilaterally.  Cerebellar: HKS and FNF are intact on the left. Unable to perform on RUE, RLE due to weakness.   I have reviewed labs in epic and the pertinent results are: INR  .9        aPTT   33        MD reviewed the images obtained:  NCT head  No acute finding by CT. Areas of low-density in the cerebral hemispheric white matter consistent with small vessel ischemic change. This is presumed to be chronic, but a recent small vessel infarction could be hidden within the chronic insults.  MRI brain 1. Multiple scattered cortical and subcortical acute ischemic nonhemorrhagic left cerebral infarcts as above. No associated hemorrhage or mass effect. 2. Underlying moderate chronic microvascular ischemic disease. 3. Multiple chronic micro hemorrhages clustered about the cerebellum and thalami, suggesting chronic poorly controlled hypertension.   CTA head and neck ordered.   Assessment: 50 yo male who presented with RUE/RLE weakness and numbness  x 24-48 hours. Stroke risk factors include HTN with non adherence due to no insurance and lack of funds, non adherence with MD visits for same reason as above, and tobacco use.  -He denies history of stroke, but it is listed in his PMHx on notes in the chart. No neurology notes or f/up.  -ED workup revealed chronic small vessel ischemic disease likely due to untreated HTN. MRI brain revealed multiple scattered cortical and subcortical acute ischemic, non hemmorhagic left cerebral infarcts with multiple chronic microvascular  hemorrhagic clusters about the cerebellum and thalami.  -We will get a CTA head and neck to evaluate thrombosis, significant stenosis, or obstruction of intra/extracranial vessels.  -Patient will be admitted for full stroke workup.   Impression:  1. CVA 2. Chronic ischemic microvascular changes/hemorrhagic clusters.  3. Tobacco abuse. 4. Non adherence due to no insurance and no funds.  5. History of cocaine abuse, with negative UDS.  6. UTI  Plan: - Medicine admit. -CTA head and neck-ordered - Recommend TTE with bubble study. - labs: HbA1c, lipid panel, TSH. - Recommend Statin if LDL > 70 - Aspirin 81mg  daily. - Clopidogrel load of 300mg  PO once and plavix 75mg  daily for 90 days. - SBP goal - Permissive hypertension first 24 h < 220/110. Hold home medications for now. - Telemetry monitoring for arrhythmia. - bedside Swallow screen. - Stroke education. - PT/OT/SLP consult. - NIHSS as per protocol. - frequent neuro checks.  -tobacco cessation education.  -Nicoderm patch.  -UTI treatment by medicine/ED.  -Social work/case management for help with medications. - Recommend metabolic/infectious workup with UA with UCx, CXR, CK, serum lactate.   Electronically signed by: , MSN, APN-BC, nurse practitioner and by MD. Note/plan to be edited by MD as needed.  Pager: [phone]

## 2021-03-01 ENCOUNTER — Inpatient Hospital Stay (HOSPITAL_COMMUNITY): Payer: Medicaid Other

## 2021-03-01 DIAGNOSIS — I6389 Other cerebral infarction: Secondary | ICD-10-CM

## 2021-03-01 LAB — COMPREHENSIVE METABOLIC PANEL
ALT: 19 U/L (ref 0–44)
AST: 14 U/L — ABNORMAL LOW (ref 15–41)
Albumin: 3 g/dL — ABNORMAL LOW (ref 3.5–5.0)
Alkaline Phosphatase: 59 U/L (ref 38–126)
Anion gap: 5 (ref 5–15)
BUN: 8 mg/dL (ref 6–20)
CO2: 25 mmol/L (ref 22–32)
Calcium: 10.5 mg/dL — ABNORMAL HIGH (ref 8.9–10.3)
Chloride: 105 mmol/L (ref 98–111)
Creatinine, Ser: 1 mg/dL (ref 0.61–1.24)
GFR, Estimated: >60 mL/min (ref >60)
Glucose, Bld: 104 mg/dL — ABNORMAL HIGH (ref 70–99)
Potassium: 3.7 mmol/L (ref 3.5–5.1)
Sodium: 135 mmol/L (ref 135–145)
Total Bilirubin: 0.8 mg/dL (ref 0.3–1.2)
Total Protein: 5.7 g/dL — ABNORMAL LOW (ref 6.5–8.1)

## 2021-03-01 LAB — CBC
HCT: 41 % (ref 39.0–52.0)
Hemoglobin: 14 g/dL (ref 13.0–17.0)
MCH: 31.3 pg (ref 26.0–34.0)
MCHC: 34.1 g/dL (ref 30.0–36.0)
MCV: 91.7 fL (ref 80.0–100.0)
Platelets: 222 10*3/uL (ref 150–400)
RBC: 4.47 MIL/uL (ref 4.22–5.81)
RDW: 13.7 % (ref 11.5–15.5)
WBC: 4.1 10*3/uL (ref 4.0–10.5)
nRBC: 0 % (ref 0.0–0.2)

## 2021-03-01 LAB — ECHOCARDIOGRAM COMPLETE BUBBLE STUDY
Area-P 1/2: 4.8 cm2
S' Lateral: 3.6 cm

## 2021-03-01 LAB — LIPID PANEL
Cholesterol: 143 mg/dL (ref 0–200)
HDL: 32 mg/dL — ABNORMAL LOW (ref >40)
LDL Cholesterol: 102 mg/dL — ABNORMAL HIGH (ref 0–99)
Total CHOL/HDL Ratio: 4.5 RATIO
Triglycerides: 46 mg/dL (ref <150)
VLDL: 9 mg/dL (ref 0–40)

## 2021-03-01 LAB — URINE CULTURE: Culture: <10000 — AB

## 2021-03-01 MED ORDER — ASPIRIN EC 325 MG PO TBEC
325.0000 mg | DELAYED_RELEASE_TABLET | Freq: Every day | ORAL | Status: DC
  Administered 2021-03-02 – 2021-03-05 (×4): 325 mg via ORAL
  Filled 2021-03-01 (×4): qty 1

## 2021-03-01 NOTE — Progress Notes (Signed)
*  PRELIMINARY RESULTS* Echocardiogram 2D Echocardiogram has been performed.  Stacey Drain 03/01/2021, 12:13 PM

## 2021-03-01 NOTE — Progress Notes (Signed)
  Speech Language Pathology  Patient Details Name: Charles Callahan MRN: [id number] DOB: Aug 07, 1971 Today's Date: 03/01/2021 Time:  -     Pt passed [facility] swallow screen- no need for formal assessment with this SLP. Will initiate speech-language-cognitive eval as able.                         Royce Macadamia 03/01/2021, 10:07 AM Pager [phone]

## 2021-03-01 NOTE — TOC Initial Note (Signed)
Transition of Care [facility]) - Initial/Assessment Note    Patient Details  Name: Charles Callahan MRN: [id number] Date of Birth: Feb 19, 1971  Transition of Care [facility]) CM/SW Contact:    Lawerance Sabal, RN Phone Number: 03/01/2021, 2:02 PM  Clinical Narrative:           Sherron Monday w patient at bedside. Cage Aid done. Patient admitted to drug use 2 weeks ago, denied ETOH use. Provided with resources. Patient verifies that he is uninsured. He lives at his mother's house, but sometimes sleeps in her car. Address correct, he states he will DC to there.  His sister provides transportation for him when needed, she was present for some of the assessment and he granted permission to speak freely in front of her. He does not have primary care. [facility] added to AVS. Patient informed he can use low cost pharmacy there once he has an appointment made, he will need to schedule appointment, office not open on weekends. Will follow for MATCH needs at DC.           Barriers to Discharge: Continued Medical Work up   Patient Goals and CMS Choice Patient states their goals for this hospitalization and ongoing recovery are:: return home tohis mother's house      Expected Discharge Plan and Services   In-house Referral: Clinical Social Work Discharge Planning Services: CM Consult   Living arrangements for the past 2 months: Single Family Home                                      Prior Living Arrangements/Services Living arrangements for the past 2 months: Single Family Home   Patient language and need for interpreter reviewed:: No Do you feel safe going back to the place where you live?: Yes            Criminal Activity/Legal Involvement Pertinent to Current Situation/Hospitalization: No - Comment as needed  Activities of Daily Living      Permission Sought/Granted                  Emotional Assessment              Admission diagnosis:  Abnormal EKG [R94.31] Acute CVA  (cerebrovascular accident) (HCC) [I63.9] Urinary tract infection without hematuria, site unspecified [N39.0] Cerebrovascular accident (CVA), unspecified mechanism (HCC) [I63.9] Patient Active Problem List   Diagnosis Date Noted  . Acute CVA (cerebrovascular accident) (HCC) 02/28/2021  . Substance use disorder 02/28/2021  . OBESITY 05/13/2009  . Hypertension 05/13/2009  . STROKE 05/13/2009  . BACK PAIN 05/13/2009  . SHORTNESS OF BREATH 05/13/2009  . CHEST PAIN 05/13/2009   PCP:  Patient, No Pcp Per (Inactive) Pharmacy:   [facility] [address] [place] - [address] [place] [address] Phone: [phone] Fax: [phone]     Social Determinants of Health (SDOH) Interventions    Readmission Risk Interventions No flowsheet data found.

## 2021-03-01 NOTE — Progress Notes (Signed)
Physical Therapy Treatment Patient Details Name: Charles Callahan MRN: [id number] DOB: 1970/11/22 Today's Date: 03/01/2021    History of Present Illness Pt is a 50 y/o male admitted 5/12 secondary to worsening  R UE and RLE weakness, numbness. Imaging revealed multiple L hemisphere infarcts. PMH includes HTN, tobacco use, and substance abuse.    PT Comments    Patient is progressing well towards physical therapy goals, ambulating up to 20 feet this afternoon with Mod assist initially, progressing to min assist for RW control and RLE sequencing cues. Tolerated exercises EOB and in bed to continue performing intermittently on his own as able. Patient will continue to benefit from skilled physical therapy services to further improve independence with functional mobility.     Follow Up Recommendations  Supervision for mobility/OOB;CIR     Equipment Recommendations  Other (comment) (TBD pending progress and dispo)    Recommendations for Other Services       Precautions / Restrictions Precautions Precautions: Fall    Mobility  Bed Mobility Overal bed mobility: Needs Assistance Bed Mobility: Supine to Sit     Supine to sit: Min assist Sit to supine: Min guard   General bed mobility comments: min assist for trunk support and to square hips at EOB, leaning a bit to Lt, having some instability, aware but difficult correcting at first.Cues for using LLE as needed to assist RLE back into bed.    Transfers Overall transfer level: Needs assistance Equipment used: Rolling walker (2 wheeled) Transfers: Sit to/from Stand Sit to Stand: Min assist Stand pivot transfers: Min guard       General transfer comment: Min assist for RUE and RLE placement prior to rising. Educated on technique, sequencing, wt shift. Cues for safety with reaching back prior to sitting. Performed several times from bed.  Ambulation/Gait Ambulation/Gait assistance: Min assist;Mod assist Gait Distance (Feet): 20  Feet Assistive device: Rolling walker (2 wheeled) Gait Pattern/deviations: Step-to pattern;Step-through pattern;Decreased stance time - right;Decreased stride length;Decreased dorsiflexion - right;Decreased weight shift to right;Decreased step length - left;Narrow base of support Gait velocity: slow Gait velocity interpretation: <1.31 ft/sec, indicative of household ambulator General Gait Details: Pre-gait weight shifting, accepting weight RLE, knee block, progressed to lifting LLE and fully supporting weight with RLE, mild knee instability noted but without overt buckle. Mod assist to ambulate initially requiring assist for RUE control on RW, Naviging and progressing RW forward and backwards, and slight assist with Rt foot advancement. Pt progressed to min assist with control for RW only, Cues for sequencing, step to pattern advanced to slight alternating step pattern, cues for wider BOS, and forward gaze intermittently.   Stairs             Wheelchair Mobility    Modified Rankin (Stroke Patients Only) Modified Rankin (Stroke Patients Only) Pre-Morbid Rankin Score: No symptoms Modified Rankin: Moderately severe disability     Balance Overall balance assessment: Needs assistance Sitting-balance support: No upper extremity supported;Feet supported Sitting balance-Leahy Scale: Good     Standing balance support: Single extremity supported;During functional activity Standing balance-Leahy Scale: Poor Standing balance comment: Reliant on UE and external support                            Cognition Arousal/Alertness: Awake/alert Behavior During Therapy: WFL for tasks assessed/performed Overall Cognitive Status: Within Functional Limits for tasks assessed  General Comments: sister in room visiting, she reports noticing an improvement in his speech today compared to yesterday.      Exercises General Exercises - Lower  Extremity Ankle Circles/Pumps: AROM;Both;15 reps Long Arc Quad: AAROM;Right;10 reps;Seated (completes 75% of range unassisted.) Heel Slides: AAROM;Right;10 reps Hip ABduction/ADduction: Strengthening;Both;10 reps;Seated Straight Leg Raises: AAROM;Right;10 reps Hip Flexion/Marching: AAROM;Right;10 reps;Seated    General Comments        Pertinent Vitals/Pain Pain Assessment: 0-10 Pain Score: 3  Pain Location: Rt shoulder Pain Descriptors / Indicators: Aching Pain Intervention(s): Monitored during session;Repositioned (RUE supported to approximate GH jt)    Home Living                      Prior Function            PT Goals (current goals can now be found in the care plan section) Acute Rehab PT Goals PT Goal Formulation: With patient Time For Goal Achievement: 03/14/21 Potential to Achieve Goals: Good Progress towards PT goals: Progressing toward goals    Frequency    Min 4X/week      PT Plan Current plan remains appropriate    Co-evaluation              AM-PAC PT "6 Clicks" Mobility   Outcome Measure  Help needed turning from your back to your side while in a flat bed without using bedrails?: A Little Help needed moving from lying on your back to sitting on the side of a flat bed without using bedrails?: A Little Help needed moving to and from a bed to a chair (including a wheelchair)?: A Little Help needed standing up from a chair using your arms (e.g., wheelchair or bedside chair)?: A Little Help needed to walk in hospital room?: A Lot Help needed climbing 3-5 steps with a railing? : Total 6 Click Score: 15    End of Session Equipment Utilized During Treatment: Gait belt Activity Tolerance: Patient tolerated treatment well Patient left: in bed;with call bell/phone within reach;with family/visitor present;with bed alarm set   PT Visit Diagnosis: Hemiplegia and hemiparesis;Unsteadiness on feet (R26.81);Difficulty in walking, not elsewhere  classified (R26.2) Hemiplegia - Right/Left: Right Hemiplegia - dominant/non-dominant: Dominant Hemiplegia - caused by: Cerebral infarction     Time: 4580-9983 PT Time Calculation (min) (ACUTE ONLY): 27 min  Charges:  $Gait Training: 8-22 mins $Therapeutic Exercise: 8-22 mins                     [name], PT, DPT   [name] 03/01/2021, 2:58 PM

## 2021-03-01 NOTE — Progress Notes (Signed)
   Subjective:  O/N Events: None  Seen at bedside. States he is doing well, but would like to move his R hand more and still having some numbness.    Objective:  Vital signs in last 24 hours: Vitals:   02/28/21 2130 02/28/21 2312 03/01/21 0312 03/01/21 0749  BP: (!) 189/95 (!) 175/101 (!) 159/94 (!) 174/93  Pulse: 65 81 62 60  Resp: 20 17 17 18   Temp:  98.1 F (36.7 C) 98.2 F (36.8 C) 98.2 F (36.8 C)  TempSrc:  Oral Oral Oral  SpO2: 100% 94% 94% 98%   Physical Exam Constitutional:      General: He is not in acute distress.    Appearance: Normal appearance. He is not diaphoretic.  Cardiovascular:     Rate and Rhythm: Normal rate and regular rhythm.     Pulses: Normal pulses.     Heart sounds: Normal heart sounds. No murmur heard. No friction rub. No gallop.   Pulmonary:     Effort: Pulmonary effort is normal.     Breath sounds: Normal breath sounds. No wheezing, rhonchi or rales.  Musculoskeletal:     Comments: 3/5 strength in RUE and RLE 5/5 strength in LUE and LLE  Neurological:     Mental Status: He is alert and oriented to person, place, and time.     Sensory: Sensory deficit present.     Comments: Decreased sensation on the R side of face, otherwise CN II-XII grossly intact.     Assessment/Plan:  Principal Problem:   Acute CVA (cerebrovascular accident) [facility]) Active Problems:   Hypertension   Substance use disorder  Mr. Infinger is a 50 year old gentleman with medical history significant for poorly controlled hypertension, GERD, cocaine use disorder (last use 1 month ago), tobacco disorder here for management of acute CVA  Acute Scattered Cortical and Subcortical Nonhemorrhagic CVA CT angio showing severe L M1 MCA stenosis. Started on Plavix and ASA, PT/ OT recommending CIR placement. SLP will initiate speech-language-cognitive eval when able. Pressures are elevated, but will continue permissive HTN. Lipid pane showed mildly elevated LDL(102), and mildly  decreased HDL (32).  - Appreciate Neurology's recommendations - Permissive HTN (systolic <220 and diastolic <120).  - Continue ASA 81 mg plus Plavix 75 mg daily for 3 weeks then stand alone aspirin 81 mg  - Lipitor 80mg  daily - Echocardiogram: Pending   Thyroid Mass:  Incidental finding on CT angio. Follow up with U/S.  - U/S results pending.    #Hx of Uncontrolled Hypertension Continuing permissive hypertension and only treat if SBP greater than 220, DBP greater than 120 - IV hydralazine PRN   #Mild hypercalcemia Serum calcium of 11.5 improved to 10.5 after fluid bolus - Trend CMP -  If persists, will order PTH and vitamin D  #Pyuria #Heme (+) Urine Received 1 dose of Rocephin in ED. U/A shows no bacteria, urine culture shows insignificant growth. CK negative. No further intervention.   #EKG changes EKG shows T wave inversion in lateral leads I and II. No ACS symptoms. Troponin levels flat.   #Tobacco use disorder Current half pack day smoker.  - Nicotine 14 mg patch  #High suspicion of OSA Hx of snoring and apnea.  - Outpatient sleep study   Prior to Admission Living Arrangement: Home Anticipated Discharge Location:Pending Barriers to Discharge: Continued medical management.   44, MD 03/01/2021, 10:40 AM Pager: [phone] After 5pm on weekdays and 1pm on weekends: On Call pager [phone]

## 2021-03-01 NOTE — Evaluation (Signed)
Occupational Therapy Evaluation Patient Details Name: Charles Callahan MRN: [id number] DOB: 1971-07-02 Today's Date: 03/01/2021    History of Present Illness Pt is a 50 y/o male admitted 5/12 secondary to worsening  R UE and RLE weakness, numbness. Imaging revealed multiple L hemisphere infarcts. PMH includes HTN, tobacco use, and substance abuse.   Clinical Impression   Patient admitted for the diagnosis above.  PTA he was independent with all care and mobility.  He currently lives at home with his mother, who is able to assist as needed.  Barriers are listed below.  Currently he is needing hand held Min A of one for mobility, and up to Mod A for ADL completion from a sit/stand level.  Given his high prior level, motivation, and ability to participate with intensive rehab, CIR has been recommended.  OT will continue to follow to maximize his functional status.      Follow Up Recommendations  CIR    Equipment Recommendations  Tub/shower seat    Recommendations for Other Services       Precautions / Restrictions Precautions Precautions: Fall Restrictions Weight Bearing Restrictions: No      Mobility Bed Mobility Overal bed mobility: Needs Assistance Bed Mobility: Supine to Sit     Supine to sit: Min assist     General bed mobility comments: assist to advance RLE Patient Response: Cooperative  Transfers Overall transfer level: Needs assistance Equipment used: 1 person hand held assist Transfers: Sit to/from [employer] Sit to Stand: Min assist Stand pivot transfers: Min assist            Balance Overall balance assessment: Needs assistance Sitting-balance support: No upper extremity supported;Feet supported Sitting balance-Leahy Scale: Good     Standing balance support: Single extremity supported;During functional activity Standing balance-Leahy Scale: Poor Standing balance comment: Reliant on UE and external support                            ADL either performed or assessed with clinical judgement   ADL Overall ADL's : Needs assistance/impaired Eating/Feeding: Minimal assistance;With adaptive utensils Eating/Feeding Details (indicate cue type and reason): increased spillage with use of R hand.             Upper Body Dressing : Minimal assistance;Sitting   Lower Body Dressing: Minimal assistance;Sit to/from stand               Functional mobility during ADLs: Minimal assistance General ADL Comments: hand held assist     Vision Baseline Vision/History: Wears glasses Patient Visual Report: No change from baseline Additional Comments: does not have glasses in the hospital     Perception     Praxis      Pertinent Vitals/Pain Pain Assessment: No/denies pain     Hand Dominance Right   Extremity/Trunk Assessment Upper Extremity Assessment Upper Extremity Assessment: RUE deficits/detail RUE Deficits / Details: grossly 2/5 to RUE RUE Sensation: decreased light touch RUE Coordination: decreased fine motor;decreased gross motor       Cervical / Trunk Assessment Cervical / Trunk Assessment: Normal   Communication Communication Communication: No difficulties   Cognition Arousal/Alertness: Awake/alert Behavior During Therapy: WFL for tasks assessed/performed Overall Cognitive Status: Within Functional Limits for tasks assessed  Home Living Family/patient expects to be discharged to:: Private residence Living Arrangements: Parent Available Help at Discharge: Family;Available PRN/intermittently Type of Home: House Home Access: Level entry     Home Layout: One level     Bathroom Shower/Tub: Chief Strategy Officer: Standard     Home Equipment: None          Prior Functioning/Environment Level of Independence: Independent                 OT Problem List: Decreased strength;Decreased range of  motion;Impaired balance (sitting and/or standing);Decreased knowledge of use of DME or AE;Impaired UE functional use      OT Treatment/Interventions: Self-care/ADL training;Therapeutic exercise;Neuromuscular education;DME and/or AE instruction;Balance training;Therapeutic activities    OT Goals(Current goals can be found in the care plan section) Acute Rehab OT Goals Patient Stated Goal: to get my leg and arm moving OT Goal Formulation: With patient Time For Goal Achievement: 03/15/21 Potential to Achieve Goals: Good ADL Goals Pt Will Perform Grooming: with set-up;sitting;standing Pt Will Perform Upper Body Bathing: with set-up;sitting;standing Pt Will Perform Lower Body Bathing: with set-up;sit to/from stand Pt Will Perform Upper Body Dressing: with set-up;sitting;standing Pt Will Perform Lower Body Dressing: with set-up;sit to/from stand Pt Will Transfer to Toilet: with supervision;ambulating;regular height toilet Pt Will Perform Toileting - Clothing Manipulation and hygiene: with set-up;sit to/from stand Pt/caregiver will Perform Home Exercise Program: Increased ROM;Increased strength;Right Upper extremity;With written HEP provided;Independently  OT Frequency: Min 2X/week   Barriers to D/C:    none noted       Co-evaluation              AM-PAC OT "6 Clicks" Daily Activity     Outcome Measure Help from another person eating meals?: A Little Help from another person taking care of personal grooming?: A Little Help from another person toileting, which includes using toliet, bedpan, or urinal?: A Little Help from another person bathing (including washing, rinsing, drying)?: A Lot Help from another person to put on and taking off regular upper body clothing?: A Little Help from another person to put on and taking off regular lower body clothing?: A Lot 6 Click Score: 16   End of Session Nurse Communication: Other (comment) (no chair alarms)  Activity Tolerance: Patient  tolerated treatment well Patient left: in chair;with call bell/phone within reach  OT Visit Diagnosis: Unsteadiness on feet (R26.81);Other abnormalities of gait and mobility (R26.89);Muscle weakness (generalized) (M62.81);Hemiplegia and hemiparesis Hemiplegia - Right/Left: Right Hemiplegia - dominant/non-dominant: Dominant                Time: 4098-1191 OT Time Calculation (min): 18 min Charges:  OT General Charges $OT Visit: 1 Visit OT Evaluation $OT Eval Moderate Complexity: 1 Mod  03/01/2021  Rich, OTR/L  Acute Rehabilitation Services  Office:  [phone]   Suzanna Obey 03/01/2021, 9:27 AM

## 2021-03-01 NOTE — Progress Notes (Addendum)
STROKE TEAM PROGRESS NOTE   INTERVAL HISTORY No family is at the bedside.     Vitals:   02/28/21 2130 02/28/21 2312 03/01/21 0312 03/01/21 0749  BP: (!) 189/95 (!) 175/101 (!) 159/94 (!) 174/93  Pulse: 65 81 62 60  Resp: 20 17 17 18   Temp:  98.1 F (36.7 C) 98.2 F (36.8 C) 98.2 F (36.8 C)  TempSrc:  Oral Oral Oral  SpO2: 100% 94% 94% 98%   CBC:  Recent Labs  Lab 02/27/21 1810 03/01/21 0252  WBC 5.6 4.1  NEUTROABS 2.8  --   HGB 15.4 14.0  HCT 47.2 41.0  MCV 92.9 91.7  PLT 281 222   Basic Metabolic Panel:  Recent Labs  Lab 02/27/21 1810 03/01/21 0252  NA 133* 135  K 3.8 3.7  CL 102 105  CO2 26 25  GLUCOSE 99 104*  BUN 15 8  CREATININE 1.27* 1.00  CALCIUM 11.5* 10.5*    Lipid Panel:  Recent Labs  Lab 03/01/21 0252  CHOL 143  TRIG 46  HDL 32*  CHOLHDL 4.5  VLDL 9  LDLCALC 102*    HgbA1c:  Recent Labs  Lab 02/28/21 1852  HGBA1C 5.9*   Urine Drug Screen:  Recent Labs  Lab 02/27/21 1810  LABOPIA NONE DETECTED  COCAINSCRNUR NONE DETECTED  LABBENZ NONE DETECTED  AMPHETMU NONE DETECTED  THCU NONE DETECTED  LABBARB NONE DETECTED    Alcohol Level  Recent Labs  Lab 02/27/21 1810  ETH <10    IMAGING past 24 hours CT ANGIO HEAD NECK W WO CM  Result Date: 02/28/2021 CLINICAL DATA:  Right arm weakness and numbness, left cerebral infarcts on MRI EXAM: CT ANGIOGRAPHY HEAD AND NECK TECHNIQUE: Multidetector CT imaging of the head and neck was performed using the standard protocol during bolus administration of intravenous contrast. Multiplanar CT image reconstructions and MIPs were obtained to evaluate the vascular anatomy. Carotid stenosis measurements (when applicable) are obtained utilizing NASCET criteria, using the distal internal carotid diameter as the denominator. CONTRAST:  50mL OMNIPAQUE IOHEXOL 350 MG/ML SOLN COMPARISON:  CT head 02/27/2021. Correlation made with MRI 02/27/2021 FINDINGS: CT HEAD Brain: Multiple small left cerebral hemisphere  infarcts are better seen on the prior MRI. There is no acute intracranial hemorrhage. No new loss of gray-white differentiation. Stable findings of probable chronic microvascular ischemic changes. No extra-axial fluid collection. Vascular: No new findings. Skull: Calvarium is unremarkable. Sinuses/Orbits: No acute finding. Other: None. Review of the MIP images confirms the above findings CTA NECK Aortic arch: Great vessel origins are patent. Right carotid system: Patent.  No stenosis at the ICA origins. Left carotid system: Patent.  No stenosis at the ICA origin. Vertebral arteries: Patent and codominant.  No stenosis. Skeleton: Mild degenerative changes of the cervical spine. Other neck: As seen on recent chest CT, there is a 3-4 cm mass abutting or arising from the left thyroid lobe. Upper chest: Visualized lung apices are clear. Review of the MIP images confirms the above findings CTA HEAD Anterior circulation: Intracranial internal carotid arteries are patent with mild calcified plaque. Anterior cerebral arteries are patent. Right A1 ACA is hypoplastic. Right middle cerebral artery is patent. There is severe stenosis of the proximal to mid left M1 MCA. Flow within the distal left MCA branches appears relatively decreased compared to the right. Posterior circulation: Intracranial vertebral arteries are patent. Basilar artery is patent. Major cerebellar artery origins patent. Posterior cerebral arteries are patent. Venous sinuses: Patent as allowed by contrast bolus  timing. Review of the MIP images confirms the above findings IMPRESSION: No acute intracranial hemorrhage. Small infarcts better seen on prior MRI. No hemodynamically significant stenosis in the neck. Severe left M1 MCA stenosis with reduced flow in the distal left MCA branches compared to the right. 3-4 cm mass abutting or arising from the left thyroid lobe. Ultrasound is recommended for further evaluation. Electronically Signed   By: Guadlupe SpanishPraneil  Patel  M.D.   On: 02/28/2021 11:39    PHYSICAL EXAM  Physical Exam  Constitutional: Appears well-developed and well-nourished.  Psych: Affect appropriate to situation. Shy, reluctant at times.  Eyes: No scleral injection. Subjective black dots with vision, worse in OD.  HENT: No OP obstrucion Head: Normocephalic.  Cardiovascular: Normal rate and regular rhythm on tele.  Respiratory: Effort normal and breath sounds normal to anterior ascultation GI: Soft.  No distension. There is no tenderness.  Skin: WDI  Neuro: Mental Status: Patient is awake, alert, oriented to person, place, month, year, and situation. Patient is able to give a clear and coherent history. No signs of neglect. Speech/Language:  Speech is clear, fluent without dysarthria or aphasia. Repetition, naming, and comprehension intact.  Cranial Nerves: II: Visual Fields are full. Pupils are 2mm equal, round, and reactive to light.  III,IV, VI: EOMI without ptosis or diploplia.  V: Facial sensation is symmetric to light touch in V1, V2, and V3 VII: Droop slight right. Puffing cheeks is less on the right and raising eyebrows are symmetrical.  VIII: hearing is intact to voice. X: Uvula elevates symmetrically. XI: Shoulder shrug is symmetric. XII: tongue is midline without atrophy or fasciculations.  Motor: RUE:  grips  3+/5     biceps 3+/5     triceps  3+/5 LUE:  grips   5/5    biceps  5/5     triceps  5/5 RLE: knee  4+/5     thigh   4/5     plantar flexion   4-/5     dorsiflexion  4-/5 LLE: knee  5/5     thigh   5/5      plantar flexion   5/5     dorsiflexion  5/5 Tone is somewhat flaccid on RUE. Bulk is normal.   Sensory: Sensation is decreased to light touch to right face, RUE, RLE.  Deep Tendon Reflexes: RUE:  Brachioradialis 1+     RLE: patella 1+ LUE: brachioradialis  2+    LLE: patella 2+ Plantars: Toes are downgoing bilaterally.  Cerebellar: HKS and FNF are intact on the left. Unable to perform on RUE, RLE due  to weakness.    ASSESSMENT/PLAN [name] is a 50 y.o. male with history of HTN and CdHF, and stroke (listed on chart but patient denies) who presented with a 1-2 day history of right sided weakness and numbness. He had some numbness on and off on the right side on Wednesday, including his face, but did not seek care. States he went to bed on Wednesday night and when he woke up around 0700 hrs thursday he was weak on the right side. He stated he had difficulty holding items, like a cup, in his left hand, and difficulty walking due to RLE weakness. He did not fall. Admits to "black dots" in his vision, unrelieved by covering either eye, and is worse in his OD. Admitting NIH8. No tPA as he was outside of window.  MRI brain done showed multiple scattered nonhemorrhagic infarcts involving the left cerebral hemisphere,  with involvement of the left frontal, parietal, temporal, and occipital lobes. For reference purposes, the largest distinct area of infarction seen at the posterior left frontal corona radiata and measures approximately 1 cm.  CTA head and neck done shows no hemodynamically significant stenosis in the neck but does show severe left M1 MCA stenosis with reduced flow in the distal left MCA branches.     He states he has not been to a doctor in 4-5 year, nor has he taken any medications, including antihypertensives. States he lost his job and has not had insurance and could not afford his medications. He had a "thick tongue" and was not able to talk normally. Denies aphasia. No bowel or bladder incontinence.     Stroke left cerebral hemisphere  Code Stroke  CT head No acute abnormality.  ASPECTS 10.  CTA head & neck 02/28/21  No acute intracranial hemorrhage. Small infarcts better seen on prior MRI. No hemodynamically significant stenosis in the neck. Severe left M1 MCA stenosis with reduced flow in the distal left MCA branches compared to the right. 3-4 cm mass abutting or  arising from the left thyroid lobe. Ultrasound is recommended for further evaluation.   MRI   02/27/21 1. Multiple scattered cortical and subcortical acute ischemic nonhemorrhagic left cerebral infarcts as above. No associated hemorrhage or mass effect. 2. Underlying moderate chronic microvascular ischemic disease. 3. Multiple chronic micro hemorrhages clustered about the cerebellum and thalami, suggesting chronic poorly controlled hypertension.   2D Echo  pending  LDL 102  HgbA1c 5.9  VTE prophylaxis - lovenox     Diet   Diet Heart Room service appropriate? Yes; Fluid consistency: Thin     No antithrombotic prior to admission, now on aspirin 81 mg daily and clopidogrel 75 mg daily for 90 days.  Therapy recommendations:  pending  Disposition:  pending  Hypertension  Home meds:   none . Permissive hypertension (OK if < 220/120) but gradually normalize in 5-7 days . Long-term BP goal normotensive  Hyperlipidemia  Home meds:  None ,  lipitor 80mg  started in hospital  LDL 102, goal < 70  Continue statin at discharge   Diabetes type II  Controlled  Home meds:   none  HgbA1c 5.9, goal < 7.0  CBGs  No results for input(s): GLUCAP in the last 72 hours.   SSI  Other Stroke Risk Factors  * 5    Cigarette smoker advised to stop smoking   Other Active Problems     Hospital day # 1   ATTENDING NOTE: I reviewed above note and agree with the assessment and plan. Pt was seen and examined.   50 year old male with history of hypertension, CHF and smoker admitted for right-sided weakness and numbness.  CT head no acute abnormality.  MRI showed multiple scattered cortical and subcortical infarct at left MCA, MCA/PCA, and MCA/ACA territory.  CTA head and neck showed severe left M1 MCA stenosis with reduced flow in the distal left MCA branches.  EF 55 to 60%. LDL 102 and A1c 5.9.  On exam, patient awake, alert, eyes open, orientated to age, place, time. No aphasia,  fluent language, following all simple commands. Able to name and repeat and read. No gaze palsy, tracking bilaterally, visual field full, PERRL. Mild right facial droop. Tongue midline. LUE and LLE 5/5, RUE proxima and distal, RLE proximal 3+/5, distal 4/5 . Sensation mildly decreased on the right, left FTN intact, gait not tested.    Etiology for patient stroke  likely due to left M1 high-grade stenosis.  Risk factor including longstanding uncontrolled hypertension and smoking.  Currently on aspirin 325 and Plavix 75 DAPT for 3 months and then aspirin alone.  On Lipitor 80 mg continue on discharge.  BP goal 130-150 given left M1 high-grade stenosis.  Educated on smoking cessation and stroke risk factor modification.  PT/OT recommend CIR.  Neurology will sign off. Please call with questions. Pt will follow up with stroke clinic NP at [facility] in about 4 weeks. Thanks for the consult.   Marvel Plan, MD PhD Stroke Neurology 03/01/2021 1:43 PM     To contact Stroke Continuity provider, please refer to [email or web address]. After hours, contact General Neurology

## 2021-03-02 LAB — BASIC METABOLIC PANEL
Anion gap: 3 — ABNORMAL LOW (ref 5–15)
BUN: 6 mg/dL (ref 6–20)
CO2: 26 mmol/L (ref 22–32)
Calcium: 10.7 mg/dL — ABNORMAL HIGH (ref 8.9–10.3)
Chloride: 105 mmol/L (ref 98–111)
Creatinine, Ser: 0.91 mg/dL (ref 0.61–1.24)
GFR, Estimated: >60 mL/min (ref >60)
Glucose, Bld: 104 mg/dL — ABNORMAL HIGH (ref 70–99)
Potassium: 4 mmol/L (ref 3.5–5.1)
Sodium: 134 mmol/L — ABNORMAL LOW (ref 135–145)

## 2021-03-02 LAB — GC/CHLAMYDIA PROBE AMP (~~LOC~~) NOT AT ARMC
Chlamydia: NEGATIVE
Comment: NEGATIVE
Comment: NORMAL
Neisseria Gonorrhea: NEGATIVE

## 2021-03-02 LAB — CBC
HCT: 43.9 % (ref 39.0–52.0)
Hemoglobin: 14.9 g/dL (ref 13.0–17.0)
MCH: 31 pg (ref 26.0–34.0)
MCHC: 33.9 g/dL (ref 30.0–36.0)
MCV: 91.5 fL (ref 80.0–100.0)
Platelets: 232 10*3/uL (ref 150–400)
RBC: 4.8 MIL/uL (ref 4.22–5.81)
RDW: 13.6 % (ref 11.5–15.5)
WBC: 4.4 10*3/uL (ref 4.0–10.5)
nRBC: 0 % (ref 0.0–0.2)

## 2021-03-02 NOTE — Progress Notes (Addendum)
Subjective:  O/N Events: None  Charles Callahan was seen at bedside this AM. He states that he is doing well. He feels like he is getting a little bit of his strength back. He would like to continue with therapy.    Objective:  Vital signs in last 24 hours: Vitals:   03/01/21 1557 03/01/21 2033 03/01/21 2345 03/02/21 0434  BP: (!) 161/110 (!) 177/100 (!) 166/94 (!) 169/99  Pulse: 73 73 69 (!) 58  Resp: 19 19 18 19   Temp: 98.8 F (37.1 C) 98.3 F (36.8 C) 98.2 F (36.8 C) 98.2 F (36.8 C)  TempSrc: Oral Oral Oral Oral  SpO2: 100% 96% 96% 97%   Physical Exam Constitutional:      Appearance: He is not ill-appearing.  Cardiovascular:     Rate and Rhythm: Normal rate and regular rhythm.     Pulses: Normal pulses.     Heart sounds: Normal heart sounds. No murmur heard. No friction rub. No gallop.   Pulmonary:     Effort: Pulmonary effort is normal.     Breath sounds: Normal breath sounds. No wheezing, rhonchi or rales.  Neurological:     Mental Status: He is alert.     CBC Latest Ref Rng & Units 03/02/2021 03/01/2021 02/27/2021  WBC 4.0 - 10.5 K/uL 4.4 4.1 5.6  Hemoglobin 13.0 - 17.0 g/dL 04/29/2021 22.0 25.4  Hematocrit 39.0 - 52.0 % 43.9 41.0 47.2  Platelets 150 - 400 K/uL 232 222 281    BMP Latest Ref Rng & Units 03/02/2021 03/01/2021 02/27/2021  Glucose 70 - 99 mg/dL 04/29/2021) 623(J) 99  BUN 6 - 20 mg/dL 6 8 15   Creatinine 0.61 - 1.24 mg/dL 628(B 1.51)  Sodium 135 - 145 mmol/L 134(L) 135 133(L)  Potassium 3.5 - 5.1 mmol/L 4.0 3.7 3.8  Chloride 98 - 111 mmol/L 105 105 102  CO2 22 - 32 mmol/L 26 25 26   Calcium 8.9 - 10.3 mg/dL 10.7(H) 10.5(H) 11.5(H)     Assessment/Plan:  Principal Problem:   Acute CVA (cerebrovascular accident) [facility]) Active Problems:   Hypertension   Substance use disorder  Charles Callahan is a 50 year old gentleman with medical history significant for poorly controlled hypertension, GERD, cocaine use disorder (last use 1 month ago), tobacco disorder here  for management of acute CVA  Acute Scattered Cortical and Subcortical Nonhemorrhagic CVA CT angio showing severe L M1 MCA stenosis. Started on Plavix and ASA, PT/ OT recommending CIR placement. SLP will initiate speech-language-cognitive eval when able. Pressures are elevated, but will continue permissive HTN. Lipid pane showed mildly elevated LDL(102), and mildly decreased HDL (32). Echo back with LVEF 55-60%. Moderate L ventricular hypertrophy, Grad II Diastolic dysfunction.  - Appreciate Neurology's recommendations - Continue ASA 325mg  plus Plavix 75 mg daily for 3 weeks then stand alone ASA.  - Lipitor 80mg  daily  Thyroid Mass:  Incidental finding on CT angio. Follow up with U/S. U/S Showing a 3.7 cm (Max), 3.5x3.4 hypoechoic, lobulated nodule.  - Consider percutaneous sampling.   #Hx of Uncontrolled Hypertension - IV hydralazine PRN   #Mild hypercalcemia Ca 10.7. corrected for albumin on 10/14 was 11.3 - Ordering PTH and vitamin D  #Tobacco use disorder Current half pack day smoker.  - Nicotine 14 mg patch  #High suspicion of OSA Hx of snoring and apnea.  - Outpatient sleep study   Prior to Admission Living Arrangement: Home Anticipated Discharge Location:Pending Barriers to Discharge: Continued medical management.   Vilma Prader, MD 03/02/2021, 6:30  AM Pager: [phone] After 5pm on weekdays and 1pm on weekends: On Call pager [phone]

## 2021-03-02 NOTE — Progress Notes (Signed)
Physical Therapy Treatment Patient Details Name: Charles Callahan MRN: [id number] DOB: 1971-08-27 Today's Date: 03/02/2021    History of Present Illness Pt is a 50 y/o male admitted 5/12 secondary to worsening  R UE and RLE weakness, numbness. Imaging revealed multiple L hemisphere infarcts. PMH includes HTN, tobacco use, and substance abuse.    PT Comments    Today's skilled session focused on strengthening and right LE weight bearing in stance with no issued noted or reported by patient. Continue to recommend CIR. Acute PT to continue during pt's hospital stay.    Follow Up Recommendations  Supervision for mobility/OOB;CIR     Equipment Recommendations  Other (comment) (defer to next venue of care)    Precautions / Restrictions Precautions Precautions: Fall Restrictions Weight Bearing Restrictions: No    Mobility  Bed Mobility               General bed mobility comments: OOB in chair before and after session    Transfers Overall transfer level: Needs assistance Equipment used: None Transfers: Sit to/from Stand Sit to Stand: Min assist;Min guard         General transfer comment: cues to use both arms with standing up and sitting down, along with cues for equal LE weight bearign with transfers   Modified Rankin (Stroke Patients Only) Modified Rankin (Stroke Patients Only) Pre-Morbid Rankin Score: No symptoms Modified Rankin: Moderately severe disability     Balance Overall balance assessment: Needs assistance Sitting-balance support: No upper extremity supported;Feet supported Sitting balance-Leahy Scale: Good Sitting balance - Comments: no balance issues with unsupported sitting at edge of chair   Standing balance support: Single extremity supported;During functional activity;Bilateral upper extremity supported Standing balance-Leahy Scale: Poor Standing balance comment: in standing with HHA bil sides- focused on decreasing upper trunk rotation as pt has  left shouler forward, then focused on getting pt to bring head/body into midline with PTA as visual vertical guide. then progressed to working on lateral weight shifting toward both directions with continued cues to maintain uppert trunk positioning. Then progressed to staying on right side while stepping left foot forward/backwards, then back to midline. Performed this for 6 reps with visable muscle tremors in right LE with last 2 reps.               Cognition Arousal/Alertness: Awake/alert Behavior During Therapy: WFL for tasks assessed/performed Overall Cognitive Status: Within Functional Limits for tasks assessed              Exercises General Exercises - Lower Extremity Long Arc Quad: AAROM;Right;10 reps;Seated Toe Raises: AAROM;Strengthening;Both;10 reps;Seated;Limitations Toe Raises Limitations: assitance needed with right foot Heel Raises: AROM;Strengthening;Both;10 reps;Seated     Pertinent Vitals/Pain Pain Assessment: No/denies pain    Home Living     Available Help at Discharge: Family;Available PRN/intermittently Type of Home: House               PT Goals (current goals can now be found in the care plan section) Acute Rehab PT Goals Patient Stated Goal: to get my leg and arm moving PT Goal Formulation: With patient Time For Goal Achievement: 03/14/21 Potential to Achieve Goals: Good Progress towards PT goals: Progressing toward goals    Frequency    Min 4X/week      PT Plan Current plan remains appropriate    AM-PAC PT "6 Clicks" Mobility   Outcome Measure  Help needed turning from your back to your side while in a flat bed without using bedrails?: A  Little Help needed moving from lying on your back to sitting on the side of a flat bed without using bedrails?: A Little Help needed moving to and from a bed to a chair (including a wheelchair)?: A Little Help needed standing up from a chair using your arms (e.g., wheelchair or bedside chair)?:  A Little Help needed to walk in hospital room?: A Lot Help needed climbing 3-5 steps with a railing? : Total 6 Click Score: 15    End of Session Equipment Utilized During Treatment: Gait belt Activity Tolerance: Patient tolerated treatment well Patient left: in chair;with call bell/phone within reach Nurse Communication: Mobility status PT Visit Diagnosis: Hemiplegia and hemiparesis;Unsteadiness on feet (R26.81);Difficulty in walking, not elsewhere classified (R26.2) Hemiplegia - Right/Left: Right Hemiplegia - dominant/non-dominant: Dominant Hemiplegia - caused by: Cerebral infarction     Time: 1100-1115 PT Time Calculation (min) (ACUTE ONLY): 15 min  Charges:  $Neuromuscular Re-education: 8-22 mins                    Sallyanne Kuster, PTA, CLT Acute Rehab Services Office- [phone] 03/02/21, 11:42 AM  Sallyanne Kuster 03/02/2021, 11:41 AM

## 2021-03-02 NOTE — Evaluation (Signed)
Speech Language Pathology Evaluation Patient Details Name: Charles Callahan MRN: [id number] DOB: 1970/12/05 Today's Date: 03/02/2021 Time: 3335-4562 SLP Time Calculation (min) (ACUTE ONLY): 25 min  Problem List:  Patient Active Problem List   Diagnosis Date Noted  . Acute CVA (cerebrovascular accident) (HCC) 02/28/2021  . Substance use disorder 02/28/2021  . OBESITY 05/13/2009  . Hypertension 05/13/2009  . STROKE 05/13/2009  . BACK PAIN 05/13/2009  . SHORTNESS OF BREATH 05/13/2009  . CHEST PAIN 05/13/2009   Past Medical History:  Past Medical History:  Diagnosis Date  . CHF (congestive heart failure) (HCC)   . Hypertension    Past Surgical History:  Past Surgical History:  Procedure Laterality Date  . EYE SURGERY     HPI:  Pt is a 50 y/o male admitted 5/12 secondary to worsening  R UE and RLE weakness, numbness. Imaging revealed multiple L hemisphere infarcts. PMH includes HTN, tobacco use, and substance abuse.   Assessment / Plan / Recommendation Clinical Impression  Patient presents with mild impairments in the areas of speech intelligibility and word finding. Mild dysarthria noted due to right sided oral weakness s/p CVA. Also with possible high level word finding deficits during conversation although patient notes this is baseline. Will benefit from SLP f/u to maximize verbal communication.    SLP Assessment  SLP Recommendation/Assessment: Patient needs continued Speech Lanaguage Pathology Services SLP Visit Diagnosis: Dysarthria and anarthria (R47.1)    Follow Up Recommendations  Inpatient Rehab    Frequency and Duration min 2x/week  2 weeks      SLP Evaluation Cognition  Overall Cognitive Status: Within Functional Limits for tasks assessed       Comprehension  Reading Comprehension Reading Status: Within funtional limits    Expression Expression Primary Mode of Expression: Verbal Verbal Expression Overall Verbal Expression:  (possible mild word finding  during high level tasks) Written Expression Dominant Hand: Right Written Expression: Not tested   Oral / Motor  Oral Motor/Sensory Function Overall Oral Motor/Sensory Function: Mild impairment Facial ROM: Reduced right;Suspected CN VII (facial) dysfunction Facial Symmetry: Abnormal symmetry right;Suspected CN VII (facial) dysfunction Facial Strength: Reduced right;Suspected CN VII (facial) dysfunction Facial Sensation: Within Functional Limits Lingual ROM: Reduced right;Suspected CN XII (hypoglossal) dysfunction Lingual Symmetry: Within Functional Limits Lingual Strength: Reduced;Suspected CN XII (hypoglossal) dysfunction Lingual Sensation: Within Functional Limits Velum: Within Functional Limits Mandible: Within Functional Limits Motor Speech Overall Motor Speech: Impaired Respiration: Within functional limits Phonation: Normal;Low vocal intensity Resonance: Within functional limits Articulation: Impaired Level of Impairment: Sentence Intelligibility: Intelligibility reduced Word: 75-100% accurate Phrase: 75-100% accurate Sentence: 75-100% accurate Conversation: 75-100% accurate Motor Planning: Witnin functional limits Effective Techniques: Increased vocal intensity;Over-articulate   GO                   [name] MA, CCC-SLP   [name] 03/02/2021, 10:37 AM

## 2021-03-03 DIAGNOSIS — I639 Cerebral infarction, unspecified: Principal | ICD-10-CM

## 2021-03-03 LAB — COMPREHENSIVE METABOLIC PANEL
ALT: 19 U/L (ref 0–44)
AST: 14 U/L — ABNORMAL LOW (ref 15–41)
Albumin: 3.3 g/dL — ABNORMAL LOW (ref 3.5–5.0)
Alkaline Phosphatase: 76 U/L (ref 38–126)
Anion gap: 4 — ABNORMAL LOW (ref 5–15)
BUN: 10 mg/dL (ref 6–20)
CO2: 28 mmol/L (ref 22–32)
Calcium: 11.3 mg/dL — ABNORMAL HIGH (ref 8.9–10.3)
Chloride: 102 mmol/L (ref 98–111)
Creatinine, Ser: 0.99 mg/dL (ref 0.61–1.24)
GFR, Estimated: >60 mL/min (ref >60)
Glucose, Bld: 107 mg/dL — ABNORMAL HIGH (ref 70–99)
Potassium: 4.3 mmol/L (ref 3.5–5.1)
Sodium: 134 mmol/L — ABNORMAL LOW (ref 135–145)
Total Bilirubin: 0.7 mg/dL (ref 0.3–1.2)
Total Protein: 6.3 g/dL — ABNORMAL LOW (ref 6.5–8.1)

## 2021-03-03 LAB — VITAMIN D 25 HYDROXY (VIT D DEFICIENCY, FRACTURES): Vit D, 25-Hydroxy: 15.09 ng/mL — ABNORMAL LOW (ref 30–100)

## 2021-03-03 MED ORDER — LIDOCAINE 5 % EX PTCH
1.0000 | MEDICATED_PATCH | CUTANEOUS | Status: DC
  Administered 2021-03-03 – 2021-03-05 (×3): 1 via TRANSDERMAL
  Filled 2021-03-03 (×3): qty 1

## 2021-03-03 MED ORDER — AMLODIPINE BESYLATE 5 MG PO TABS
5.0000 mg | ORAL_TABLET | Freq: Every day | ORAL | Status: DC
  Administered 2021-03-03: 5 mg via ORAL
  Filled 2021-03-03: qty 1

## 2021-03-03 NOTE — Progress Notes (Signed)
Physical Therapy Treatment Patient Details Name: Charles Callahan MRN: [id number] DOB: 05/17/1971 Today's Date: 03/03/2021    History of Present Illness Pt is a 50 y/o male admitted 5/12 secondary to worsening  R UE and RLE weakness, numbness. Imaging revealed multiple L hemisphere infarcts. PMH includes HTN, tobacco use, and substance abuse.    PT Comments    Today's skilled session continued to focus on mobility progression. Pt is making steady progress. Pt also continues to have elevated BP's (MD and RN aware). 188/98 at start of session, 195/106 after gait, both in sitting with left UE used.  Remains appropriate for CIR to maximize functional gains before return to home. Acute PT to continued.    Follow Up Recommendations  Supervision for mobility/OOB;CIR     Equipment Recommendations  Other (comment) (defer to next venue of care)    Precautions / Restrictions Precautions Precautions: Fall Restrictions Weight Bearing Restrictions: No    Mobility  Bed Mobility               General bed mobility comments: OOB in chair before and after session    Transfers Overall transfer level: Needs assistance Equipment used: Rolling walker (2 wheeled) Transfers: Sit to/from Stand Sit to Stand: Min guard         General transfer comment: cues to use both arms with standing up and sitting down, along with cues for equal LE weight bearing with cues for proper foot placement as well with transfers  Ambulation/Gait Ambulation/Gait assistance: Min assist Gait Distance (Feet): 25 Feet   Gait Pattern/deviations: Step-to pattern;Step-through pattern;Decreased stance time - right;Decreased stride length;Decreased dorsiflexion - right;Decreased weight shift to right;Decreased step length - left;Narrow base of support Gait velocity: slow   General Gait Details: with use of RW- cues/facilitation for upright posture with decreased trunk rotation, anterior translation of hip over right LE in  stance and cues/assist for hip/knee flexion with swing phase of gait. pt continues to have decreased hip/knee flexion and decreased DF with foot sliding on floor at times vs picking foot up.    Modified Rankin (Stroke Patients Only) Modified Rankin (Stroke Patients Only) Pre-Morbid Rankin Score: No symptoms Modified Rankin: Moderately severe disability     Cognition Arousal/Alertness: Awake/alert Behavior During Therapy: WFL for tasks assessed/performed Overall Cognitive Status: Within Functional Limits for tasks assessed                Exercises General Exercises - Lower Extremity Long Arc Quad: AAROM;Right;10 reps;Seated Hip Flexion/Marching: AAROM;Right;10 reps;Seated Toe Raises: AAROM;Strengthening;Both;10 reps;Seated;Limitations Toe Raises Limitations: assistance needed with right foot     Pertinent Vitals/Pain Pain Assessment: No/denies pain     PT Goals (current goals can now be found in the care plan section) Acute Rehab PT Goals Patient Stated Goal: to get my leg and arm moving PT Goal Formulation: With patient Time For Goal Achievement: 03/14/21 Potential to Achieve Goals: Good Progress towards PT goals: Progressing toward goals    Frequency    Min 4X/week      PT Plan Current plan remains appropriate    AM-PAC PT "6 Clicks" Mobility   Outcome Measure  Help needed turning from your back to your side while in a flat bed without using bedrails?: A Little Help needed moving from lying on your back to sitting on the side of a flat bed without using bedrails?: A Little Help needed moving to and from a bed to a chair (including a wheelchair)?: A Little Help needed standing up from  a chair using your arms (e.g., wheelchair or bedside chair)?: A Little Help needed to walk in hospital room?: A Lot Help needed climbing 3-5 steps with a railing? : Total 6 Click Score: 15    End of Session Equipment Utilized During Treatment: Gait belt Activity  Tolerance: Patient tolerated treatment well Patient left: in chair;with call bell/phone within reach Nurse Communication: Mobility status PT Visit Diagnosis: Hemiplegia and hemiparesis;Unsteadiness on feet (R26.81);Difficulty in walking, not elsewhere classified (R26.2) Hemiplegia - Right/Left: Right Hemiplegia - dominant/non-dominant: Dominant Hemiplegia - caused by: Cerebral infarction     Time: 2010-0712 PT Time Calculation (min) (ACUTE ONLY): 18 min  Charges:  $Gait Training: 8-22 mins                    Sallyanne Kuster, PTA, CLT Acute [facility] [employer] [phone] 03/03/21, 9:50 AM  Sallyanne Kuster 03/03/2021, 9:50 AM

## 2021-03-03 NOTE — Consult Note (Signed)
Physical Medicine and Rehabilitation Consult Reason for Consult: Right side weakness Referring Physician: Dr. Charissa Bash   HPI: Charles Callahan is a 50 y.o. right-handed male with history of diastolic congestive heart failure as well as hypertension and tobacco use.  Per chart review patient lives with parent.  Independent prior to admission.  1 level home with level entry.  Presented 02/27/2021 with acute onset of right side weakness.  Cranial CT scan no acute findings.  Area of low density in the cerebral hemisphere white matter consistent with small vessel ischemic change.  Patient did not receive tPA.  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  No associated hemorrhage or mass-effect.  CT angiogram of the head/neck showed no hemodynamically significant stenosis of the neck however there was a 3-4 cm mass abutting or arising from the left thyroid lobe.  Ultrasound of the thyroid showed a solitary 3.7 m right side thyroid nodule recommending percutaneous sampling.  Echocardiogram with ejection fraction of 55 to 60% no wall motion abnormalities grade 2 diastolic dysfunction.  Admission chemistries unremarkable sodium 133, urine drug screen negative, alcohol negative, urinalysis negative nitrite.  Currently on aspirin 325 mg daily and Plavix 75 mg daily for CVA prophylaxis x3 months then aspirin alone.  Subcutaneous Lovenox for DVT prophylaxis.  Tolerating a regular diet.  Therapy evaluations completed due to patient's right side weakness recommendations of physical medicine rehab consult. He is interested in CIR.    Review of Systems  Constitutional: Negative for chills and fever.  HENT: Negative for hearing loss.   Eyes: Negative for blurred vision and double vision.  Respiratory: Negative for cough and shortness of breath.   Cardiovascular: Negative for chest pain, palpitations and leg swelling.  Gastrointestinal: Positive  for constipation. Negative for heartburn, nausea and vomiting.  Genitourinary: Negative for dysuria, flank pain and hematuria.  Musculoskeletal: Positive for myalgias.  Skin: Negative for rash.  Neurological: Positive for weakness.       Occasional headaches  All other systems reviewed and are negative.  Past Medical History:  Diagnosis Date  . CHF (congestive heart failure) (HCC)   . Hypertension    Past Surgical History:  Procedure Laterality Date  . EYE SURGERY     Family History  Problem Relation Age of Onset  . Diabetes Mother   . Hypertension Mother    Social History:  reports that he has been smoking cigarettes. He has been smoking about 1.00 pack per day. He has never used smokeless tobacco. He reports current alcohol use. He reports current drug use. Drug: Cocaine. Allergies:  Allergies  Allergen Reactions  . Morphine And Related Nausea And Vomiting   Medications Prior to Admission  Medication Sig Dispense Refill  . albuterol (PROVENTIL HFA;VENTOLIN HFA) 108 (90 BASE) MCG/ACT inhaler Inhale 1-2 puffs into the lungs every 6 (six) hours as needed for wheezing. (Patient not taking: Reported on 02/28/2021) 1 Inhaler 0  . cetirizine (ZYRTEC) 10 MG tablet Take 1 tablet (10 mg total) by mouth daily. (Patient not taking: Reported on 02/28/2021) 30 tablet 11  . dicyclomine (BENTYL) 20 MG tablet Take 1 tablet (20 mg total) by mouth 2 (two) times daily. (Patient not taking: No sig reported) 20 tablet 0  . HYDROcodone-homatropine (HYCODAN) 5-1.5 MG/5ML syrup Take 5 mLs by mouth every 6 (six) hours as needed for cough. (Patient not taking: Reported on 02/28/2021) 75 mL 0  . ibuprofen (ADVIL,MOTRIN) 600 MG tablet Take 1  tablet (600 mg total) by mouth every 8 (eight) hours as needed for pain or fever. (Patient not taking: Reported on 02/28/2021) 20 tablet 0  . lisinopril (ZESTRIL) 20 MG tablet Take 1 tablet (20 mg total) by mouth daily. (Patient not taking: No sig reported) 30 tablet 1  .  omeprazole (PRILOSEC) 20 MG capsule Take 1 capsule (20 mg total) by mouth daily. (Patient not taking: Reported on 02/28/2021) 30 capsule 3  . ondansetron (ZOFRAN) 4 MG tablet Take 1 tablet (4 mg total) by mouth every 6 (six) hours. (Patient not taking: Reported on 02/28/2021) 12 tablet 0  . ondansetron (ZOFRAN) 4 MG tablet Take 1 tablet (4 mg total) by mouth every 6 (six) hours. (Patient not taking: No sig reported) 12 tablet 0  . permethrin (ELIMITE) 5 % cream Apply to affected area once (Patient not taking: No sig reported) 60 g 0  . sucralfate (CARAFATE) 1 GM/10ML suspension Take 10 mLs (1 g total) by mouth 4 (four) times daily -  with meals and at bedtime. (Patient not taking: No sig reported) 420 mL 0  . tobramycin (TOBREX) 0.3 % ophthalmic solution Place 1 drop into the right eye every 6 (six) hours. (Patient not taking: No sig reported) 5 mL 0    Home: Home Living Family/patient expects to be discharged to:: Private residence Living Arrangements: Parent Available Help at Discharge: Family,Available PRN/intermittently Type of Home: House Home Access: Level entry Home Layout: One level Bathroom Shower/Tub: Engineer, manufacturing systems: Standard Home Equipment: None  Lives With: Alone  Functional History: Prior Function Level of Independence: Independent Functional Status:  Mobility: Bed Mobility Overal bed mobility: Needs Assistance Bed Mobility: Supine to Sit Supine to sit: Min assist Sit to supine: Min guard General bed mobility comments: OOB in chair before and after session Transfers Overall transfer level: Needs assistance Equipment used: Rolling walker (2 wheeled) Transfers: Sit to/from Stand Sit to Stand: Min guard Stand pivot transfers: Min guard General transfer comment: Tactile cues and faiclitating of weight bearing through RUE when pushing up into standing. Min Guard A for safety Ambulation/Gait Ambulation/Gait assistance: Editor, commissioning (Feet): 25  Feet Assistive device: Rolling walker (2 wheeled) Gait Pattern/deviations: Step-to pattern,Step-through pattern,Decreased stance time - right,Decreased stride length,Decreased dorsiflexion - right,Decreased weight shift to right,Decreased step length - left,Narrow base of support General Gait Details: with use of RW- cues/facilitation for upright posture with decreased trunk rotation, anterior translation of hip over right LE in stance and cues/assist for hip/knee flexion with swing phase of gait. pt continues to have decreased hip/knee flexion and decreased DF with foot sliding on floor at times vs picking foot up. Gait velocity: slow Gait velocity interpretation: <1.31 ft/sec, indicative of household ambulator    ADL: ADL Overall ADL's : Needs assistance/impaired Eating/Feeding: Minimal assistance,With adaptive utensils,Sitting Eating/Feeding Details (indicate cue type and reason): Focusing first half of session on eating breakfast. Pt using built up handle to increase success with grasp fork. Pt presenting with increased in hand manipulation. However, requiring cues to problem solve how to sequence in hand manipulation. As sesssion progress, pt becoming more confident with sequencing and required cues to initate in hand manipulation. Min A for support at elbow to optimzie bringing fork to mouth as pt with tednecy to lean forward to fork Upper Body Dressing : Minimal assistance,Sitting Lower Body Dressing: Minimal assistance,Sit to/from stand Functional mobility during ADLs: Minimal assistance General ADL Comments: Pt performing sit<>stand, self feeding, and UE exercises.  Cognition: Cognition Overall  Cognitive Status: Within Functional Limits for tasks assessed Orientation Level: Oriented X4 Cognition Arousal/Alertness: Awake/alert Behavior During Therapy: WFL for tasks assessed/performed Overall Cognitive Status: Within Functional Limits for tasks assessed General Comments: Very  motivated  Blood pressure (!) 152/88, pulse 80, temperature 98.1 F (36.7 C), temperature source Oral, resp. rate 18, SpO2 100 %. Physical Exam Gen: no distress, normal appearing HEENT: oral mucosa pink and moist, NCAT Cardio: Reg rate Chest: normal effort, normal rate of breathing Abd: soft, non-distended Ext: no edema Psych: pleasant, normal affect Skin: intact Neurological:     Comments: Patient is alert NAD/follow commands.  Makes eye contact with examiner.Oriented x3. 2/5 R EE and EF, 3/5 WE and hand grip. 5/5 strength LUE. 3/5 RLE except for 1/5 DF, 5/5 LLE.   Results for orders placed or performed during the hospital encounter of 02/27/21 (from the past 24 hour(s))  VITAMIN D 25 Hydroxy (Vit-D Deficiency, Fractures)     Status: Abnormal   Collection Time: 03/03/21  4:22 AM  Result Value Ref Range   Vit D, 25-Hydroxy 15.09 (L) 30 - 100 ng/mL  Comprehensive metabolic panel     Status: Abnormal   Collection Time: 03/03/21  4:22 AM  Result Value Ref Range   Sodium 134 (L) 135 - 145 mmol/L   Potassium 4.3 3.5 - 5.1 mmol/L   Chloride 102 98 - 111 mmol/L   CO2 28 22 - 32 mmol/L   Glucose, Bld 107 (H) 70 - 99 mg/dL   BUN 10 6 - 20 mg/dL   Creatinine, Ser 6.960.99 0.61 - 1.24 mg/dL   Calcium 29.511.3 (H) 8.9 - 10.3 mg/dL   Total Protein 6.3 (L) 6.5 - 8.1 g/dL   Albumin 3.3 (L) 3.5 - 5.0 g/dL   AST 14 (L) 15 - 41 U/L   ALT 19 0 - 44 U/L   Alkaline Phosphatase 76 38 - 126 U/L   Total Bilirubin 0.7 0.3 - 1.2 mg/dL   GFR, Estimated >28>60 >41>60 mL/min   Anion gap 4 (L) 5 - 15   ECHOCARDIOGRAM COMPLETE BUBBLE STUDY  Result Date: 03/01/2021    ECHOCARDIOGRAM REPORT   Patient Name:   Charles Callahan Date of Exam: 03/01/2021 Medical Rec #:  [id number]    Height:       66.5 in Accession #:    [id number]   Weight:       225.0 lb Date of Birth:  02/25/1971    BSA:          2.114 m Patient Age:    49 years     BP:           187/109 mmHg Patient Gender: M            HR:           62 bpm. [facility] Procedure: 2D Echo, Cardiac Doppler and Color Doppler Indications:    Stroke l63.9  History:        Patient has prior history of Echocardiogram examinations, most                 recent 04/11/2009. CHF; Risk Factors:Hypertension. Substance use                 disorder.  Sonographer:    Celesta GentileBernard White RCS Referring Phys: [phone] WHITNEY PLUNKETT IMPRESSIONS  1. Left ventricular ejection fraction, by estimation, is 55 to 60%. The left ventricle has normal function. The left ventricle has no regional wall  motion abnormalities. There is moderate left ventricular hypertrophy. Left ventricular diastolic parameters are consistent with Grade II diastolic dysfunction (pseudonormalization).  2. Right ventricular systolic function is normal. The right ventricular size is normal. Tricuspid regurgitation signal is inadequate for assessing PA pressure.  3. Left atrial size was moderately dilated.  4. There is a trivial pericardial effusion posterior to the left ventricle.  5. The mitral valve is grossly normal. Mild mitral valve regurgitation.  6. The aortic valve is tricuspid. Aortic valve regurgitation is not visualized.  7. The inferior vena cava is normal in size with greater than 50% respiratory variability, suggesting right atrial pressure of 3 mmHg. FINDINGS  Left Ventricle: Left ventricular ejection fraction, by estimation, is 55 to 60%. The left ventricle has normal function. The left ventricle has no regional wall motion abnormalities. The left ventricular internal cavity size was normal in size. There is  moderate left ventricular hypertrophy. Left ventricular diastolic parameters are consistent with Grade II diastolic dysfunction (pseudonormalization). Right Ventricle: The right ventricular size is normal. No increase in right ventricular wall thickness. Right ventricular systolic function is normal. Tricuspid regurgitation signal is inadequate for assessing PA pressure. Left Atrium: Left atrial size was  moderately dilated. Right Atrium: Right atrial size was normal in size. Pericardium: Trivial pericardial effusion is present. The pericardial effusion is posterior to the left ventricle. Mitral Valve: The mitral valve is grossly normal. Mild mitral valve regurgitation. Tricuspid Valve: The tricuspid valve is grossly normal. Tricuspid valve regurgitation is trivial. Aortic Valve: The aortic valve is tricuspid. There is mild to moderate aortic valve annular calcification. Aortic valve regurgitation is not visualized. Pulmonic Valve: The pulmonic valve was grossly normal. Pulmonic valve regurgitation is trivial. Aorta: The aortic root is normal in size and structure. Venous: The inferior vena cava is normal in size with greater than 50% respiratory variability, suggesting right atrial pressure of 3 mmHg. IAS/Shunts: No atrial level shunt detected by color flow Doppler.  LEFT VENTRICLE PLAX 2D LVIDd:         5.20 cm  Diastology LVIDs:         3.60 cm  LV e' medial:    4.68 cm/s LV PW:         1.20 cm  LV E/e' medial:  15.7 LV IVS:        1.40 cm  LV e' lateral:   5.77 cm/s LVOT diam:     2.10 cm  LV E/e' lateral: 12.8 LV SV:         56 LV SV Index:   27 LVOT Area:     3.46 cm  RIGHT VENTRICLE RV S prime:     10.90 cm/s TAPSE (M-mode): 2.0 cm LEFT ATRIUM             Index       RIGHT ATRIUM           Index LA diam:        4.40 cm 2.08 cm/m  RA Area:     20.50 cm LA Vol (A2C):   77.5 ml 36.66 ml/m RA Volume:   65.10 ml  30.79 ml/m LA Vol (A4C):   93.2 ml 44.08 ml/m LA Biplane Vol: 90.5 ml 42.80 ml/m  AORTIC VALVE LVOT Vmax:   80.90 cm/s LVOT Vmean:  51.400 cm/s LVOT VTI:    0.162 m  AORTA Ao Root diam: 3.10 cm MITRAL VALVE MV Area (PHT): 4.80 cm    SHUNTS MV Decel Time: 158 msec  Systemic VTI:  0.16 m MV E velocity: 73.70 cm/s  Systemic Diam: 2.10 cm MV A velocity: 62.40 cm/s MV E/A ratio:  1.18 Nona Dell MD Electronically signed by Nona Dell MD Signature Date/Time: 03/01/2021/12:47:23 PM    Final      Assessment/Plan: Diagnosis: CVA 1. Does the need for close, 24 hr/day medical supervision in concert with the patient's rehab needs make it unreasonable for this patient to be served in a less intensive setting? Yes 2. Co-Morbidities requiring supervision/potential complications:  1. Right sided weakness 2. Right shoulder pain: lidocaine patch ordered 3. HTN: monitor TID 4. Substance abuse disorder: provide counseling 5. History of chest pain 3. Due to bladder management, bowel management, safety, skin/wound care, disease management, medication administration, pain management and patient education, does the patient require 24 hr/day rehab nursing? Yes 4. Does the patient require coordinated care of a physician, rehab nurse, therapy disciplines of PT, OT to address physical and functional deficits in the context of the above medical diagnosis(es)? Yes Addressing deficits in the following areas: balance, endurance, locomotion, strength, transferring, bowel/bladder control, bathing, dressing, feeding, grooming, toileting and psychosocial support 5. Can the patient actively participate in an intensive therapy program of at least 3 hrs of therapy per day at least 5 days per week? Yes 6. The potential for patient to make measurable gains while on inpatient rehab is excellent 7. Anticipated functional outcomes upon discharge from inpatient rehab are modified independent  with PT, modified independent with OT, independent with SLP. 8. Estimated rehab length of stay to reach the above functional goals is: 5-7 days 9. Anticipated discharge destination: Home 10. Overall Rehab/Functional Prognosis: excellent  RECOMMENDATIONS: This patient's condition is appropriate for continued rehabilitative care in the following setting: CIR Patient has agreed to participate in recommended program. Yes Note that insurance prior authorization may be required for reimbursement for recommended care.  Comment: Thank  you for this consult. Admission coordinator to follow.   I have personally performed a face to face diagnostic evaluation, including, but not limited to relevant history and physical exam findings, of this patient and developed relevant assessment and plan.  Additionally, I have reviewed and concur with the physician assistant's documentation above.  Sula Soda, MD  Mcarthur Rossetti Angiulli, PA-C 03/03/2021

## 2021-03-03 NOTE — Progress Notes (Signed)
Inpatient Rehab Admissions:  Inpatient Rehab Consult received.  I met with patient and daughter, Loma Sousa at the bedside for rehabilitation assessment and to discuss goals and expectations of an inpatient rehab admission.  Both acknowledged understanding of goals and expectations.  Both interested in pt pursuing CIR.  Per pt request, contacted financial counselor, Elie Confer Revels to inform her pt and family would like to talk with her. Will continue to follow.   Signed: Gayland Curry, [place], [place] Admissions Coordinator [phone]

## 2021-03-03 NOTE — Discharge Summary (Incomplete)
Name: Charles Callahan MRN: [id number] DOB: 08/05/1971 50 y.o. PCP: Patient, No Pcp Per (Inactive)  Date of Admission: 02/27/2021  5:58 PM Date of Discharge:  *** Attending Physician: Miguel Aschoff, MD ***  Discharge Diagnosis: 1. 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 Thyroid Nodule 3. Hypertension 4. Hypercalcemia   Discharge Medications: Allergies as of 03/03/2021      Reactions   Morphine And Related Nausea And Vomiting    Med Rec must be completed prior to using this [facility]***       Disposition and follow-up:   CharlesZaelyn E Callahan was discharged from [facility] in {DISCHARGE CONDITION:19696} condition.  At the hospital follow up visit please address:  1.  Follow up: Marland Kitchen Follow up with Stroke clinic at [facility] in 4 weeks.  [facility]. Call to schedule PCP as below. 2.  Labs / imaging needed at time of follow-up: IR percutaneous sampling.   3.  Pending labs/ test needing follow-up: None*** PTH and Calcitriol  Follow-up Appointments:  Follow-up Information    [facility]. Call.   Why: Call to schedule an appoiuntment to establish a primary care provider. You may use thier low cost pharmacy once you schedule an appointment Contact information: [address] [place] [address] [phone]       [facility]. Schedule an appointment as soon as possible for a visit in 4 week(s).   Specialty: Neurology Contact information: [address] [place] [address] [phone]              Hospital Course by problem list: Charles Callahan a 50 year old gentleman with medical history significant for poorly controlled hypertension, GERD, cocaine use disorder(last use 1 month ago), a and tobacco disorderhere for management of acute CVA.  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 Pt was not taking his HTN medication because he couldn't afford taking his medications. BP at admission was high at 188/122 mmHg. Code Stroke  CT head showed No acute abnormality. MRI showed Multiple scattered cortical and subcortical acute ischemic nonhemorrhagic left cerebral infarcts. Underlying moderate chronic microvascular ischemic disease. Multiple chronic micro hemorrhages clustered about the cerebellum and thalami, suggesting chronic poorly controlled hypertension. Etiology likely due to Left M1 high grade stenosis seen on CT angio. Risk factors includes long standing uncontrolled hypertension, smoking and cocaine use. ECHO with LVEF 55-60%. Moderate L ventricular hypertrophy, Grad II Diastolic dysfunction. HBA1c 5.9. LDL 102. Neuro consulted recommended Plavix 75 mg and ASA 325 mg continuing for 3 months and then ASA alone. Also recommended to continue Lipitor 80 mg. OT/PT recommended CIR.  Thyroid Nodule Incidental finding on CT angio. U/S done which showed a 3.7 cm (Max), 3.5 x3.4 hypoechoic, lobulated nodule. Mildly enlarged thyroid on Exam. TSH wnl. PTH pending ***. Talked to IR, recommended outpatient percutaneous sampling.    History of Uncontrolled  Hypertention Pt's BP ran high during this admission with systolic 170's. Permission hypertension maintained initially. BP managed with IV Hydralazine 10 mg PRN. Due to consistence high BP's. He was started on Amlodipine 5 mg daily.    Mild Hypercalcemia Calcium high between 11.3-11.5. 1 L LR bolus given in ED. Vitamin D low at 15.09. PTH and Calcitriol levels ***  High suspicious of OSA Pt endorced snoring and apnea. Outpatient sleep study recommended.   Subjective on day of discharge: ***  Discharge Exam:   BP (!) 168/83 (BP Location: Right Arm)   Pulse 65   Temp 97.7 F (36.5 C) (Oral)   Resp 20   SpO2 100%  Discharge exam: Physical Exam  Pertinent Labs, Studies, and Procedures:   CT ANGIO HEAD NECK W WO CM  Result Date: 02/28/2021 CLINICAL DATA:  Right arm weakness and numbness, left cerebral infarcts on MRI EXAM: CT ANGIOGRAPHY HEAD AND NECK TECHNIQUE: Multidetector CT imaging of the head and neck was performed using the standard protocol during bolus administration of intravenous contrast. Multiplanar CT image reconstructions and MIPs were obtained to evaluate the vascular anatomy. Carotid stenosis measurements (when applicable) are obtained utilizing NASCET criteria, using the distal internal carotid diameter as the denominator. CONTRAST:  20mL OMNIPAQUE IOHEXOL 350 MG/ML SOLN COMPARISON:  CT head 02/27/2021. Correlation made with MRI 02/27/2021 FINDINGS: CT HEAD Brain: Multiple small left cerebral hemisphere infarcts are better seen on the prior MRI. There is no acute intracranial hemorrhage. No new loss of gray-white differentiation. Stable findings of probable chronic microvascular ischemic changes. No extra-axial fluid collection. Vascular: No new findings. Skull: Calvarium is unremarkable. Sinuses/Orbits: No acute finding. Other: None. Review of the MIP images confirms the above findings CTA NECK Aortic arch: Great vessel origins are patent. Right carotid system: Patent.  No stenosis at the ICA origins. Left carotid system: Patent.  No stenosis at the ICA origin. Vertebral arteries: Patent and codominant.  No stenosis. Skeleton: Mild degenerative changes of the cervical spine. Other neck: As seen on recent chest CT, there is a 3-4 cm mass abutting or arising from the left thyroid lobe. Upper chest: Visualized lung apices are clear. Review of the MIP images confirms the above findings CTA HEAD Anterior circulation: Intracranial internal carotid arteries are patent with mild calcified plaque. Anterior cerebral arteries are patent. Right A1 ACA is hypoplastic. Right middle cerebral artery is patent. There is severe stenosis of the proximal to mid left M1 MCA. Flow within the distal  left MCA branches appears relatively decreased compared to the right. Posterior circulation: Intracranial vertebral arteries are patent. Basilar artery is patent. Major cerebellar artery origins patent. Posterior cerebral arteries are patent. Venous sinuses: Patent as allowed by contrast bolus timing. Review of the MIP images confirms the above findings IMPRESSION: No acute intracranial hemorrhage. Small infarcts better seen on prior MRI. No hemodynamically significant stenosis in the neck. Severe left M1 MCA stenosis with reduced flow in the distal left MCA branches compared to the right. 3-4 cm mass abutting or arising from the left thyroid lobe. Ultrasound is recommended for further evaluation. Electronically Signed   By: Guadlupe Spanish M.D.   On: 02/28/2021 11:39   DG Chest 2 View  Result Date: 02/28/2021 CLINICAL DATA:  Chest pain. EXAM: CHEST - 2 VIEW COMPARISON:  02/16/2021 FINDINGS: The lungs are clear without focal pneumonia, edema, pneumothorax or pleural effusion. Interstitial markings are diffusely coarsened with chronic features. The cardio pericardial silhouette is enlarged. The visualized bony structures of the thorax show no acute abnormality. Telemetry leads overlie the chest. IMPRESSION: No active cardiopulmonary disease. Electronically Signed   By: Kennith Center M.D.   On: 02/28/2021 07:32   CT HEAD WO CONTRAST  Result Date: 02/27/2021 CLINICAL DATA:  Right arm weakness over the last 2 days. Question stroke. EXAM: CT HEAD WITHOUT CONTRAST TECHNIQUE: Contiguous axial images were obtained from the base of the skull through the vertex without intravenous contrast. COMPARISON:  None. FINDINGS: Brain: No focal abnormality seen affecting the  brainstem or cerebellum. Cerebral hemispheres show low-density within the white matter consistent with small vessel ischemic change. The age of these white matter insults is indeterminate. Whereas most are likely old, a recent white matter infarction is not  excluded. No cortical or large vessel territory infarction is seen. No mass, hemorrhage, hydrocephalus or extra-axial collection. Vascular: There is atherosclerotic calcification of the major vessels at the base of the brain. Skull: Negative Sinuses/Orbits: Clear/normal Other: None IMPRESSION: No acute finding by CT. Areas of low-density in the cerebral hemispheric white matter consistent with small vessel ischemic change. This is presumed to be chronic, but a recent small vessel infarction could be hidden within the chronic insults. Electronically Signed   By: Paulina FusiMark  Shogry M.D.   On: 02/27/2021 19:05   MR BRAIN WO CONTRAST  Result Date: 02/27/2021 CLINICAL DATA:  Initial evaluation for neuro deficit, stroke suspected, right-sided weakness. EXAM: MRI HEAD WITHOUT CONTRAST TECHNIQUE: Multiplanar, multiecho pulse sequences of the brain and surrounding structures were obtained without intravenous contrast. COMPARISON:  Prior CT from earlier the same day. FINDINGS: Brain: Cerebral volume within normal limits for age. Patchy T2/FLAIR hyperintensity within the periventricular and deep white matter both cerebral hemispheres most consistent with chronic small vessel ischemic disease, moderate for age. Patchy involvement of the pons noted. Superimposed small remote lacunar infarct at the right thalamus. Multiple scattered cortical and subcortical foci of restricted diffusion seen involving the left cerebral hemisphere, with involvement of the left frontal, parietal, temporal, and occipital lobes. For reference purposes, the largest distinct area of infarction seen at the posterior left frontal corona radiata and measures approximately 1 cm. No associated hemorrhage or mass effect. No other evidence for acute or subacute ischemia. Gray-white matter differentiation otherwise maintained. Multiple scattered punctate chronic micro hemorrhages noted about the cerebellum and thalami, likely related to chronic underlying  hypertension. No mass lesion, midline shift or mass effect. No hydrocephalus or extra-axial fluid collection. Pituitary gland suprasellar region within normal limits. Midline structures intact. Vascular: Major intracranial vascular flow voids are maintained. Skull and upper cervical spine: Craniocervical junction normal. Bone marrow signal intensity within normal limits. No scalp soft tissue abnormality. Sinuses/Orbits: Globes and orbital soft tissues within normal limits. Scattered mucosal thickening noted within the ethmoidal air cells and maxillary sinuses. Paranasal sinuses are otherwise clear. Trace bilateral mastoid effusions, of doubtful significance. Other: None. IMPRESSION: 1. Multiple scattered cortical and subcortical acute ischemic nonhemorrhagic left cerebral infarcts as above. No associated hemorrhage or mass effect. 2. Underlying moderate chronic microvascular ischemic disease. 3. Multiple chronic micro hemorrhages clustered about the cerebellum and thalami, suggesting chronic poorly controlled hypertension. Electronically Signed   By: Rise MuBenjamin  McClintock M.D.   On: 02/27/2021 22:53   US THYROID  Result Date: 03/01/2021 CLINICAL DATA:  Palpable abnormality.  Palpable thyroid nodule. EXAM: THYROID ULTRASOUND TECHNIQUE: Ultrasound examination of the thyroid gland and adjacent soft tissues was performed. COMPARISON:  None. FINDINGS: Parenchymal Echotexture: Normal Isthmus: Normal in size measuring 0.2 cm in diameter Right lobe: Normal in size measuring 5.3 x 2.0 x 2.2 cm Left lobe: Enlarged measuring 6.0 x 3.0 x 2.6 cm _________________________________________________________ Estimated total number of nodules >/= 1 cm: 1 Number of spongiform nodules >/=  2 cm not described below (TR1): 0 Number of mixed cystic and solid nodules >/= 1.5 cm not described below (TR2): 0 _________________________________________________________ Nodule # 1: Location: Left; Inferior Maximum size: 3.7 cm; Other 2  dimensions: 3.5 x 3.4 cm Composition: solid/almost completely solid (2) Echogenicity: hypoechoic (2) Shape: taller-than-wide (  3) Margins: lobulated/irregular (2) Echogenic foci: none (0) ACR TI-RADS total points: 9. ACR TI-RADS risk category: TR5 (>/= 7 points). ACR TI-RADS recommendations: **Given size (>/= 1.0 cm) and appearance, fine needle aspiration of this highly suspicious nodule should be considered based on TI-RADS criteria. _________________________________________________________ IMPRESSION: Solitary 3.7 cm TR5 (highly suspicious) right-sided thyroid nodule meets imaging criteria to recommend percutaneous sampling. The above is in keeping with the ACR TI-RADS recommendations - J Am Coll Radiol 2017;14:587-595. Electronically Signed   By: Simonne Come M.D.   On: 03/01/2021 09:49    ***  Discharge Instructions: Discharge Instructions    Ambulatory referral to Neurology   Complete by: As directed    Follow up with stroke clinic NP (Jessica Vanschaick or Darrol Angel, if both not available, consider Manson Allan, or Ahern) at [facility] in about 4 weeks. Thanks.      Signed: Karsten Ro, MD 03/03/2021, 4:18 PM   Pager: [phone]

## 2021-03-03 NOTE — PMR Pre-admission (Signed)
PMR Admission Coordinator Pre-Admission Assessment  Patient: Charles Callahan is an 50 y.o., male MRN: [id number] DOB: 1971-08-13 Height:   Weight:                Insurance Information HMO:     PPO:      PCP:      IPA:      80/20:      OTHER:  PRIMARY: PT UNINSURED      Policy#:       Subscriber:  CM Name:       Phone#:      Fax#:  Pre-Cert#:       Employer:  Benefits:  Phone #:      Name:  Eff. Date:      Deduct:       Out of Pocket Max:       Life Max:   CIR:       SNF:  Outpatient:      Co-Pay:  Home Health:       Co-Pay:  DME:      Co-Pay:  Providers:  SECONDARY:       Policy#:       Phone#:   Financial Counselor: Fabio Neighbors      Phone#: [phone]  The "Data Collection Information Summary" for patients in Inpatient Rehabilitation Facilities with attached "Privacy Act Statement-Health Care Records" was provided and verbally reviewed with: N/A  Emergency Contact Information Contact Information    Name Relation Home Work Mobile   Huesca,TAMMY Spouse   [phone]   Caradonna,COURNTEY Daughter   [phone]     Current Medical History  Patient Admitting Diagnosis: CVA  History of Present Illness:  Charles Callahan is a 50 year old right-handed male history of diastolic congestive heart failure as well as hypertension and tobacco use. Presented 02/27/2021 with acute onset of right side weakness.  Cranial CT scan showed no acute findings.  Area of low density in the cerebral hemisphere white matter consistent with small vessel ischemic change.  Patient did not receive tPA.  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  No associated hemorrhage or mass-effect.  CT angiogram of the head and neck showed no hemodynamically significant stenosis of the neck however there was a 3-4 cm mass abutting or arising from the left thyroid lobe.  Ultrasound of thyroid showed a solitary 3.7 cm right side thyroid nodule recommending percutaneous  sampling which could be done as outpatient.  Echocardiogram with ejection fraction of 55 to 60% no wall motion abnormalities grade 2 diastolic dysfunction.  Admission chemistries unremarkable except sodium 133 urine drug screen negative alcohol negative urinalysis negative nitrite.  Currently maintained on aspirin 325 mg daily and Plavix 75 mg daily for CVA prophylaxis x3 months then aspirin alone.  Subcutaneous Lovenox for DVT prophylaxis.  Tolerating a regular diet.  Therapy evaluations completed due to patient's right side weakness was recommended for a comprehensive rehab program.    Complete NIHSS TOTAL: 5    Past Medical History  Past Medical History:  Diagnosis Date  . CHF (congestive heart failure) (HCC)   . Hypertension     Family History  family history includes Diabetes in his mother; Hypertension in his mother.  Prior Rehab/Hospitalizations:  Has the patient had prior rehab or hospitalizations prior to admission? No  Has the patient had major surgery during 100 days prior to admission? No  Current Medications   Current Facility-Administered Medications:  .   stroke: mapping our  early stages of recovery book, , Does not apply, Once, Agyei, Obed K, MD .  acetaminophen (TYLENOL) tablet 650 mg, 650 mg, Oral, Q4H PRN, 650 mg at 03/05/21 0912 **OR** acetaminophen (TYLENOL) 160 MG/5ML solution 650 mg, 650 mg, Per Tube, Q4H PRN **OR** acetaminophen (TYLENOL) suppository 650 mg, 650 mg, Rectal, Q4H PRN, Agyei, Obed K, MD .  amLODipine (NORVASC) tablet 10 mg, 10 mg, Oral, Daily, Doda, Vandana, MD, 10 mg at 03/05/21 0913 .  aspirin EC tablet 325 mg, 325 mg, Oral, Daily, Marvel Plan, MD, 325 mg at 03/05/21 0912 .  atorvastatin (LIPITOR) tablet 80 mg, 80 mg, Oral, Daily, Agyei, Obed K, MD, 80 mg at 03/05/21 0912 .  cholecalciferol (VITAMIN D3) tablet 1,000 Units, 1,000 Units, Oral, Daily, Karsten Ro, MD, 1,000 Units at 03/05/21 1126 .  clopidogrel (PLAVIX) tablet 75 mg, 75 mg, Oral,  Daily, Erick Blinks, MD, 75 mg at 03/05/21 0912 .  enoxaparin (LOVENOX) injection 40 mg, 40 mg, Subcutaneous, Q24H, Agyei, Obed K, MD, 40 mg at 03/04/21 2203 .  hydrALAZINE (APRESOLINE) tablet 10 mg, 10 mg, Oral, Q6H PRN, Agyei, Obed K, MD .  lidocaine (LIDODERM) 5 % 1 patch, 1 patch, Transdermal, Q24H, Raulkar, Drema Pry, MD, 1 patch at 03/04/21 1515 .  lisinopril (ZESTRIL) tablet 2.5 mg, 2.5 mg, Oral, Daily, Doda, Vandana, MD, 2.5 mg at 03/05/21 0912 .  nicotine (NICODERM CQ - dosed in mg/24 hours) patch 14 mg, 14 mg, Transdermal, Daily, Agyei, Obed K, MD, 14 mg at 03/04/21 2204 .  pantoprazole (PROTONIX) EC tablet 40 mg, 40 mg, Oral, Daily, Agyei, Obed K, MD, 40 mg at 03/05/21 0912 .  senna-docusate (Senokot-S) tablet 1 tablet, 1 tablet, Oral, QHS PRN, Yvette Rack, MD  Patients Current Diet:  Diet Order            Diet Heart Room service appropriate? Yes; Fluid consistency: Thin  Diet effective now                 Precautions / Restrictions Precautions Precautions: Fall Restrictions Weight Bearing Restrictions: No   Has the patient had 2 or more falls or a fall with injury in the past year?No  Prior Activity Level Community (5-7x/wk): driving, gets out of house daily  Prior Functional Level Prior Function Level of Independence: Independent  Self Care: Did the patient need help bathing, dressing, using the toilet or eating?  Independent  Indoor Mobility: Did the patient need assistance with walking from room to room (with or without device)? Independent  Stairs: Did the patient need assistance with internal or external stairs (with or without device)? Independent  Functional Cognition: Did the patient need help planning regular tasks such as shopping or remembering to take medications? Independent  Home Assistive Devices / Equipment Home Equipment: None  Prior Device Use: Indicate devices/aids used by the patient prior to current illness, exacerbation or  injury? None of the above  Current Functional Level Cognition  Overall Cognitive Status: Within Functional Limits for tasks assessed Orientation Level: Oriented X4 General Comments: Very motivated    Extremity Assessment (includes Sensation/Coordination)  Upper Extremity Assessment: RUE deficits/detail RUE Deficits / Details: Able to performing digit flexion/extension; finger opposition, wrist flexion/extension, and elbow flexion/extension. Difficulty with shoulder forward flexion. noting compensatory hiking. RUE Sensation: decreased light touch RUE Coordination: decreased fine motor,decreased gross motor  Lower Extremity Assessment: Defer to PT evaluation RLE Deficits / Details: Grossly 1/5 in ankle DF, and 2/5 throughout rest of LE. Was able to feel touch,  however, reports it is less than normal.    ADLs  Overall ADL's : Needs assistance/impaired Eating/Feeding: Minimal assistance,With adaptive utensils,Sitting Eating/Feeding Details (indicate cue type and reason): Focusing first half of session on eating breakfast. Pt using built up handle to increase success with grasp fork. Pt presenting with increased in hand manipulation. However, requiring cues to problem solve how to sequence in hand manipulation. As sesssion progress, pt becoming more confident with sequencing and required cues to initate in hand manipulation. Min A for support at elbow to optimzie bringing fork to mouth as pt with tednecy to lean forward to fork Grooming: Wash/dry hands,Wash/dry face,Oral care,Applying deodorant,Supervision/safety,Sitting Upper Body Bathing: Supervision/ safety,Sitting Lower Body Bathing: Min guard,Sit to/from stand Upper Body Dressing : Supervision/safety,Sitting Lower Body Dressing: Min guard,Sitting/lateral leans Functional mobility during ADLs: Minimal assistance,Rolling walker General ADL Comments: Pt performing sit<>stand, self feeding, and UE exercises.    Mobility  Overal bed  mobility: Needs Assistance Bed Mobility: Supine to Sit Supine to sit: Supervision Sit to supine: Min guard General bed mobility comments: Use of rail to get to EOB, encouraged use of RUE to assist with functional transfers.    Transfers  Overall transfer level: Needs assistance Equipment used: None Transfers: Sit to/from Stand Sit to Stand: Min guard Stand pivot transfers: Min guard General transfer comment: Cues to push through BUEs and place equal weight through BLEs during transition, as well as anterior translation to stand from EOB x1, from chair x3. Pt favors WB through LLE. Min A needed. SPT bed to chair with Min guard assist.    Ambulation / Gait / Stairs / Wheelchair Mobility  Ambulation/Gait Ambulation/Gait assistance: Editor, commissioningMin assist Gait Distance (Feet): 16 Feet (x2 bouts) Assistive device:  (rail in hallway) Gait Pattern/deviations: Step-through pattern,Decreased dorsiflexion - right,Decreased stride length,Decreased step length - left,Decreased weight shift to right,Narrow base of support General Gait Details: Use of rail on LUE in hallway; assist needed to increase hip/knee flexion during swing phase on right as well as stabilize right knee during stance phase and weight shift towards right side to advance LLE. Decreased DF RLE during swing. 1 seated rest break. Gait velocity: decreased Gait velocity interpretation: <1.31 ft/sec, indicative of household ambulator    Posture / Balance Dynamic Sitting Balance Sitting balance - Comments: no balance issues with unsupported sitting Balance Overall balance assessment: Needs assistance Sitting-balance support: Feet supported,No upper extremity supported Sitting balance-Leahy Scale: Good Sitting balance - Comments: no balance issues with unsupported sitting Standing balance support: Bilateral upper extremity supported Standing balance-Leahy Scale: Poor Standing balance comment: using RW for in room mobility    Special needs/care  consideration Designated visitor Thayer OhmCourtney Johannsen, daughter     Previous Home Environment (from acute therapy documentation) Living Arrangements: Parent  Lives With: Family Available Help at Discharge: Family,Available PRN/intermittently Type of Home: House Home Layout: One level Home Access: Level entry Bathroom Shower/Tub: Engineer, manufacturing systemsTub/shower unit Bathroom Toilet: Standard Bathroom Accessibility: Yes How Accessible: Accessible via walker Home Care Services: No  Discharge Living Setting Plans for Discharge Living Setting: Patient's home Type of Home at Discharge: House Discharge Home Layout: One level Discharge Home Access: Level entry Discharge Bathroom Shower/Tub: Tub/shower unit Discharge Bathroom Toilet: Standard Discharge Bathroom Accessibility: Yes How Accessible: Accessible via walker Does the patient have any problems obtaining your medications?: Yes (Describe) (uninsured)  Social/Family/Support Systems Anticipated Caregiver: Thayer OhmCourtney Oyola, daughter Anticipated Caregiver's Contact Information: [phone] Caregiver Availability: Intermittent Discharge Plan Discussed with Primary Caregiver: Yes Is Caregiver In Agreement with Plan?: Yes Does  Caregiver/Family have Issues with Lodging/Transportation while Pt is in Rehab?: No   Goals Patient/Family Goal for Rehab:  PT/OT mod I/Supervision, SLP indep Expected length of stay: 7-10 days Pt/Family Agrees to Admission and willing to participate: Yes Program Orientation Provided & Reviewed with Pt/Caregiver Including Roles  & Responsibilities: Yes  Barriers to Discharge: Decreased caregiver support   Decrease burden of Care through IP rehab admission: NA   Possible need for SNF placement upon discharge:Not anticipated   Patient Condition: This patient's condition remains as documented in the consult dated 03/03/21, in which the Rehabilitation Physician determined and documented that the patient's condition is appropriate for  intensive rehabilitative care in an inpatient rehabilitation facility. Will admit to inpatient rehab today.  Preadmission Screen Completed By: Wolfgang Phoenix, CCC-SLP, with updates day of admission by  Stephania Fragmin, PT, 03/05/2021 1:09 PM ______________________________________________________________________   Discussed status with Dr. Allena Katz on 03/05/21 at 1:09 PM  and received approval for admission today.  Admission Coordinator:  Stephania Fragmin, PT, DPT 1:09 Delight Stare Dorna Bloom 03/05/21

## 2021-03-03 NOTE — Progress Notes (Signed)
Occupational Therapy Treatment Patient Details Name: Charles Callahan MRN: [id number] DOB: 1971-08-31 Today's Date: 03/03/2021    History of present illness 50 y/o male admitted 5/12 secondary to worsening  R UE and RLE weakness, numbness. Imaging revealed multiple L hemisphere infarcts. PMH includes HTN, tobacco use, and substance abuse.   OT comments  Pt progressing towards established OT goals. Pt highly motivated to participate in therapy and return to PLOF. Facilitating normalized movement at RUE during self feeding task. Pt eating breakfast with Min A for elbow support and Mod-Max cues to sequence movement at RUE. Pt participate in dowel rod exercises for BUEs to facilitate normalized AROM of RUE. Continue to recommend dc to CIR and will continue to follow acutely as admitted.    Follow Up Recommendations  CIR    Equipment Recommendations  Tub/shower seat    Recommendations for Other Services      Precautions / Restrictions Precautions Precautions: Fall Restrictions Weight Bearing Restrictions: No       Mobility Bed Mobility               General bed mobility comments: OOB in chair before and after session    Transfers Overall transfer level: Needs assistance Equipment used: Rolling walker (2 wheeled) Transfers: Sit to/from Stand Sit to Stand: Min guard Stand pivot transfers: Min guard       General transfer comment: Tactile cues and faiclitating of weight bearing through RUE when pushing up into standing. Min Guard A for safety    Balance Overall balance assessment: Needs assistance Sitting-balance support: No upper extremity supported;Feet supported Sitting balance-Leahy Scale: Good Sitting balance - Comments: no balance issues with unsupported sitting at edge of chair   Standing balance support: Single extremity supported;During functional activity;Bilateral upper extremity supported Standing balance-Leahy Scale: Poor Standing balance comment: in  standing with HHA bil sides- focused on decreasing upper trunk rotation as pt has left shouler forward, then focused on getting pt to bring head/body into midline with PTA as visual vertical guide. then progressed to working on lateral weight shifting toward both directions with continued cues to maintain uppert trunk positioning. Then progressed to staying on right side while stepping left foot forward/backwards, then back to midline. Performed this for 6 reps with visable muscle tremors in right LE with last 2 reps.                           ADL either performed or assessed with clinical judgement   ADL Overall ADL's : Needs assistance/impaired Eating/Feeding: Minimal assistance;With adaptive utensils;Sitting Eating/Feeding Details (indicate cue type and reason): Focusing first half of session on eating breakfast. Pt using built up handle to increase success with grasp fork. Pt presenting with increased in hand manipulation. However, requiring cues to problem solve how to sequence in hand manipulation. As sesssion progress, pt becoming more confident with sequencing and required cues to initate in hand manipulation. Min A for support at elbow to optimzie bringing fork to mouth as pt with tednecy to lean forward to fork                                 Functional mobility during ADLs: Minimal assistance General ADL Comments: Pt performing sit<>stand, self feeding, and UE exercises.     Vision       Ambulance person  Arousal/Alertness: Awake/alert Behavior During Therapy: WFL for tasks assessed/performed Overall Cognitive Status: Within Functional Limits for tasks assessed                                 General Comments: Very motivated        Exercises Exercises: Other exercises General Exercises - Lower Extremity Long Arc Quad: AAROM;Right;10 reps;Seated Hip Flexion/Marching: AAROM;Right;10 reps;Seated Toe Raises:  AAROM;Strengthening;Both;10 reps;Seated;Limitations Toe Raises Limitations: assistance needed with right foot Other Exercises Other Exercises: dowel rod with BUEs hand in pronation and shoulder width apart. low position forward pushing at hip height to promote elbow extension and shoulder forward flexion. x20. cues for pushing both hands at same time and repositioning R hand Other Exercises: dowel rod with BUEs hand in supination and shoulder width apart. bicep curls. x20. cues for moving BUEs at same time and repositioning R hand to look the same as L. Other Exercises: RUE AROM from R armrest to cross body and position at L arm rest. x10. increased effort and time.   Shoulder Instructions       General Comments      Pertinent Vitals/ Pain       Pain Assessment: Faces Faces Pain Scale: Hurts little more Pain Location: Rt shoulder Pain Descriptors / Indicators: Aching Pain Intervention(s): Repositioned;Monitored during session  Home Living                                          Prior Functioning/Environment              Frequency  Min 2X/week        Progress Toward Goals  OT Goals(current goals can now be found in the care plan section)  Progress towards OT goals: Progressing toward goals  Acute Rehab OT Goals Patient Stated Goal: to get my leg and arm moving OT Goal Formulation: With patient Time For Goal Achievement: 03/15/21 Potential to Achieve Goals: Good ADL Goals Pt Will Perform Grooming: with set-up;sitting;standing Pt Will Perform Upper Body Bathing: with set-up;sitting;standing Pt Will Perform Lower Body Bathing: with set-up;sit to/from stand Pt Will Perform Upper Body Dressing: with set-up;sitting;standing Pt Will Perform Lower Body Dressing: with set-up;sit to/from stand Pt Will Transfer to Toilet: with supervision;ambulating;regular height toilet Pt Will Perform Toileting - Clothing Manipulation and hygiene: with set-up;sit to/from  stand Pt/caregiver will Perform Home Exercise Program: Increased ROM;Increased strength;Right Upper extremity;With written HEP provided;Independently  Plan Discharge plan remains appropriate    Co-evaluation                 AM-PAC OT "6 Clicks" Daily Activity     Outcome Measure   Help from another person eating meals?: A Little Help from another person taking care of personal grooming?: A Little Help from another person toileting, which includes using toliet, bedpan, or urinal?: A Little Help from another person bathing (including washing, rinsing, drying)?: A Lot Help from another person to put on and taking off regular upper body clothing?: A Little Help from another person to put on and taking off regular lower body clothing?: A Lot 6 Click Score: 16    End of Session Equipment Utilized During Treatment: Rolling walker (dowel rod)  OT Visit Diagnosis: Unsteadiness on feet (R26.81);Other abnormalities of gait and mobility (R26.89);Muscle weakness (generalized) (M62.81);Hemiplegia and hemiparesis Hemiplegia - Right/Left: Right  Hemiplegia - dominant/non-dominant: Dominant   Activity Tolerance Patient tolerated treatment well   Patient Left in chair;with call bell/phone within reach   Nurse Communication Mobility status;Other (comment) (no chair alarms)        Time: 7619-5093 OT Time Calculation (min): 35 min  Charges: OT General Charges $OT Visit: 1 Visit OT Treatments $Self Care/Home Management : 8-22 mins $Therapeutic Activity: 8-22 mins  Deigo Alonso MSOT, OTR/L Acute Rehab Pager: [phone] Office: [phone]   Theodoro Grist Brantley Naser 03/03/2021, 12:36 PM

## 2021-03-03 NOTE — Progress Notes (Signed)
Subjective:  O/N Events: None Pt doing better and has worked with PT yesterday when he was able to walk with the help of walker. Pt states he is able to lift his Rt arm without falling it down, states he was not able to do that 2 days ago. Discussed plan of going to inpatient rehab and need for outpatient needle biopsy of thyroid nodule. Pt agrees with plan.   Objective:  Vital signs in last 24 hours: Vitals:   03/02/21 1543 03/02/21 1945 03/02/21 2344 03/03/21 0333  BP: (!) 147/90 (!) 177/101 (!) 177/108 (!) 176/94  Pulse: (!) 49 62 61 (!) 54  Resp: 18 18 19 18   Temp: 98.3 F (36.8 C) 97.7 F (36.5 C) 97.9 F (36.6 C) 98.1 F (36.7 C)  TempSrc: Oral Oral Oral Oral  SpO2: 98% 98% 100% 94%   Physical Exam Constitutional: Pt sitting on chair, not in acute distress. HEENT: Mildly enlarged thyroid on exam.  Cardiovascular: Regular rate and rythm Skin: warm and dry. B/L Toe nails dystrophic. Dry skin on heals but no pressure ulcers.  Neurological:  Pt is alert and oriented x 4. Decreased sensations on Rt side of face and forehead otherwise Cranial nerves II- XII grossly intact. 3/5 strength in RUE and RLE. 5/5 strength in LUE and LLE.  Decreased sensation on Rt arm and Rt leg    CBC Latest Ref Rng & Units 03/02/2021 03/01/2021 02/27/2021  WBC 4.0 - 10.5 K/uL 4.4 4.1 5.6  Hemoglobin 13.0 - 17.0 g/dL 04/29/2021 12.4 58.0  Hematocrit 39.0 - 52.0 % 43.9 41.0 47.2  Platelets 150 - 400 K/uL 232 222 281    BMP Latest Ref Rng & Units 03/03/2021 03/02/2021 03/01/2021  Glucose 70 - 99 mg/dL 03/03/2021) 338(S) 505(L)  BUN 6 - 20 mg/dL 10 6 8   Creatinine 0.61 - 1.24 mg/dL 976(B 3.41  Sodium 135 - 145 mmol/L 134(L) 134(L) 135  Potassium 3.5 - 5.1 mmol/L 4.3 4.0 3.7  Chloride 98 - 111 mmol/L 102 105 105  CO2 22 - 32 mmol/L 28 26 25   Calcium 8.9 - 10.3 mg/dL 11.3(H) 10.7(H) 10.5(H)     Assessment/Plan:  Principal Problem:   Acute CVA (cerebrovascular accident) [facility]) Active Problems:    Hypertension   Substance use disorder Thyroid nodule Cocaine use disorder Tobacco use disorder  Mr. Manheim is a 50 year old gentleman with medical history significant for poorly controlled hypertension, GERD, cocaine use disorder (last use 1 month ago), a and tobacco disorder here for management of acute CVA.  Acute Scattered cortical and subcortical Non hemorrhagic CVA Pt doing better, states his hand and leg weakness has improved compared to yesterday. Etiology likely due to Left M1 high grade stenosis seen on CT angio. Risk factors includes long standing uncontrolled hypertension, smoking and cocaine use. Echo with LVEF 55-60%. Moderate L ventricular hypertrophy, Grad II Diastolic dysfunction. Pt on Plavix 75 mg and ASA 325 mg. Neuro recommended continuing DAPT for 3 months and then ASA alone.  OT/PT recommended CIR. Pressures overnight are elevated with systolic between [facility] and diastolic between Vilma Prader mmHg.  Last BP 176/94 mmHg.   - Appreciate Neurology's recommendations - Continue ASA 325mg  plus Plavix 75 mg daily for 3 months then stand alone ASA.  - Lipitor 80mg  daily  --Start Amlodipine 5 mg Daily (BP goal 130-150 mmHg because of stenosis) -- TOC consult for CIR placement.  Thyroid Nodule Incidental finding on CT angio. U/S Showing a 3.7 cm (Max), 3.5 x3.4 hypoechoic, lobulated  nodule. Mildly enlarged thyroid on Exam. TSH wnl. PTH pending - Outpatient percutaneous sampling.    History of Uncontrolled  Hypertention BP running high since admission. Last BP- 176/94 mmHg.  -IV Hydralazine 10 mg PRN  (BP Goal systolic 130-150 mmHg).  -Start Amlodipine 5 mg daily  Mild Hypercalcemia Calcium 11.3. Corrected for albumin. Corrected Calcium 11.5. Vitamin D low at 15.09, PTH and Calcitriol levels pending.  --F/U PTH and Calcitrol levels.   Prior to Admission Living Arrangement: Home Anticipated Discharge Location : Possibly CIR Barriers to Discharge: Continued medical management.    Karsten Ro, MD 03/03/2021, 7:39 AM Pager: [phone] After 5pm on weekdays and 1pm on weekends: On Call pager [phone]

## 2021-03-04 LAB — BASIC METABOLIC PANEL
Anion gap: 3 — ABNORMAL LOW (ref 5–15)
BUN: 15 mg/dL (ref 6–20)
CO2: 27 mmol/L (ref 22–32)
Calcium: 11.7 mg/dL — ABNORMAL HIGH (ref 8.9–10.3)
Chloride: 103 mmol/L (ref 98–111)
Creatinine, Ser: 0.96 mg/dL (ref 0.61–1.24)
GFR, Estimated: >60 mL/min (ref >60)
Glucose, Bld: 104 mg/dL — ABNORMAL HIGH (ref 70–99)
Potassium: 4.3 mmol/L (ref 3.5–5.1)
Sodium: 133 mmol/L — ABNORMAL LOW (ref 135–145)

## 2021-03-04 LAB — PARATHYROID HORMONE, INTACT (NO CA): PTH: 63 pg/mL (ref 15–65)

## 2021-03-04 LAB — CALCITRIOL (1,25 DI-OH VIT D): Vit D, 1,25-Dihydroxy: 63.6 pg/mL (ref 19.9–79.3)

## 2021-03-04 MED ORDER — LISINOPRIL 2.5 MG PO TABS
2.5000 mg | ORAL_TABLET | Freq: Every day | ORAL | Status: DC
  Administered 2021-03-04 – 2021-03-05 (×2): 2.5 mg via ORAL
  Filled 2021-03-04 (×2): qty 1

## 2021-03-04 MED ORDER — AMLODIPINE BESYLATE 10 MG PO TABS
10.0000 mg | ORAL_TABLET | Freq: Every day | ORAL | Status: DC
  Administered 2021-03-04 – 2021-03-05 (×2): 10 mg via ORAL
  Filled 2021-03-04 (×2): qty 1

## 2021-03-04 NOTE — Progress Notes (Signed)
Physical Therapy Treatment Patient Details Name: Charles Callahan MRN: [id number] DOB: [birth year] Today's Date: 03/04/2021    History of Present Illness 50 y/o male admitted 5/12 secondary to worsening  R UE and RLE weakness, numbness. Imaging revealed multiple L hemisphere infarcts. PMH includes HTN, tobacco use, and substance abuse.    PT Comments    Patient progressing well towards PT goals. Continues to have right hemiparesis and needs max cues to use RUE functionally during session. Worked on gait training in hallway with rail and chair follow emphasizing increasing hip/knee flexion/DF during swing phase on right and increasing weight shift towards RLE as well. Performed some standing exercises specifically targeting WB/weight shifting through right side. Requires Min A for transfers and gait training at this time. Education on neuroplasticity and importance of functional use of affected side as pt already developing compensatory patterns for movement. Great CIR candidate. Will follow.    Follow Up Recommendations  Supervision for mobility/OOB;CIR     Equipment Recommendations  None recommended by PT    Recommendations for Other Services       Precautions / Restrictions Precautions Precautions: Fall Restrictions Weight Bearing Restrictions: No    Mobility  Bed Mobility Overal bed mobility: Needs Assistance Bed Mobility: Supine to Sit     Supine to sit: Min guard;HOB elevated     General bed mobility comments: Use of rail to get to EOB, encouraged use of RUE to assist with functional transfers.    Transfers Overall transfer level: Needs assistance Equipment used: None Transfers: Sit to/from Stand Sit to Stand: Min assist Stand pivot transfers: Min guard       General transfer comment: Cues to push through BUEs and place equal weight through BLEs during transition, as well as anterior translation to stand from EOB x1, from chair x3. Pt favors WB through LLE. Min A  needed. SPT bed to chair with Min guard assist.  Ambulation/Gait Ambulation/Gait assistance: Min assist Gait Distance (Feet): 16 Feet (x2 bouts) Assistive device:  (rail in hallway) Gait Pattern/deviations: Step-through pattern;Decreased dorsiflexion - right;Decreased stride length;Decreased step length - left;Decreased weight shift to right;Narrow base of support Gait velocity: decreased Gait velocity interpretation: <1.31 ft/sec, indicative of household ambulator General Gait Details: Use of rail on LUE in hallway; assist needed to increase hip/knee flexion during swing phase on right as well as stabilize right knee during stance phase and weight shift towards right side to advance LLE. Decreased DF RLE during swing. 1 seated rest break.   Stairs             Wheelchair Mobility    Modified Rankin (Stroke Patients Only) Modified Rankin (Stroke Patients Only) Pre-Morbid Rankin Score: No symptoms Modified Rankin: Moderately severe disability     Balance Overall balance assessment: Needs assistance Sitting-balance support: Feet supported;No upper extremity supported Sitting balance-Leahy Scale: Good Sitting balance - Comments: no balance issues with unsupported sitting   Standing balance support: During functional activity Standing balance-Leahy Scale: Fair Standing balance comment: Worked on static standing at end of gait trials emphasizing equal WB through BLEs. Worked on weight shifting towards right and stepping out with LLE in different directions; pain in RLE with WB.                            Cognition Arousal/Alertness: Awake/alert Behavior During Therapy: WFL for tasks assessed/performed Overall Cognitive Status: Within Functional Limits for tasks assessed  General Comments: Very motivated      Exercises      General Comments General comments (skin integrity, edema, etc.): Encouraged use of RUE for  all functional tasks throughout the day as pt favors LUE. Education on neuroplasticity.      Pertinent Vitals/Pain Pain Assessment: Faces Faces Pain Scale: Hurts little more Pain Location: right shoulder and right posterior LE Pain Descriptors / Indicators: Aching;Sore Pain Intervention(s): Monitored during session;Repositioned    Home Living                      Prior Function            PT Goals (current goals can now be found in the care plan section) Progress towards PT goals: Progressing toward goals    Frequency    Min 4X/week      PT Plan Current plan remains appropriate    Co-evaluation              AM-PAC PT "6 Clicks" Mobility   Outcome Measure  Help needed turning from your back to your side while in a flat bed without using bedrails?: A Little Help needed moving from lying on your back to sitting on the side of a flat bed without using bedrails?: A Little Help needed moving to and from a bed to a chair (including a wheelchair)?: A Little Help needed standing up from a chair using your arms (e.g., wheelchair or bedside chair)?: A Little Help needed to walk in hospital room?: A Little Help needed climbing 3-5 steps with a railing? : Total 6 Click Score: 16    End of Session Equipment Utilized During Treatment: Gait belt Activity Tolerance: Patient tolerated treatment well Patient left: in chair;with call bell/phone within reach Nurse Communication: Mobility status PT Visit Diagnosis: Hemiplegia and hemiparesis;Unsteadiness on feet (R26.81);Difficulty in walking, not elsewhere classified (R26.2) Hemiplegia - Right/Left: Right Hemiplegia - dominant/non-dominant: Dominant Hemiplegia - caused by: Cerebral infarction     Time: 1000-1031 PT Time Calculation (min) (ACUTE ONLY): 31 min  Charges:  $Gait Training: 8-22 mins $Neuromuscular Re-education: 8-22 mins                     Vale Haven, PT, DPT Acute Rehabilitation Services Pager  [phone] Office [phone]       Blake Divine A Lanier Ensign 03/04/2021, 11:56 AM

## 2021-03-04 NOTE — Progress Notes (Signed)
Inpatient Rehab Admissions Coordinator:   I have no beds available for this patient to admit to CIR today.  Will continue to follow for timing of potential admission pending bed availability. I stopped by the patient's room to let him know.   Estill Dooms, PT, DPT Admissions Coordinator [phone] 03/04/21  10:44 AM

## 2021-03-04 NOTE — H&P (Signed)
Physical Medicine and Rehabilitation Admission H&P    Chief Complaint  Patient presents with  . Weakness  : HPI: Charles Callahan is a 50 year old right-handed male history of diastolic congestive heart failure as well as hypertension and tobacco use.  Per chart review lives with parent.  Independent prior to admission.  1 level home with level entry.  History taken from chart review and patient.  He presented on 02/27/2021 with acute onset of right side hemiparesis.  Cranial CT scan unremarkable for acute intracranial process.  Area of low density in the cerebral hemisphere white matter consistent with small vessel ischemic change.  Patient did not receive tPA.  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  No associated hemorrhage or mass-effect.  CT angiogram of the head and neck showed no hemodynamically significant stenosis of the neck however there was a 3-4 cm mass abutting or arising from the left thyroid lobe.  Ultrasound of thyroid showed a solitary 3.7 cm right thyroid nodule recommending percutaneous sampling which could be done as outpatient.  Echocardiogram with ejection fraction 55 to 60%, no wall motion abnormalities grade 2 diastolic dysfunction.  Admission chemistries unremarkable except sodium 133, urine drug screen negative alcohol negative urinalysis negative nitrite.  Currently maintained on aspirin 325 mg daily and Plavix 75 mg daily for CVA prophylaxis x3 months then aspirin alone.  Subcutaneous Lovenox for DVT prophylaxis.  Tolerating a regular diet.  Therapy evaluations completed due to patient's right hemiparesis was admitted for a comprehensive rehab program. Please see preadmission assessment from earlier today as well.  Review of Systems  Constitutional: Negative for chills and fever.  HENT: Negative for hearing loss.   Eyes: Negative for blurred vision and double vision.  Respiratory: Negative for cough and  shortness of breath.   Cardiovascular: Negative for chest pain and palpitations.  Gastrointestinal: Positive for constipation. Negative for heartburn, nausea and vomiting.  Genitourinary: Negative for dysuria, flank pain and hematuria.  Musculoskeletal: Positive for myalgias.  Skin: Negative for rash.  Neurological: Positive for focal weakness and weakness.       Occasional headaches  All other systems reviewed and are negative.  Past Medical History:  Diagnosis Date  . CHF (congestive heart failure) (HCC)   . Hypertension    Past Surgical History:  Procedure Laterality Date  . EYE SURGERY     Family History  Problem Relation Age of Onset  . Diabetes Mother   . Hypertension Mother    Social History:  reports that he has been smoking cigarettes. He has been smoking about 1.00 pack per day. He has never used smokeless tobacco. He reports current alcohol use. He reports current drug use. Drug: Cocaine. Allergies:  Allergies  Allergen Reactions  . Morphine And Related Nausea And Vomiting   Medications Prior to Admission  Medication Sig Dispense Refill  . albuterol (PROVENTIL HFA;VENTOLIN HFA) 108 (90 BASE) MCG/ACT inhaler Inhale 1-2 puffs into the lungs every 6 (six) hours as needed for wheezing. (Patient not taking: Reported on 02/28/2021) 1 Inhaler 0  . cetirizine (ZYRTEC) 10 MG tablet Take 1 tablet (10 mg total) by mouth daily. (Patient not taking: Reported on 02/28/2021) 30 tablet 11  . dicyclomine (BENTYL) 20 MG tablet Take 1 tablet (20 mg total) by mouth 2 (two) times daily. (Patient not taking: No sig reported) 20 tablet 0  . HYDROcodone-homatropine (HYCODAN) 5-1.5 MG/5ML syrup Take 5 mLs by mouth every 6 (six) hours as needed for  cough. (Patient not taking: Reported on 02/28/2021) 75 mL 0  . ibuprofen (ADVIL,MOTRIN) 600 MG tablet Take 1 tablet (600 mg total) by mouth every 8 (eight) hours as needed for pain or fever. (Patient not taking: Reported on 02/28/2021) 20 tablet 0  .  lisinopril (ZESTRIL) 20 MG tablet Take 1 tablet (20 mg total) by mouth daily. (Patient not taking: No sig reported) 30 tablet 1  . omeprazole (PRILOSEC) 20 MG capsule Take 1 capsule (20 mg total) by mouth daily. (Patient not taking: Reported on 02/28/2021) 30 capsule 3  . ondansetron (ZOFRAN) 4 MG tablet Take 1 tablet (4 mg total) by mouth every 6 (six) hours. (Patient not taking: Reported on 02/28/2021) 12 tablet 0  . ondansetron (ZOFRAN) 4 MG tablet Take 1 tablet (4 mg total) by mouth every 6 (six) hours. (Patient not taking: No sig reported) 12 tablet 0  . permethrin (ELIMITE) 5 % cream Apply to affected area once (Patient not taking: No sig reported) 60 g 0  . sucralfate (CARAFATE) 1 GM/10ML suspension Take 10 mLs (1 g total) by mouth 4 (four) times daily -  with meals and at bedtime. (Patient not taking: No sig reported) 420 mL 0  . tobramycin (TOBREX) 0.3 % ophthalmic solution Place 1 drop into the right eye every 6 (six) hours. (Patient not taking: No sig reported) 5 mL 0    Drug Regimen Review Drug regimen was reviewed and remains appropriate with no significant issues identified  Home: Home Living Family/patient expects to be discharged to:: Private residence Living Arrangements: Parent Available Help at Discharge: Family,Available PRN/intermittently Type of Home: House Home Access: Level entry Home Layout: One level Bathroom Shower/Tub: Associate ProfessorTub/shower unit Bathroom Toilet: Standard Bathroom Accessibility: Yes Home Equipment: None  Lives With: Family   Functional History: Prior Function Level of Independence: Independent  Functional Status:  Mobility: Bed Mobility Overal bed mobility: Needs Assistance Bed Mobility: Supine to Sit Supine to sit: Min guard,HOB elevated Sit to supine: Min guard General bed mobility comments: Use of rail to get to EOB, encouraged use of RUE to assist with functional transfers. Transfers Overall transfer level: Needs assistance Equipment used:  None Transfers: Sit to/from Stand Sit to Stand: Min assist Stand pivot transfers: Min guard General transfer comment: Cues to push through BUEs and place equal weight through BLEs during transition, as well as anterior translation to stand from EOB x1, from chair x3. Pt favors WB through LLE. Min A needed. SPT bed to chair with Min guard assist. Ambulation/Gait Ambulation/Gait assistance: Min assist Gait Distance (Feet): 16 Feet (x2 bouts) Assistive device:  (rail in hallway) Gait Pattern/deviations: Step-through pattern,Decreased dorsiflexion - right,Decreased stride length,Decreased step length - left,Decreased weight shift to right,Narrow base of support General Gait Details: Use of rail on LUE in hallway; assist needed to increase hip/knee flexion during swing phase on right as well as stabilize right knee during stance phase and weight shift towards right side to advance LLE. Decreased DF RLE during swing. 1 seated rest break. Gait velocity: decreased Gait velocity interpretation: <1.31 ft/sec, indicative of household ambulator    ADL: ADL Overall ADL's : Needs assistance/impaired Eating/Feeding: Minimal assistance,With adaptive utensils,Sitting Eating/Feeding Details (indicate cue type and reason): Focusing first half of session on eating breakfast. Pt using built up handle to increase success with grasp fork. Pt presenting with increased in hand manipulation. However, requiring cues to problem solve how to sequence in hand manipulation. As sesssion progress, pt becoming more confident with sequencing and required cues  to initate in hand manipulation. Min A for support at elbow to optimzie bringing fork to mouth as pt with tednecy to lean forward to fork Upper Body Dressing : Minimal assistance,Sitting Lower Body Dressing: Minimal assistance,Sit to/from stand Functional mobility during ADLs: Minimal assistance General ADL Comments: Pt performing sit<>stand, self feeding, and UE  exercises.  Cognition: Cognition Overall Cognitive Status: Within Functional Limits for tasks assessed Orientation Level: Oriented X4 Cognition Arousal/Alertness: Awake/alert Behavior During Therapy: WFL for tasks assessed/performed Overall Cognitive Status: Within Functional Limits for tasks assessed General Comments: Very motivated  Physical Exam: Blood pressure (!) 161/93, pulse (!) 55, temperature 97.7 F (36.5 C), temperature source Oral, resp. rate 14, SpO2 99 %. Physical Exam Vitals reviewed.  Constitutional:      General: He is not in acute distress.    Appearance: Normal appearance. He is not ill-appearing.  HENT:     Head: Normocephalic and atraumatic.     Right Ear: External ear normal.     Left Ear: External ear normal.     Nose: Nose normal.  Eyes:     General:        Right eye: No discharge.        Left eye: No discharge.     Extraocular Movements: Extraocular movements intact.  Cardiovascular:     Rate and Rhythm: Normal rate and regular rhythm.  Pulmonary:     Effort: Pulmonary effort is normal. No respiratory distress.     Breath sounds: No stridor.  Abdominal:     General: Abdomen is flat. Bowel sounds are normal. There is no distension.  Musculoskeletal:     Cervical back: Normal range of motion and neck supple.     Comments: No edema or tenderness in extremities  Skin:    General: Skin is warm and dry.  Neurological:     Mental Status: He is alert.     Comments: Alert and oriented Makes eye contact with examiner.   Follows commands.   Fair insight and awareness. Motor: LUE/LE: 5/5 proximal distal RUE: Shoulder abduction 2+/5 distally 2-/5 RLE: 3-/5 HF, KE, 1/5 ADF  Psychiatric:        Mood and Affect: Mood normal.        Behavior: Behavior normal.     Results for orders placed or performed during the hospital encounter of 02/27/21 (from the past 48 hour(s))  Basic metabolic panel     Status: Abnormal   Collection Time: 03/04/21  4:28 AM   Result Value Ref Range   Sodium 133 (L) 135 - 145 mmol/L   Potassium 4.3 3.5 - 5.1 mmol/L   Chloride 103 98 - 111 mmol/L   CO2 27 22 - 32 mmol/L   Glucose, Bld 104 (H) 70 - 99 mg/dL    Comment: Glucose reference range applies only to samples taken after fasting for at least 8 hours.   BUN 15 6 - 20 mg/dL   Creatinine, Ser 7.06 0.61 - 1.24 mg/dL   Calcium 23.7 (H) 8.9 - 10.3 mg/dL   GFR, Estimated >62 >83 mL/min    Comment: (NOTE) Calculated using the CKD-EPI Creatinine Equation (2021)    Anion gap 3 (L) 5 - 15    Comment: Performed at [facility], [address], [place], [place] [address]  Basic metabolic panel     Status: Abnormal   Collection Time: 03/05/21  3:09 AM  Result Value Ref Range   Sodium 133 (L) 135 - 145 mmol/L  Potassium 4.1 3.5 - 5.1 mmol/L   Chloride 103 98 - 111 mmol/L   CO2 26 22 - 32 mmol/L   Glucose, Bld 100 (H) 70 - 99 mg/dL    Comment: Glucose reference range applies only to samples taken after fasting for at least 8 hours.   BUN 16 6 - 20 mg/dL   Creatinine, Ser 4.26 0.61 - 1.24 mg/dL   Calcium 83.4 (H) 8.9 - 10.3 mg/dL   GFR, Estimated >19 >62 mL/min    Comment: (NOTE) Calculated using the CKD-EPI Creatinine Equation (2021)    Anion gap 4 (L) 5 - 15    Comment: Performed at [facility], [address], [place], [place] [address]   ECHOCARDIOGRAM COMPLETE BUBBLE STUDY  Result Date: 03/01/2021    ECHOCARDIOGRAM REPORT   Patient Name:   STEVIE CHARTER Date of Exam: 03/01/2021 Medical Rec #:  [id number]    Height:       66.5 in Accession #:    [id number]   Weight:       225.0 lb Date of Birth:  10/30/1970    BSA:          2.114 m Patient Age:    49 years     BP:           187/109 mmHg Patient Gender: M            HR:           62 bpm. [facility] Procedure: 2D Echo, Cardiac Doppler and Color Doppler Indications:    Stroke l63.9  History:        Patient has prior history of Echocardiogram examinations, most                 recent  04/11/2009. CHF; Risk Factors:Hypertension. Substance use                 disorder.  Sonographer:    Celesta Gentile RCS Referring Phys: [phone] WHITNEY PLUNKETT IMPRESSIONS  1. Left ventricular ejection fraction, by estimation, is 55 to 60%. The left ventricle has normal function. The left ventricle has no regional wall motion abnormalities. There is moderate left ventricular hypertrophy. Left ventricular diastolic parameters are consistent with Grade II diastolic dysfunction (pseudonormalization).  2. Right ventricular systolic function is normal. The right ventricular size is normal. Tricuspid regurgitation signal is inadequate for assessing PA pressure.  3. Left atrial size was moderately dilated.  4. There is a trivial pericardial effusion posterior to the left ventricle.  5. The mitral valve is grossly normal. Mild mitral valve regurgitation.  6. The aortic valve is tricuspid. Aortic valve regurgitation is not visualized.  7. The inferior vena cava is normal in size with greater than 50% respiratory variability, suggesting right atrial pressure of 3 mmHg. FINDINGS  Left Ventricle: Left ventricular ejection fraction, by estimation, is 55 to 60%. The left ventricle has normal function. The left ventricle has no regional wall motion abnormalities. The left ventricular internal cavity size was normal in size. There is  moderate left ventricular hypertrophy. Left ventricular diastolic parameters are consistent with Grade II diastolic dysfunction (pseudonormalization). Right Ventricle: The right ventricular size is normal. No increase in right ventricular wall thickness. Right ventricular systolic function is normal. Tricuspid regurgitation signal is inadequate for assessing PA pressure. Left Atrium: Left atrial size was moderately dilated. Right Atrium: Right atrial size was normal in size. Pericardium: Trivial pericardial effusion is present. The pericardial effusion is posterior  to the left ventricle. Mitral Valve: The  mitral valve is grossly normal. Mild mitral valve regurgitation. Tricuspid Valve: The tricuspid valve is grossly normal. Tricuspid valve regurgitation is trivial. Aortic Valve: The aortic valve is tricuspid. There is mild to moderate aortic valve annular calcification. Aortic valve regurgitation is not visualized. Pulmonic Valve: The pulmonic valve was grossly normal. Pulmonic valve regurgitation is trivial. Aorta: The aortic root is normal in size and structure. Venous: The inferior vena cava is normal in size with greater than 50% respiratory variability, suggesting right atrial pressure of 3 mmHg. IAS/Shunts: No atrial level shunt detected by color flow Doppler.  LEFT VENTRICLE PLAX 2D LVIDd:         5.20 cm  Diastology LVIDs:         3.60 cm  LV e' medial:    4.68 cm/s LV PW:         1.20 cm  LV E/e' medial:  15.7 LV IVS:        1.40 cm  LV e' lateral:   5.77 cm/s LVOT diam:     2.10 cm  LV E/e' lateral: 12.8 LV SV:         56 LV SV Index:   27 LVOT Area:     3.46 cm  RIGHT VENTRICLE RV S prime:     10.90 cm/s TAPSE (M-mode): 2.0 cm LEFT ATRIUM             Index       RIGHT ATRIUM           Index LA diam:        4.40 cm 2.08 cm/m  RA Area:     20.50 cm LA Vol (A2C):   77.5 ml 36.66 ml/m RA Volume:   65.10 ml  30.79 ml/m LA Vol (A4C):   93.2 ml 44.08 ml/m LA Biplane Vol: 90.5 ml 42.80 ml/m  AORTIC VALVE LVOT Vmax:   80.90 cm/s LVOT Vmean:  51.400 cm/s LVOT VTI:    0.162 m  AORTA Ao Root diam: 3.10 cm MITRAL VALVE MV Area (PHT): 4.80 cm    SHUNTS MV Decel Time: 158 msec    Systemic VTI:  0.16 m MV E velocity: 73.70 cm/s  Systemic Diam: 2.10 cm MV A velocity: 62.40 cm/s MV E/A ratio:  1.18 Nona Dell MD Electronically signed by Nona Dell MD Signature Date/Time: 03/01/2021/12:47:23 PM    Final        Medical Problem List and Plan: 1.  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  -patient may  shower  -ELOS/Goals: 9-13 days/supervision/Mod I  Admit to CIR 2.  Antithrombotics: -DVT/anticoagulation: Lovenox  -antiplatelet therapy: Aspirin 325 mg daily and Plavix 75 mg daily x3 months then aspirin alone 3. Pain Management: Lidoderm patch 4. Mood: Provide emotional support  -antipsychotic agents: N/A 5. Neuropsych: This patient is capable of making decisions on her own behalf. 6. Skin/Wound Care: Routine skin checks 7. Fluids/Electrolytes/Nutrition: Routine in and outs  CMP ordered for tomorrow 8.  Hypertension.  Norvasc 10 mg daily, lisinopril 2.5 mg daily.    Monitor with increased mobility 9.  Hyperlipidemia: Lipitor 10.  History of diastolic congestive heart failure.    Monitor for signs and symptoms of fluid overload 11.  Tobacco abuse.  NicoDerm patch.  Provide counseling 12.  Thyroid nodule.  Incidental finding.  Outpatient percutaneous sampling.  Mcarthur Rossetti Angiulli, PA-C 03/05/2021  I have personally performed a face to  face diagnostic evaluation, including, but not limited to relevant history and physical exam findings, of this patient and developed relevant assessment and plan.  Additionally, I have reviewed and concur with the physician assistant's documentation above.  Delice Lesch, MD, ABPMR

## 2021-03-04 NOTE — Plan of Care (Signed)
  Problem: Education: Goal: Knowledge of disease or condition will improve Outcome: Progressing Goal: Knowledge of secondary prevention will improve Outcome: Progressing Goal: Knowledge of patient specific risk factors addressed and post discharge goals established will improve Outcome: Progressing   Problem: Coping: Goal: Will verbalize positive feelings about self Outcome: Progressing   Problem: Ischemic Stroke/TIA Tissue Perfusion: Goal: Complications of ischemic stroke/TIA will be minimized Outcome: Progressing   

## 2021-03-04 NOTE — Progress Notes (Signed)
    Subjective:  O/N Events: None Pt states he is doing better and his weakness is improving. He state he is able to lift his Rt hand up higher than yesterday. Pt states he has some pain on the back of her knee which he thinks its muscular. He reports some pain in the shoulder which he gets exaggerated when he moves his Rt arm up. He verbalizes understanding the plan to go to CIR. He states he was evaluated by Rehab coordinator yesterday and he and his family is going to talk to Artist. He denies any concerns.    Objective:  Vital signs in last 24 hours: Vitals:   03/03/21 2318 03/04/21 0336 03/04/21 0715 03/04/21 1136  BP: (!) 168/100 (!) 178/95 (!) 171/102 (!) 155/87  Pulse: 72 (!) 57 (!) 50 (!) 54  Resp:  20 18 20   Temp: 98.1 F (36.7 C) 97.7 F (36.5 C) 97.9 F (36.6 C) (!) 97.4 F (36.3 C)  TempSrc: Oral Oral Oral Oral  SpO2: 95% 92% 98% 98%   Physical Exam Constitutional: Pt lying on the bed comfortably. Not in acute distress.  Musculoskeletal- No redness, swelling or tenderness behind Rt knee. Lidocaine patch on Rt shoulder.  No redness or swelling present.  Skin: warm and dry. Neurological: Pt is alter and oriented x 4.  Sensation on forehead equal on both sides. Decreased sensation on Rt side of middle face and lower face. Cranial nerves II- XII grossly intact.  4/5 strength in RUE and RLE.  5/5 strength in LUE and LLE.   Decreased sensation in Rt arm and Rt leg as compared to Left.  Assessment/Plan:  Principal Problem:   Acute CVA (cerebrovascular accident) [facility]) Active Problems:   Hypertension   Substance use disorder Thyroid nodule Cocaine use disorder Tobacco use disorder  Charles Callahan is a 50 year old gentleman with medical history significant for poorly controlled hypertension, GERD, cocaine use disorder (last use 1 month ago), and tobacco disorder here for management of acute CVA.  Acute Scattered cortical and subcortical Non hemorrhagic  CVA Pt's weakness is getting better. He was evaluated by Rehab coordinator yesterday however no beds available today. Pt will talk to 44. Pt on Plavix 75 mg and ASA 325 mg. Pressures overnight are elevated with systolic between Artist and diastolic between 093-267 mmHg.  BP in the morning 178/95 mmHg.  - Appreciate Neurology's recommendations - Continue ASA 325mg  plus Plavix 75 mg daily for 3 months then stand alone ASA.  - Lipitor 80mg  daily  --Increase Amlodipine to 10 mg Daily (BP goal 130-150 mmHg because of stenosis) --Start lisinopril 2.5 mg Daily.   Thyroid Nodule Incidental finding on CT Angio. U/S Showing a 3.7 cm (Max), 3.5 x3.4 hypoechoic, lobulated nodule. TSH wnl. PTH normal at 63.  - Outpatient percutaneous sampling.    History of Uncontrolled  Hypertention BP running high. BP in the morning- 178/95 mmHg.  -IV Hydralazine 10 mg PRN  (BP Goal systolic 130-150 mmHg).  -Increase Amlodipine to 10 mg daily. -Start Lisinopril 2.5 mg daily.   Mild Hypercalcemia Calcium 11.7. Vitamin D low at 15.09, PTH  Normal at 63. Calcitriol normal at 63.6.  -Order PTH r peptide.   Prior to Admission Living Arrangement: Home Anticipated Discharge Location : Possibly CIR Barriers to Discharge: Waiting for CIR placement.   [id number], MD 03/04/2021, 2:42 PM Pager: [phone] After 5pm on weekdays and 1pm on weekends: On Call pager [phone]

## 2021-03-05 ENCOUNTER — Inpatient Hospital Stay (HOSPITAL_COMMUNITY)
Admission: RE | Admit: 2021-03-05 | Discharge: 2021-03-14 | DRG: 057 | Disposition: A | Discharge status: Home / Self Care | Payer: Medicaid Other | Source: Intra-hospital | Attending: Physical Medicine and Rehabilitation | Admitting: Physical Medicine and Rehabilitation

## 2021-03-05 ENCOUNTER — Other Ambulatory Visit: Payer: Self-pay

## 2021-03-05 DIAGNOSIS — I5032 Chronic diastolic (congestive) heart failure: Secondary | ICD-10-CM | POA: Diagnosis present

## 2021-03-05 DIAGNOSIS — E041 Nontoxic single thyroid nodule: Secondary | ICD-10-CM | POA: Diagnosis present

## 2021-03-05 DIAGNOSIS — I69351 Hemiplegia and hemiparesis following cerebral infarction affecting right dominant side: Principal | ICD-10-CM

## 2021-03-05 DIAGNOSIS — I110 Hypertensive heart disease with heart failure: Secondary | ICD-10-CM | POA: Diagnosis present

## 2021-03-05 DIAGNOSIS — Z823 Family history of stroke: Secondary | ICD-10-CM | POA: Diagnosis not present

## 2021-03-05 DIAGNOSIS — Z23 Encounter for immunization: Secondary | ICD-10-CM | POA: Diagnosis not present

## 2021-03-05 DIAGNOSIS — E8809 Other disorders of plasma-protein metabolism, not elsewhere classified: Secondary | ICD-10-CM | POA: Diagnosis present

## 2021-03-05 DIAGNOSIS — I10 Essential (primary) hypertension: Secondary | ICD-10-CM

## 2021-03-05 DIAGNOSIS — Z79899 Other long term (current) drug therapy: Secondary | ICD-10-CM | POA: Diagnosis not present

## 2021-03-05 DIAGNOSIS — E785 Hyperlipidemia, unspecified: Secondary | ICD-10-CM

## 2021-03-05 DIAGNOSIS — I639 Cerebral infarction, unspecified: Secondary | ICD-10-CM | POA: Diagnosis not present

## 2021-03-05 DIAGNOSIS — G8191 Hemiplegia, unspecified affecting right dominant side: Secondary | ICD-10-CM

## 2021-03-05 DIAGNOSIS — F17210 Nicotine dependence, cigarettes, uncomplicated: Secondary | ICD-10-CM | POA: Diagnosis present

## 2021-03-05 DIAGNOSIS — Z7982 Long term (current) use of aspirin: Secondary | ICD-10-CM

## 2021-03-05 DIAGNOSIS — E871 Hypo-osmolality and hyponatremia: Secondary | ICD-10-CM | POA: Diagnosis present

## 2021-03-05 DIAGNOSIS — R110 Nausea: Secondary | ICD-10-CM

## 2021-03-05 DIAGNOSIS — E46 Unspecified protein-calorie malnutrition: Secondary | ICD-10-CM | POA: Diagnosis present

## 2021-03-05 LAB — BASIC METABOLIC PANEL
Anion gap: 4 — ABNORMAL LOW (ref 5–15)
BUN: 16 mg/dL (ref 6–20)
CO2: 26 mmol/L (ref 22–32)
Calcium: 11.4 mg/dL — ABNORMAL HIGH (ref 8.9–10.3)
Chloride: 103 mmol/L (ref 98–111)
Creatinine, Ser: 1 mg/dL (ref 0.61–1.24)
GFR, Estimated: >60 mL/min (ref >60)
Glucose, Bld: 100 mg/dL — ABNORMAL HIGH (ref 70–99)
Potassium: 4.1 mmol/L (ref 3.5–5.1)
Sodium: 133 mmol/L — ABNORMAL LOW (ref 135–145)

## 2021-03-05 LAB — CBC
HCT: 44.8 % (ref 39.0–52.0)
Hemoglobin: 14.9 g/dL (ref 13.0–17.0)
MCH: 30.9 pg (ref 26.0–34.0)
MCHC: 33.3 g/dL (ref 30.0–36.0)
MCV: 92.9 fL (ref 80.0–100.0)
Platelets: 241 10*3/uL (ref 150–400)
RBC: 4.82 MIL/uL (ref 4.22–5.81)
RDW: 13.4 % (ref 11.5–15.5)
WBC: 4.9 10*3/uL (ref 4.0–10.5)
nRBC: 0 % (ref 0.0–0.2)

## 2021-03-05 MED ORDER — NICOTINE 14 MG/24HR TD PT24
14.0000 mg | MEDICATED_PATCH | Freq: Every day | TRANSDERMAL | Status: DC
  Administered 2021-03-05 – 2021-03-13 (×9): 14 mg via TRANSDERMAL
  Filled 2021-03-05 (×9): qty 1

## 2021-03-05 MED ORDER — ACETAMINOPHEN 650 MG RE SUPP: 650.0000 mg | RECTAL | Status: DC | PRN

## 2021-03-05 MED ORDER — ASPIRIN EC 325 MG PO TBEC
325.0000 mg | DELAYED_RELEASE_TABLET | Freq: Every day | ORAL | Status: DC
  Administered 2021-03-06 – 2021-03-14 (×9): 325 mg via ORAL
  Filled 2021-03-05 (×10): qty 1

## 2021-03-05 MED ORDER — PANTOPRAZOLE SODIUM 40 MG PO TBEC
40.0000 mg | DELAYED_RELEASE_TABLET | Freq: Every day | ORAL | Status: DC
  Administered 2021-03-06 – 2021-03-14 (×9): 40 mg via ORAL
  Filled 2021-03-05 (×9): qty 1

## 2021-03-05 MED ORDER — ENOXAPARIN SODIUM 40 MG/0.4ML IJ SOSY: 40.0000 mg | PREFILLED_SYRINGE | INTRAMUSCULAR | Status: DC

## 2021-03-05 MED ORDER — VITAMIN D 25 MCG (1000 UNIT) PO TABS
1000.0000 [IU] | ORAL_TABLET | Freq: Every day | ORAL | Status: DC
  Administered 2021-03-05: 1000 [IU] via ORAL
  Filled 2021-03-05: qty 1

## 2021-03-05 MED ORDER — LISINOPRIL 5 MG PO TABS
2.5000 mg | ORAL_TABLET | Freq: Every day | ORAL | Status: DC
  Administered 2021-03-06: 2.5 mg via ORAL
  Filled 2021-03-05: qty 1

## 2021-03-05 MED ORDER — SENNOSIDES-DOCUSATE SODIUM 8.6-50 MG PO TABS: 1.0000 | ORAL_TABLET | Freq: Every evening | ORAL | Status: DC | PRN

## 2021-03-05 MED ORDER — CLOPIDOGREL BISULFATE 75 MG PO TABS
75.0000 mg | ORAL_TABLET | Freq: Every day | ORAL | Status: DC
  Administered 2021-03-06 – 2021-03-14 (×9): 75 mg via ORAL
  Filled 2021-03-05 (×9): qty 1

## 2021-03-05 MED ORDER — ACETAMINOPHEN 160 MG/5ML PO SOLN: 650.0000 mg | ORAL | Status: DC | PRN

## 2021-03-05 MED ORDER — ATORVASTATIN CALCIUM 80 MG PO TABS
80.0000 mg | ORAL_TABLET | Freq: Every day | ORAL | Status: DC
  Administered 2021-03-06 – 2021-03-14 (×9): 80 mg via ORAL
  Filled 2021-03-05 (×9): qty 1

## 2021-03-05 MED ORDER — AMLODIPINE BESYLATE 10 MG PO TABS
10.0000 mg | ORAL_TABLET | Freq: Every day | ORAL | Status: DC
  Administered 2021-03-06 – 2021-03-14 (×9): 10 mg via ORAL
  Filled 2021-03-05 (×9): qty 1

## 2021-03-05 MED ORDER — ACETAMINOPHEN 325 MG PO TABS
650.0000 mg | ORAL_TABLET | ORAL | Status: DC | PRN
  Administered 2021-03-06 – 2021-03-10 (×2): 650 mg via ORAL
  Filled 2021-03-05 (×2): qty 2

## 2021-03-05 MED ORDER — ENOXAPARIN SODIUM 40 MG/0.4ML IJ SOSY
40.0000 mg | PREFILLED_SYRINGE | INTRAMUSCULAR | Status: DC
  Administered 2021-03-05 – 2021-03-10 (×6): 40 mg via SUBCUTANEOUS
  Filled 2021-03-05 (×6): qty 0.4

## 2021-03-05 MED ORDER — LIDOCAINE 5 % EX PTCH
1.0000 | MEDICATED_PATCH | CUTANEOUS | Status: DC
  Administered 2021-03-06 – 2021-03-13 (×8): 1 via TRANSDERMAL
  Filled 2021-03-05 (×8): qty 1

## 2021-03-05 MED ORDER — PNEUMOCOCCAL VAC POLYVALENT 25 MCG/0.5ML IJ INJ
0.5000 mL | INJECTION | INTRAMUSCULAR | Status: AC
  Administered 2021-03-06: 0.5 mL via INTRAMUSCULAR
  Filled 2021-03-05: qty 0.5

## 2021-03-05 MED ORDER — VITAMIN D 25 MCG (1000 UNIT) PO TABS
1000.0000 [IU] | ORAL_TABLET | Freq: Every day | ORAL | Status: DC
  Administered 2021-03-06 – 2021-03-14 (×9): 1000 [IU] via ORAL
  Filled 2021-03-05 (×9): qty 1

## 2021-03-05 NOTE — Progress Notes (Signed)
Inpatient Rehabilitation  Patient information reviewed and entered into eRehab system by Amiri Riechers M. Krisy Dix, M.A., CCC/SLP, PPS Coordinator.  Information including medical coding, functional ability and quality indicators will be reviewed and updated through discharge.    

## 2021-03-05 NOTE — Progress Notes (Signed)
PMR Admission Coordinator Pre-Admission Assessment  Patient: Charles Callahan is an 49 y.o., male MRN: [id number] DOB: 05/10/1971 Height:   Weight:                Insurance Information HMO:     PPO:      PCP:      IPA:      80/20:      OTHER:  PRIMARY: PT UNINSURED      Policy#:       Subscriber:  CM Name:       Phone#:      Fax#:  Pre-Cert#:       Employer:  Benefits:  Phone #:      Name:  Eff. Date:      Deduct:       Out of Pocket Max:       Life Max:   CIR:       SNF:  Outpatient:      Co-Pay:  Home Health:       Co-Pay:  DME:      Co-Pay:  Providers:  SECONDARY:       Policy#:       Phone#:   Financial Counselor: Kenda Revels      Phone#: [phone]  The "Data Collection Information Summary" for patients in Inpatient Rehabilitation Facilities with attached "Privacy Act Statement-Health Care Records" was provided and verbally reviewed with: N/A  Emergency Contact Information Contact Information    Name Relation Home Work Mobile   Snellgrove,TAMMY Spouse   [phone]   Firman,COURNTEY Daughter   [phone]     Current Medical History  Patient Admitting Diagnosis: CVA  History of Present Illness:  Charles Callahan is a 49-year-old right-handed male history of diastolic congestive heart failure as well as hypertension and tobacco use. Presented 02/27/2021 with acute onset of right side weakness.  Cranial CT scan showed no acute findings.  Area of low density in the cerebral hemisphere white matter consistent with small vessel ischemic change.  Patient did not receive tPA.  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  No associated hemorrhage or mass-effect.  CT angiogram of the head and neck showed no hemodynamically significant stenosis of the neck however there was a 3-4 cm mass abutting or arising from the left thyroid lobe.  Ultrasound of thyroid showed a solitary 3.7 cm right side thyroid nodule recommending percutaneous  sampling which could be done as outpatient.  Echocardiogram with ejection fraction of 55 to 60% no wall motion abnormalities grade 2 diastolic dysfunction.  Admission chemistries unremarkable except sodium 133 urine drug screen negative alcohol negative urinalysis negative nitrite.  Currently maintained on aspirin 325 mg daily and Plavix 75 mg daily for CVA prophylaxis x3 months then aspirin alone.  Subcutaneous Lovenox for DVT prophylaxis.  Tolerating a regular diet.  Therapy evaluations completed due to patient's right side weakness was recommended for a comprehensive rehab program.    Complete NIHSS TOTAL: 5    Past Medical History  Past Medical History:  Diagnosis Date  . CHF (congestive heart failure) (HCC)   . Hypertension     Family History  family history includes Diabetes in his mother; Hypertension in his mother.  Prior Rehab/Hospitalizations:  Has the patient had prior rehab or hospitalizations prior to admission? No  Has the patient had major surgery during 100 days prior to admission? No  Current Medications   Current Facility-Administered Medications:  .   stroke: mapping our   early stages of recovery book, , Does not apply, Once, Agyei, Obed K, MD .  acetaminophen (TYLENOL) tablet 650 mg, 650 mg, Oral, Q4H PRN, 650 mg at 03/05/21 0912 **OR** acetaminophen (TYLENOL) 160 MG/5ML solution 650 mg, 650 mg, Per Tube, Q4H PRN **OR** acetaminophen (TYLENOL) suppository 650 mg, 650 mg, Rectal, Q4H PRN, Agyei, Obed K, MD .  amLODipine (NORVASC) tablet 10 mg, 10 mg, Oral, Daily, Doda, Vandana, MD, 10 mg at 03/05/21 0913 .  aspirin EC tablet 325 mg, 325 mg, Oral, Daily, Xu, Jindong, MD, 325 mg at 03/05/21 0912 .  atorvastatin (LIPITOR) tablet 80 mg, 80 mg, Oral, Daily, Agyei, Obed K, MD, 80 mg at 03/05/21 0912 .  cholecalciferol (VITAMIN D3) tablet 1,000 Units, 1,000 Units, Oral, Daily, Doda, Vandana, MD, 1,000 Units at 03/05/21 1126 .  clopidogrel (PLAVIX) tablet 75 mg, 75 mg, Oral,  Daily, Khaliqdina, Salman, MD, 75 mg at 03/05/21 0912 .  enoxaparin (LOVENOX) injection 40 mg, 40 mg, Subcutaneous, Q24H, Agyei, Obed K, MD, 40 mg at 03/04/21 2203 .  hydrALAZINE (APRESOLINE) tablet 10 mg, 10 mg, Oral, Q6H PRN, Agyei, Obed K, MD .  lidocaine (LIDODERM) 5 % 1 patch, 1 patch, Transdermal, Q24H, Raulkar, Krutika P, MD, 1 patch at 03/04/21 1515 .  lisinopril (ZESTRIL) tablet 2.5 mg, 2.5 mg, Oral, Daily, Doda, Vandana, MD, 2.5 mg at 03/05/21 0912 .  nicotine (NICODERM CQ - dosed in mg/24 hours) patch 14 mg, 14 mg, Transdermal, Daily, Agyei, Obed K, MD, 14 mg at 03/04/21 2204 .  pantoprazole (PROTONIX) EC tablet 40 mg, 40 mg, Oral, Daily, Agyei, Obed K, MD, 40 mg at 03/05/21 0912 .  senna-docusate (Senokot-S) tablet 1 tablet, 1 tablet, Oral, QHS PRN, Agyei, Obed K, MD  Patients Current Diet:  Diet Order            Diet Heart Room service appropriate? Yes; Fluid consistency: Thin  Diet effective now                 Precautions / Restrictions Precautions Precautions: Fall Restrictions Weight Bearing Restrictions: No   Has the patient had 2 or more falls or a fall with injury in the past year?No  Prior Activity Level Community (5-7x/wk): driving, gets out of house daily  Prior Functional Level Prior Function Level of Independence: Independent  Self Care: Did the patient need help bathing, dressing, using the toilet or eating?  Independent  Indoor Mobility: Did the patient need assistance with walking from room to room (with or without device)? Independent  Stairs: Did the patient need assistance with internal or external stairs (with or without device)? Independent  Functional Cognition: Did the patient need help planning regular tasks such as shopping or remembering to take medications? Independent  Home Assistive Devices / Equipment Home Equipment: None  Prior Device Use: Indicate devices/aids used by the patient prior to current illness, exacerbation or  injury? None of the above  Current Functional Level Cognition  Overall Cognitive Status: Within Functional Limits for tasks assessed Orientation Level: Oriented X4 General Comments: Very motivated    Extremity Assessment (includes Sensation/Coordination)  Upper Extremity Assessment: RUE deficits/detail RUE Deficits / Details: Able to performing digit flexion/extension; finger opposition, wrist flexion/extension, and elbow flexion/extension. Difficulty with shoulder forward flexion. noting compensatory hiking. RUE Sensation: decreased light touch RUE Coordination: decreased fine motor,decreased gross motor  Lower Extremity Assessment: Defer to PT evaluation RLE Deficits / Details: Grossly 1/5 in ankle DF, and 2/5 throughout rest of LE. Was able to feel touch,   however, reports it is less than normal.    ADLs  Overall ADL's : Needs assistance/impaired Eating/Feeding: Minimal assistance,With adaptive utensils,Sitting Eating/Feeding Details (indicate cue type and reason): Focusing first half of session on eating breakfast. Pt using built up handle to increase success with grasp fork. Pt presenting with increased in hand manipulation. However, requiring cues to problem solve how to sequence in hand manipulation. As sesssion progress, pt becoming more confident with sequencing and required cues to initate in hand manipulation. Min A for support at elbow to optimzie bringing fork to mouth as pt with tednecy to lean forward to fork Grooming: Wash/dry hands,Wash/dry face,Oral care,Applying deodorant,Supervision/safety,Sitting Upper Body Bathing: Supervision/ safety,Sitting Lower Body Bathing: Min guard,Sit to/from stand Upper Body Dressing : Supervision/safety,Sitting Lower Body Dressing: Min guard,Sitting/lateral leans Functional mobility during ADLs: Minimal assistance,Rolling walker General ADL Comments: Pt performing sit<>stand, self feeding, and UE exercises.    Mobility  Overal bed  mobility: Needs Assistance Bed Mobility: Supine to Sit Supine to sit: Supervision Sit to supine: Min guard General bed mobility comments: Use of rail to get to EOB, encouraged use of RUE to assist with functional transfers.    Transfers  Overall transfer level: Needs assistance Equipment used: None Transfers: Sit to/from Stand Sit to Stand: Min guard Stand pivot transfers: Min guard General transfer comment: Cues to push through BUEs and place equal weight through BLEs during transition, as well as anterior translation to stand from EOB x1, from chair x3. Pt favors WB through LLE. Min A needed. SPT bed to chair with Min guard assist.    Ambulation / Gait / Stairs / Wheelchair Mobility  Ambulation/Gait Ambulation/Gait assistance: Min assist Gait Distance (Feet): 16 Feet (x2 bouts) Assistive device:  (rail in hallway) Gait Pattern/deviations: Step-through pattern,Decreased dorsiflexion - right,Decreased stride length,Decreased step length - left,Decreased weight shift to right,Narrow base of support General Gait Details: Use of rail on LUE in hallway; assist needed to increase hip/knee flexion during swing phase on right as well as stabilize right knee during stance phase and weight shift towards right side to advance LLE. Decreased DF RLE during swing. 1 seated rest break. Gait velocity: decreased Gait velocity interpretation: <1.31 ft/sec, indicative of household ambulator    Posture / Balance Dynamic Sitting Balance Sitting balance - Comments: no balance issues with unsupported sitting Balance Overall balance assessment: Needs assistance Sitting-balance support: Feet supported,No upper extremity supported Sitting balance-Leahy Scale: Good Sitting balance - Comments: no balance issues with unsupported sitting Standing balance support: Bilateral upper extremity supported Standing balance-Leahy Scale: Poor Standing balance comment: using RW for in room mobility    Special needs/care  consideration Designated visitor Courtney Salsberry, daughter     Previous Home Environment (from acute therapy documentation) Living Arrangements: Parent  Lives With: Family Available Help at Discharge: Family,Available PRN/intermittently Type of Home: House Home Layout: One level Home Access: Level entry Bathroom Shower/Tub: Tub/shower unit Bathroom Toilet: Standard Bathroom Accessibility: Yes How Accessible: Accessible via walker Home Care Services: No  Discharge Living Setting Plans for Discharge Living Setting: Patient's home Type of Home at Discharge: House Discharge Home Layout: One level Discharge Home Access: Level entry Discharge Bathroom Shower/Tub: Tub/shower unit Discharge Bathroom Toilet: Standard Discharge Bathroom Accessibility: Yes How Accessible: Accessible via walker Does the patient have any problems obtaining your medications?: Yes (Describe) (uninsured)  Social/Family/Support Systems Anticipated Caregiver: Courtney Molla, daughter Anticipated Caregiver's Contact Information: [phone] Caregiver Availability: Intermittent Discharge Plan Discussed with Primary Caregiver: Yes Is Caregiver In Agreement with Plan?: Yes Does   Caregiver/Family have Issues with Lodging/Transportation while Pt is in Rehab?: No   Goals Patient/Family Goal for Rehab:  PT/OT mod I/Supervision, SLP indep Expected length of stay: 7-10 days Pt/Family Agrees to Admission and willing to participate: Yes Program Orientation Provided & Reviewed with Pt/Caregiver Including Roles  & Responsibilities: Yes  Barriers to Discharge: Decreased caregiver support   Decrease burden of Care through IP rehab admission: NA   Possible need for SNF placement upon discharge:Not anticipated   Patient Condition: This patient's condition remains as documented in the consult dated 03/03/21, in which the Rehabilitation Physician determined and documented that the patient's condition is appropriate for  intensive rehabilitative care in an inpatient rehabilitation facility. Will admit to inpatient rehab today.  Preadmission Screen Completed By: Lauren Graves Madden, CCC-SLP, with updates day of admission by  Araiyah Cumpton E Kazimir Hartnett, PT, 03/05/2021 1:09 PM ______________________________________________________________________   Discussed status with Dr. Patel on 03/05/21 at 1:09 PM  and received approval for admission today.  Admission Coordinator:  Tekeyah Santiago E Orlean Holtrop, PT, DPT 1:09 PM /Date 03/05/21     

## 2021-03-05 NOTE — Progress Notes (Signed)
Pt admitted to room 5C08. Denies pain or discomfort. Oriented to floor, call bell and rehab policy. No further questions at this time.   Marylu Lund, RN

## 2021-03-05 NOTE — Progress Notes (Signed)
Physical Medicine and Rehabilitation Consult Reason for Consult: Right side weakness Referring Physician: Dr. Charissa Bash     HPI: Charles Callahan is a 50 y.o. right-handed male with history of diastolic congestive heart failure as well as hypertension and tobacco use.  Per chart review patient lives with parent.  Independent prior to admission.  1 level home with level entry.  Presented 02/27/2021 with acute onset of right side weakness.  Cranial CT scan no acute findings.  Area of low density in the cerebral hemisphere white matter consistent with small vessel ischemic change.  Patient did not receive tPA.  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  No associated hemorrhage or mass-effect.  CT angiogram of the head/neck showed no hemodynamically significant stenosis of the neck however there was a 3-4 cm mass abutting or arising from the left thyroid lobe.  Ultrasound of the thyroid showed a solitary 3.7 m right side thyroid nodule recommending percutaneous sampling.  Echocardiogram with ejection fraction of 55 to 60% no wall motion abnormalities grade 2 diastolic dysfunction.  Admission chemistries unremarkable sodium 133, urine drug screen negative, alcohol negative, urinalysis negative nitrite.  Currently on aspirin 325 mg daily and Plavix 75 mg daily for CVA prophylaxis x3 months then aspirin alone.  Subcutaneous Lovenox for DVT prophylaxis.  Tolerating a regular diet.  Therapy evaluations completed due to patient's right side weakness recommendations of physical medicine rehab consult. He is interested in CIR.      Review of Systems  Constitutional: Negative for chills and fever.  HENT: Negative for hearing loss.   Eyes: Negative for blurred vision and double vision.  Respiratory: Negative for cough and shortness of breath.   Cardiovascular: Negative for chest pain, palpitations and leg swelling.  Gastrointestinal:  Positive for constipation. Negative for heartburn, nausea and vomiting.  Genitourinary: Negative for dysuria, flank pain and hematuria.  Musculoskeletal: Positive for myalgias.  Skin: Negative for rash.  Neurological: Positive for weakness.       Occasional headaches  All other systems reviewed and are negative.       Past Medical History:  Diagnosis Date  . CHF (congestive heart failure) (HCC)    . Hypertension           Past Surgical History:  Procedure Laterality Date  . EYE SURGERY             Family History  Problem Relation Age of Onset  . Diabetes Mother    . Hypertension Mother      Social History:  reports that he has been smoking cigarettes. He has been smoking about 1.00 pack per day. He has never used smokeless tobacco. He reports current alcohol use. He reports current drug use. Drug: Cocaine. Allergies:      Allergies  Allergen Reactions  . Morphine And Related Nausea And Vomiting          Medications Prior to Admission  Medication Sig Dispense Refill  . albuterol (PROVENTIL HFA;VENTOLIN HFA) 108 (90 BASE) MCG/ACT inhaler Inhale 1-2 puffs into the lungs every 6 (six) hours as needed for wheezing. (Patient not taking: Reported on 02/28/2021) 1 Inhaler 0  . cetirizine (ZYRTEC) 10 MG tablet Take 1 tablet (10 mg total) by mouth daily. (Patient not taking: Reported on 02/28/2021) 30 tablet 11  . dicyclomine (BENTYL) 20 MG tablet Take 1 tablet (20 mg total) by mouth 2 (two) times daily. (Patient not taking: No  sig reported) 20 tablet 0  . HYDROcodone-homatropine (HYCODAN) 5-1.5 MG/5ML syrup Take 5 mLs by mouth every 6 (six) hours as needed for cough. (Patient not taking: Reported on 02/28/2021) 75 mL 0  . ibuprofen (ADVIL,MOTRIN) 600 MG tablet Take 1 tablet (600 mg total) by mouth every 8 (eight) hours as needed for pain or fever. (Patient not taking: Reported on 02/28/2021) 20 tablet 0  . lisinopril (ZESTRIL) 20 MG tablet Take 1 tablet (20 mg total) by mouth daily.  (Patient not taking: No sig reported) 30 tablet 1  . omeprazole (PRILOSEC) 20 MG capsule Take 1 capsule (20 mg total) by mouth daily. (Patient not taking: Reported on 02/28/2021) 30 capsule 3  . ondansetron (ZOFRAN) 4 MG tablet Take 1 tablet (4 mg total) by mouth every 6 (six) hours. (Patient not taking: Reported on 02/28/2021) 12 tablet 0  . ondansetron (ZOFRAN) 4 MG tablet Take 1 tablet (4 mg total) by mouth every 6 (six) hours. (Patient not taking: No sig reported) 12 tablet 0  . permethrin (ELIMITE) 5 % cream Apply to affected area once (Patient not taking: No sig reported) 60 g 0  . sucralfate (CARAFATE) 1 GM/10ML suspension Take 10 mLs (1 g total) by mouth 4 (four) times daily -  with meals and at bedtime. (Patient not taking: No sig reported) 420 mL 0  . tobramycin (TOBREX) 0.3 % ophthalmic solution Place 1 drop into the right eye every 6 (six) hours. (Patient not taking: No sig reported) 5 mL 0      Home: Home Living Family/patient expects to be discharged to:: Private residence Living Arrangements: Parent Available Help at Discharge: Family,Available PRN/intermittently Type of Home: House Home Access: Level entry Home Layout: One level Bathroom Shower/Tub: Engineer, manufacturing systems: Standard Home Equipment: None  Lives With: Alone  Functional History: Prior Function Level of Independence: Independent Functional Status:  Mobility: Bed Mobility Overal bed mobility: Needs Assistance Bed Mobility: Supine to Sit Supine to sit: Min assist Sit to supine: Min guard General bed mobility comments: OOB in chair before and after session Transfers Overall transfer level: Needs assistance Equipment used: Rolling walker (2 wheeled) Transfers: Sit to/from Stand Sit to Stand: Min guard Stand pivot transfers: Min guard General transfer comment: Tactile cues and faiclitating of weight bearing through RUE when pushing up into standing. Min Guard A for  safety Ambulation/Gait Ambulation/Gait assistance: Editor, commissioning (Feet): 25 Feet Assistive device: Rolling walker (2 wheeled) Gait Pattern/deviations: Step-to pattern,Step-through pattern,Decreased stance time - right,Decreased stride length,Decreased dorsiflexion - right,Decreased weight shift to right,Decreased step length - left,Narrow base of support General Gait Details: with use of RW- cues/facilitation for upright posture with decreased trunk rotation, anterior translation of hip over right LE in stance and cues/assist for hip/knee flexion with swing phase of gait. pt continues to have decreased hip/knee flexion and decreased DF with foot sliding on floor at times vs picking foot up. Gait velocity: slow Gait velocity interpretation: <1.31 ft/sec, indicative of household ambulator   ADL: ADL Overall ADL's : Needs assistance/impaired Eating/Feeding: Minimal assistance,With adaptive utensils,Sitting Eating/Feeding Details (indicate cue type and reason): Focusing first half of session on eating breakfast. Pt using built up handle to increase success with grasp fork. Pt presenting with increased in hand manipulation. However, requiring cues to problem solve how to sequence in hand manipulation. As sesssion progress, pt becoming more confident with sequencing and required cues to initate in hand manipulation. Min A for support at elbow to optimzie bringing fork to  mouth as pt with tednecy to lean forward to fork Upper Body Dressing : Minimal assistance,Sitting Lower Body Dressing: Minimal assistance,Sit to/from stand Functional mobility during ADLs: Minimal assistance General ADL Comments: Pt performing sit<>stand, self feeding, and UE exercises.   Cognition: Cognition Overall Cognitive Status: Within Functional Limits for tasks assessed Orientation Level: Oriented X4 Cognition Arousal/Alertness: Awake/alert Behavior During Therapy: WFL for tasks assessed/performed Overall  Cognitive Status: Within Functional Limits for tasks assessed General Comments: Very motivated   Blood pressure (!) 152/88, pulse 80, temperature 98.1 F (36.7 C), temperature source Oral, resp. rate 18, SpO2 100 %. Physical Exam Gen: no distress, normal appearing HEENT: oral mucosa pink and moist, NCAT Cardio: Reg rate Chest: normal effort, normal rate of breathing Abd: soft, non-distended Ext: no edema Psych: pleasant, normal affect Skin: intact Neurological:     Comments: Patient is alert NAD/follow commands.  Makes eye contact with examiner.Oriented x3. 2/5 R EE and EF, 3/5 WE and hand grip. 5/5 strength LUE. 3/5 RLE except for 1/5 DF, 5/5 LLE.    Lab Results Last 24 Hours       Results for orders placed or performed during the hospital encounter of 02/27/21 (from the past 24 hour(s))  VITAMIN D 25 Hydroxy (Vit-D Deficiency, Fractures)     Status: Abnormal    Collection Time: 03/03/21  4:22 AM  Result Value Ref Range    Vit D, 25-Hydroxy 15.09 (L) 30 - 100 ng/mL  Comprehensive metabolic panel     Status: Abnormal    Collection Time: 03/03/21  4:22 AM  Result Value Ref Range    Sodium 134 (L) 135 - 145 mmol/L    Potassium 4.3 3.5 - 5.1 mmol/L    Chloride 102 98 - 111 mmol/L    CO2 28 22 - 32 mmol/L    Glucose, Bld 107 (H) 70 - 99 mg/dL    BUN 10 6 - 20 mg/dL    Creatinine, Ser 4.09 0.61 - 1.24 mg/dL    Calcium 81.1 (H) 8.9 - 10.3 mg/dL    Total Protein 6.3 (L) 6.5 - 8.1 g/dL    Albumin 3.3 (L) 3.5 - 5.0 g/dL    AST 14 (L) 15 - 41 U/L    ALT 19 0 - 44 U/L    Alkaline Phosphatase 76 38 - 126 U/L    Total Bilirubin 0.7 0.3 - 1.2 mg/dL    GFR, Estimated >91 >47 mL/min    Anion gap 4 (L) 5 - 15       Imaging Results (Last 48 hours)  ECHOCARDIOGRAM COMPLETE BUBBLE STUDY   Result Date: 03/01/2021    ECHOCARDIOGRAM REPORT   Patient Name:   Charles Callahan Date of Exam: 03/01/2021 Medical Rec #:  [id number]    Height:       66.5 in Accession #:    [id number]   Weight:        225.0 lb Date of Birth:  1970-12-10    BSA:          2.114 m Patient Age:    49 years     BP:           187/109 mmHg Patient Gender: M            HR:           62 bpm. [facility] Procedure: 2D Echo, Cardiac Doppler and Color Doppler Indications:    Stroke l63.9  History:  Patient has prior history of Echocardiogram examinations, most                 recent 04/11/2009. CHF; Risk Factors:Hypertension. Substance use                 disorder.  Sonographer:    Celesta GentileBernard White RCS Referring Phys: [phone] WHITNEY PLUNKETT IMPRESSIONS  1. Left ventricular ejection fraction, by estimation, is 55 to 60%. The left ventricle has normal function. The left ventricle has no regional wall motion abnormalities. There is moderate left ventricular hypertrophy. Left ventricular diastolic parameters are consistent with Grade II diastolic dysfunction (pseudonormalization).  2. Right ventricular systolic function is normal. The right ventricular size is normal. Tricuspid regurgitation signal is inadequate for assessing PA pressure.  3. Left atrial size was moderately dilated.  4. There is a trivial pericardial effusion posterior to the left ventricle.  5. The mitral valve is grossly normal. Mild mitral valve regurgitation.  6. The aortic valve is tricuspid. Aortic valve regurgitation is not visualized.  7. The inferior vena cava is normal in size with greater than 50% respiratory variability, suggesting right atrial pressure of 3 mmHg. FINDINGS  Left Ventricle: Left ventricular ejection fraction, by estimation, is 55 to 60%. The left ventricle has normal function. The left ventricle has no regional wall motion abnormalities. The left ventricular internal cavity size was normal in size. There is  moderate left ventricular hypertrophy. Left ventricular diastolic parameters are consistent with Grade II diastolic dysfunction (pseudonormalization). Right Ventricle: The right ventricular size is normal. No increase in right  ventricular wall thickness. Right ventricular systolic function is normal. Tricuspid regurgitation signal is inadequate for assessing PA pressure. Left Atrium: Left atrial size was moderately dilated. Right Atrium: Right atrial size was normal in size. Pericardium: Trivial pericardial effusion is present. The pericardial effusion is posterior to the left ventricle. Mitral Valve: The mitral valve is grossly normal. Mild mitral valve regurgitation. Tricuspid Valve: The tricuspid valve is grossly normal. Tricuspid valve regurgitation is trivial. Aortic Valve: The aortic valve is tricuspid. There is mild to moderate aortic valve annular calcification. Aortic valve regurgitation is not visualized. Pulmonic Valve: The pulmonic valve was grossly normal. Pulmonic valve regurgitation is trivial. Aorta: The aortic root is normal in size and structure. Venous: The inferior vena cava is normal in size with greater than 50% respiratory variability, suggesting right atrial pressure of 3 mmHg. IAS/Shunts: No atrial level shunt detected by color flow Doppler.  LEFT VENTRICLE PLAX 2D LVIDd:         5.20 cm  Diastology LVIDs:         3.60 cm  LV e' medial:    4.68 cm/s LV PW:         1.20 cm  LV E/e' medial:  15.7 LV IVS:        1.40 cm  LV e' lateral:   5.77 cm/s LVOT diam:     2.10 cm  LV E/e' lateral: 12.8 LV SV:         56 LV SV Index:   27 LVOT Area:     3.46 cm  RIGHT VENTRICLE RV S prime:     10.90 cm/s TAPSE (M-mode): 2.0 cm LEFT ATRIUM             Index       RIGHT ATRIUM           Index LA diam:        4.40 cm 2.08 cm/m  RA  Area:     20.50 cm LA Vol (A2C):   77.5 ml 36.66 ml/m RA Volume:   65.10 ml  30.79 ml/m LA Vol (A4C):   93.2 ml 44.08 ml/m LA Biplane Vol: 90.5 ml 42.80 ml/m  AORTIC VALVE LVOT Vmax:   80.90 cm/s LVOT Vmean:  51.400 cm/s LVOT VTI:    0.162 m  AORTA Ao Root diam: 3.10 cm MITRAL VALVE MV Area (PHT): 4.80 cm    SHUNTS MV Decel Time: 158 msec    Systemic VTI:  0.16 m MV E velocity: 73.70 cm/s   Systemic Diam: 2.10 cm MV A velocity: 62.40 cm/s MV E/A ratio:  1.18 Nona Dell MD Electronically signed by Nona Dell MD Signature Date/Time: 03/01/2021/12:47:23 PM    Final        Assessment/Plan: Diagnosis: CVA 1. Does the need for close, 24 hr/day medical supervision in concert with the patient's rehab needs make it unreasonable for this patient to be served in a less intensive setting? Yes 2. Co-Morbidities requiring supervision/potential complications:  1. Right sided weakness 2. Right shoulder pain: lidocaine patch ordered 3. HTN: monitor TID 4. Substance abuse disorder: provide counseling 5. History of chest pain 3. Due to bladder management, bowel management, safety, skin/wound care, disease management, medication administration, pain management and patient education, does the patient require 24 hr/day rehab nursing? Yes 4. Does the patient require coordinated care of a physician, rehab nurse, therapy disciplines of PT, OT to address physical and functional deficits in the context of the above medical diagnosis(es)? Yes Addressing deficits in the following areas: balance, endurance, locomotion, strength, transferring, bowel/bladder control, bathing, dressing, feeding, grooming, toileting and psychosocial support 5. Can the patient actively participate in an intensive therapy program of at least 3 hrs of therapy per day at least 5 days per week? Yes 6. The potential for patient to make measurable gains while on inpatient rehab is excellent 7. Anticipated functional outcomes upon discharge from inpatient rehab are modified independent  with PT, modified independent with OT, independent with SLP. 8. Estimated rehab length of stay to reach the above functional goals is: 5-7 days 9. Anticipated discharge destination: Home 10. Overall Rehab/Functional Prognosis: excellent   RECOMMENDATIONS: This patient's condition is appropriate for continued rehabilitative care in the following  setting: CIR Patient has agreed to participate in recommended program. Yes Note that insurance prior authorization may be required for reimbursement for recommended care.   Comment: Thank you for this consult. Admission coordinator to follow.    I have personally performed a face to face diagnostic evaluation, including, but not limited to relevant history and physical exam findings, of this patient and developed relevant assessment and plan.  Additionally, I have reviewed and concur with the physician assistant's documentation above.   Sula Soda, MD   Mcarthur Rossetti Angiulli, PA-C 03/03/2021

## 2021-03-05 NOTE — H&P (Signed)
Physical Medicine and Rehabilitation Admission H&P    Chief Complaint  Patient presents with  . Weakness  : HPI: Charles Callahan is a 50 year old right-handed male history of diastolic congestive heart failure as well as hypertension and tobacco use.  Per chart review lives with parent.  Independent prior to admission.  1 level home with level entry.  History taken from chart review and patient.  He presented on 02/27/2021 with acute onset of right side hemiparesis.  Cranial CT scan unremarkable for acute intracranial process.  Area of low density in the cerebral hemisphere white matter consistent with small vessel ischemic change.  Patient did not receive tPA.  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  No associated hemorrhage or mass-effect.  CT angiogram of the head and neck showed no hemodynamically significant stenosis of the neck however there was a 3-4 cm mass abutting or arising from the left thyroid lobe.  Ultrasound of thyroid showed a solitary 3.7 cm right thyroid nodule recommending percutaneous sampling which could be done as outpatient.  Echocardiogram with ejection fraction 55 to 60%, no wall motion abnormalities grade 2 diastolic dysfunction.  Admission chemistries unremarkable except sodium 133, urine drug screen negative alcohol negative urinalysis negative nitrite.  Currently maintained on aspirin 325 mg daily and Plavix 75 mg daily for CVA prophylaxis x3 months then aspirin alone.  Subcutaneous Lovenox for DVT prophylaxis.  Tolerating a regular diet.  Therapy evaluations completed due to patient's right hemiparesis was admitted for a comprehensive rehab program. Please see preadmission assessment from earlier today as well.  Review of Systems  Constitutional: Negative for chills and fever.  HENT: Negative for hearing loss.   Eyes: Negative for blurred vision and double vision.  Respiratory: Negative for cough and  shortness of breath.   Cardiovascular: Negative for chest pain and palpitations.  Gastrointestinal: Positive for constipation. Negative for heartburn, nausea and vomiting.  Genitourinary: Negative for dysuria, flank pain and hematuria.  Musculoskeletal: Positive for myalgias.  Skin: Negative for rash.  Neurological: Positive for focal weakness and weakness.       Occasional headaches  All other systems reviewed and are negative.  Past Medical History:  Diagnosis Date  . CHF (congestive heart failure) (HCC)   . Hypertension    Past Surgical History:  Procedure Laterality Date  . EYE SURGERY     Family History  Problem Relation Age of Onset  . Diabetes Mother   . Hypertension Mother    Social History:  reports that he has been smoking cigarettes. He has been smoking about 1.00 pack per day. He has never used smokeless tobacco. He reports current alcohol use. He reports current drug use. Drug: Cocaine. Allergies:  Allergies  Allergen Reactions  . Morphine And Related Nausea And Vomiting   Medications Prior to Admission  Medication Sig Dispense Refill  . albuterol (PROVENTIL HFA;VENTOLIN HFA) 108 (90 BASE) MCG/ACT inhaler Inhale 1-2 puffs into the lungs every 6 (six) hours as needed for wheezing. (Patient not taking: Reported on 02/28/2021) 1 Inhaler 0  . cetirizine (ZYRTEC) 10 MG tablet Take 1 tablet (10 mg total) by mouth daily. (Patient not taking: Reported on 02/28/2021) 30 tablet 11  . dicyclomine (BENTYL) 20 MG tablet Take 1 tablet (20 mg total) by mouth 2 (two) times daily. (Patient not taking: No sig reported) 20 tablet 0  . HYDROcodone-homatropine (HYCODAN) 5-1.5 MG/5ML syrup Take 5 mLs by mouth every 6 (six) hours as needed for  cough. (Patient not taking: Reported on 02/28/2021) 75 mL 0  . ibuprofen (ADVIL,MOTRIN) 600 MG tablet Take 1 tablet (600 mg total) by mouth every 8 (eight) hours as needed for pain or fever. (Patient not taking: Reported on 02/28/2021) 20 tablet 0  .  lisinopril (ZESTRIL) 20 MG tablet Take 1 tablet (20 mg total) by mouth daily. (Patient not taking: No sig reported) 30 tablet 1  . omeprazole (PRILOSEC) 20 MG capsule Take 1 capsule (20 mg total) by mouth daily. (Patient not taking: Reported on 02/28/2021) 30 capsule 3  . ondansetron (ZOFRAN) 4 MG tablet Take 1 tablet (4 mg total) by mouth every 6 (six) hours. (Patient not taking: Reported on 02/28/2021) 12 tablet 0  . ondansetron (ZOFRAN) 4 MG tablet Take 1 tablet (4 mg total) by mouth every 6 (six) hours. (Patient not taking: No sig reported) 12 tablet 0  . permethrin (ELIMITE) 5 % cream Apply to affected area once (Patient not taking: No sig reported) 60 g 0  . sucralfate (CARAFATE) 1 GM/10ML suspension Take 10 mLs (1 g total) by mouth 4 (four) times daily -  with meals and at bedtime. (Patient not taking: No sig reported) 420 mL 0  . tobramycin (TOBREX) 0.3 % ophthalmic solution Place 1 drop into the right eye every 6 (six) hours. (Patient not taking: No sig reported) 5 mL 0    Drug Regimen Review Drug regimen was reviewed and remains appropriate with no significant issues identified  Home: Home Living Family/patient expects to be discharged to:: Private residence Living Arrangements: Parent Available Help at Discharge: Family,Available PRN/intermittently Type of Home: House Home Access: Level entry Home Layout: One level Bathroom Shower/Tub: Associate ProfessorTub/shower unit Bathroom Toilet: Standard Bathroom Accessibility: Yes Home Equipment: None  Lives With: Family   Functional History: Prior Function Level of Independence: Independent  Functional Status:  Mobility: Bed Mobility Overal bed mobility: Needs Assistance Bed Mobility: Supine to Sit Supine to sit: Min guard,HOB elevated Sit to supine: Min guard General bed mobility comments: Use of rail to get to EOB, encouraged use of RUE to assist with functional transfers. Transfers Overall transfer level: Needs assistance Equipment used:  None Transfers: Sit to/from Stand Sit to Stand: Min assist Stand pivot transfers: Min guard General transfer comment: Cues to push through BUEs and place equal weight through BLEs during transition, as well as anterior translation to stand from EOB x1, from chair x3. Pt favors WB through LLE. Min A needed. SPT bed to chair with Min guard assist. Ambulation/Gait Ambulation/Gait assistance: Min assist Gait Distance (Feet): 16 Feet (x2 bouts) Assistive device:  (rail in hallway) Gait Pattern/deviations: Step-through pattern,Decreased dorsiflexion - right,Decreased stride length,Decreased step length - left,Decreased weight shift to right,Narrow base of support General Gait Details: Use of rail on LUE in hallway; assist needed to increase hip/knee flexion during swing phase on right as well as stabilize right knee during stance phase and weight shift towards right side to advance LLE. Decreased DF RLE during swing. 1 seated rest break. Gait velocity: decreased Gait velocity interpretation: <1.31 ft/sec, indicative of household ambulator    ADL: ADL Overall ADL's : Needs assistance/impaired Eating/Feeding: Minimal assistance,With adaptive utensils,Sitting Eating/Feeding Details (indicate cue type and reason): Focusing first half of session on eating breakfast. Pt using built up handle to increase success with grasp fork. Pt presenting with increased in hand manipulation. However, requiring cues to problem solve how to sequence in hand manipulation. As sesssion progress, pt becoming more confident with sequencing and required cues  to initate in hand manipulation. Min A for support at elbow to optimzie bringing fork to mouth as pt with tednecy to lean forward to fork Upper Body Dressing : Minimal assistance,Sitting Lower Body Dressing: Minimal assistance,Sit to/from stand Functional mobility during ADLs: Minimal assistance General ADL Comments: Pt performing sit<>stand, self feeding, and UE  exercises.  Cognition: Cognition Overall Cognitive Status: Within Functional Limits for tasks assessed Orientation Level: Oriented X4 Cognition Arousal/Alertness: Awake/alert Behavior During Therapy: WFL for tasks assessed/performed Overall Cognitive Status: Within Functional Limits for tasks assessed General Comments: Very motivated  Physical Exam: Blood pressure (!) 161/93, pulse (!) 55, temperature 97.7 F (36.5 C), temperature source Oral, resp. rate 14, SpO2 99 %. Physical Exam Vitals reviewed.  Constitutional:      General: He is not in acute distress.    Appearance: Normal appearance. He is not ill-appearing.  HENT:     Head: Normocephalic and atraumatic.     Right Ear: External ear normal.     Left Ear: External ear normal.     Nose: Nose normal.  Eyes:     General:        Right eye: No discharge.        Left eye: No discharge.     Extraocular Movements: Extraocular movements intact.  Cardiovascular:     Rate and Rhythm: Normal rate and regular rhythm.  Pulmonary:     Effort: Pulmonary effort is normal. No respiratory distress.     Breath sounds: No stridor.  Abdominal:     General: Abdomen is flat. Bowel sounds are normal. There is no distension.  Musculoskeletal:     Cervical back: Normal range of motion and neck supple.     Comments: No edema or tenderness in extremities  Skin:    General: Skin is warm and dry.  Neurological:     Mental Status: He is alert.     Comments: Alert and oriented Makes eye contact with examiner.   Follows commands.   Fair insight and awareness. Motor: LUE/LE: 5/5 proximal distal RUE: Shoulder abduction 2+/5 distally 2-/5 RLE: 3-/5 HF, KE, 1/5 ADF  Psychiatric:        Mood and Affect: Mood normal.        Behavior: Behavior normal.     Results for orders placed or performed during the hospital encounter of 02/27/21 (from the past 48 hour(s))  Basic metabolic panel     Status: Abnormal   Collection Time: 03/04/21  4:28 AM   Result Value Ref Range   Sodium 133 (L) 135 - 145 mmol/L   Potassium 4.3 3.5 - 5.1 mmol/L   Chloride 103 98 - 111 mmol/L   CO2 27 22 - 32 mmol/L   Glucose, Bld 104 (H) 70 - 99 mg/dL    Comment: Glucose reference range applies only to samples taken after fasting for at least 8 hours.   BUN 15 6 - 20 mg/dL   Creatinine, Ser 7.06 0.61 - 1.24 mg/dL   Calcium 23.7 (H) 8.9 - 10.3 mg/dL   GFR, Estimated >62 >83 mL/min    Comment: (NOTE) Calculated using the CKD-EPI Creatinine Equation (2021)    Anion gap 3 (L) 5 - 15    Comment: Performed at [facility], [address], [place], [place] [address]  Basic metabolic panel     Status: Abnormal   Collection Time: 03/05/21  3:09 AM  Result Value Ref Range   Sodium 133 (L) 135 - 145 mmol/L  Potassium 4.1 3.5 - 5.1 mmol/L   Chloride 103 98 - 111 mmol/L   CO2 26 22 - 32 mmol/L   Glucose, Bld 100 (H) 70 - 99 mg/dL    Comment: Glucose reference range applies only to samples taken after fasting for at least 8 hours.   BUN 16 6 - 20 mg/dL   Creatinine, Ser 4.26 0.61 - 1.24 mg/dL   Calcium 83.4 (H) 8.9 - 10.3 mg/dL   GFR, Estimated >19 >62 mL/min    Comment: (NOTE) Calculated using the CKD-EPI Creatinine Equation (2021)    Anion gap 4 (L) 5 - 15    Comment: Performed at [facility], [address], [place], [place] [address]   ECHOCARDIOGRAM COMPLETE BUBBLE STUDY  Result Date: 03/01/2021    ECHOCARDIOGRAM REPORT   Patient Name:   STEVIE CHARTER Date of Exam: 03/01/2021 Medical Rec #:  [id number]    Height:       66.5 in Accession #:    [id number]   Weight:       225.0 lb Date of Birth:  10/30/1970    BSA:          2.114 m Patient Age:    49 years     BP:           187/109 mmHg Patient Gender: M            HR:           62 bpm. [facility] Procedure: 2D Echo, Cardiac Doppler and Color Doppler Indications:    Stroke l63.9  History:        Patient has prior history of Echocardiogram examinations, most                 recent  04/11/2009. CHF; Risk Factors:Hypertension. Substance use                 disorder.  Sonographer:    Celesta Gentile RCS Referring Phys: [phone] WHITNEY PLUNKETT IMPRESSIONS  1. Left ventricular ejection fraction, by estimation, is 55 to 60%. The left ventricle has normal function. The left ventricle has no regional wall motion abnormalities. There is moderate left ventricular hypertrophy. Left ventricular diastolic parameters are consistent with Grade II diastolic dysfunction (pseudonormalization).  2. Right ventricular systolic function is normal. The right ventricular size is normal. Tricuspid regurgitation signal is inadequate for assessing PA pressure.  3. Left atrial size was moderately dilated.  4. There is a trivial pericardial effusion posterior to the left ventricle.  5. The mitral valve is grossly normal. Mild mitral valve regurgitation.  6. The aortic valve is tricuspid. Aortic valve regurgitation is not visualized.  7. The inferior vena cava is normal in size with greater than 50% respiratory variability, suggesting right atrial pressure of 3 mmHg. FINDINGS  Left Ventricle: Left ventricular ejection fraction, by estimation, is 55 to 60%. The left ventricle has normal function. The left ventricle has no regional wall motion abnormalities. The left ventricular internal cavity size was normal in size. There is  moderate left ventricular hypertrophy. Left ventricular diastolic parameters are consistent with Grade II diastolic dysfunction (pseudonormalization). Right Ventricle: The right ventricular size is normal. No increase in right ventricular wall thickness. Right ventricular systolic function is normal. Tricuspid regurgitation signal is inadequate for assessing PA pressure. Left Atrium: Left atrial size was moderately dilated. Right Atrium: Right atrial size was normal in size. Pericardium: Trivial pericardial effusion is present. The pericardial effusion is posterior  to the left ventricle. Mitral Valve: The  mitral valve is grossly normal. Mild mitral valve regurgitation. Tricuspid Valve: The tricuspid valve is grossly normal. Tricuspid valve regurgitation is trivial. Aortic Valve: The aortic valve is tricuspid. There is mild to moderate aortic valve annular calcification. Aortic valve regurgitation is not visualized. Pulmonic Valve: The pulmonic valve was grossly normal. Pulmonic valve regurgitation is trivial. Aorta: The aortic root is normal in size and structure. Venous: The inferior vena cava is normal in size with greater than 50% respiratory variability, suggesting right atrial pressure of 3 mmHg. IAS/Shunts: No atrial level shunt detected by color flow Doppler.  LEFT VENTRICLE PLAX 2D LVIDd:         5.20 cm  Diastology LVIDs:         3.60 cm  LV e' medial:    4.68 cm/s LV PW:         1.20 cm  LV E/e' medial:  15.7 LV IVS:        1.40 cm  LV e' lateral:   5.77 cm/s LVOT diam:     2.10 cm  LV E/e' lateral: 12.8 LV SV:         56 LV SV Index:   27 LVOT Area:     3.46 cm  RIGHT VENTRICLE RV S prime:     10.90 cm/s TAPSE (M-mode): 2.0 cm LEFT ATRIUM             Index       RIGHT ATRIUM           Index LA diam:        4.40 cm 2.08 cm/m  RA Area:     20.50 cm LA Vol (A2C):   77.5 ml 36.66 ml/m RA Volume:   65.10 ml  30.79 ml/m LA Vol (A4C):   93.2 ml 44.08 ml/m LA Biplane Vol: 90.5 ml 42.80 ml/m  AORTIC VALVE LVOT Vmax:   80.90 cm/s LVOT Vmean:  51.400 cm/s LVOT VTI:    0.162 m  AORTA Ao Root diam: 3.10 cm MITRAL VALVE MV Area (PHT): 4.80 cm    SHUNTS MV Decel Time: 158 msec    Systemic VTI:  0.16 m MV E velocity: 73.70 cm/s  Systemic Diam: 2.10 cm MV A velocity: 62.40 cm/s MV E/A ratio:  1.18 Nona Dell MD Electronically signed by Nona Dell MD Signature Date/Time: 03/01/2021/12:47:23 PM    Final        Medical Problem List and Plan: 1.  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  -patient may  shower  -ELOS/Goals: 9-13 days/supervision/Mod I  Admit to CIR 2.  Antithrombotics: -DVT/anticoagulation: Lovenox  -antiplatelet therapy: Aspirin 325 mg daily and Plavix 75 mg daily x3 months then aspirin alone 3. Pain Management: Lidoderm patch 4. Mood: Provide emotional support  -antipsychotic agents: N/A 5. Neuropsych: This patient is capable of making decisions on her own behalf. 6. Skin/Wound Care: Routine skin checks 7. Fluids/Electrolytes/Nutrition: Routine in and outs  CMP ordered for tomorrow 8.  Hypertension.  Norvasc 10 mg daily, lisinopril 2.5 mg daily.    Monitor with increased mobility 9.  Hyperlipidemia: Lipitor 10.  History of diastolic congestive heart failure.    Monitor for signs and symptoms of fluid overload 11.  Tobacco abuse.  NicoDerm patch.  Provide counseling 12.  Thyroid nodule.  Incidental finding.  Outpatient percutaneous sampling.  Mcarthur Rossetti Angiulli, PA-C 03/05/2021  I have personally performed a face to  face diagnostic evaluation, including, but not limited to relevant history and physical exam findings, of this patient and developed relevant assessment and plan.  Additionally, I have reviewed and concur with the physician assistant's documentation above.  Maryla Morrow, MD, ABPMR  The patient's status has not changed. Any changes from the pre-admission screening or documentation from the acute chart are noted above.   Maryla Morrow, MD, ABPMR

## 2021-03-05 NOTE — Discharge Instructions (Signed)
To Mr. Humble,  It was a pleasure taking care of you during your stay at [facility]. You were found to have a stroke on imaging and were treated with aspirin, Plavix, and Lipitor. Please continue to take your Lipitor and Aspirin daily. Continue to take your Plavix for the next 3 months (a total of 85 pills) then stop taking Plavix. You also worked with physical therapy and occupational therapy who recommended inpatient rehab, which you will be discharged to today.   Your vitamin D levels were found to be low during your stay. Please take your vitamin D supplement daily.  Please continue to take your blood pressure medications (Norvasc and Zestril) daily.  Please follow up with your primary care provider as well.     Driving After a Stroke Driving can be dangerous after a stroke because a stroke can cause physical, emotional, cognitive, and behavioral changes. Damage to your brain and other parts of your nervous system may affect your ability to drive. You may have weakness, stiffness, and pain, and have problems moving, talking, seeing, touching, or problem-solving. A stroke can also cause inability to move (paralysis) on one side of your body. Can I return to driving? Ask your health care provider when it is safe for you to drive. Laws on driving after a stroke vary by state. Your health care provider may recommend that you:  Get a driving evaluation to have your vision, thinking, reaction time, and driving skills tested.  Take a driving rehabilitation program for people who have had a stroke.  Take a driving class or a retraining program.   How is driving affected by a stroke? A family member may be the first to notice that it is not safe for you to drive. You may have problems with:  Your vision.  Talking and communicating.  Weakness, pain, and stiffness in your arms or legs.  Responding to changes on the road.  Using the steering wheel, pedals, and other parts of the car.  Thinking  while driving.  Judgment on the road. What are some signs that it may not be safe for me to drive? Signs that driving may be unsafe for you include:  Driving too fast or too slowly.  Needing help from others while driving.  Not paying attention to street signs or signals.  Making bad decisions while driving.  Not keeping enough distance between cars.  Drifting into other lanes.  Becoming confused, angry, or frustrated.  Getting lost in familiar places.  Having accidents while driving. What is adaptive equipment? Adaptive equipment refers to devices that can help people who have had a stroke to drive and do other activities. You may need:  A wheelchair-accessible car.  Special hand controls in the car.  Pedal extensions for the car.  A seat base to help you stay positioned in your seat.  Lifts and ramps to help you get in and out of the car. Summary  Damage to your brain and other parts of your nervous system may affect your ability to drive.  Ask your health care provider when it is safe for you to drive again. You may need to take steps such as getting a driving evaluation or taking a driving class.  A family member may be the first to notice that it is not safe for you to drive.  You may need adaptive equipment to drive safely. This information is not intended to replace advice given to you by your health care provider. Make  sure you discuss any questions you have with your health care provider. Document Revised: 09/17/2017 Document Reviewed: 01/11/2017 Elsevier Patient Education  2021 [employer]   Physical Therapy After a Stroke After a stroke, some people experience physical changes or problems. Physical therapy may be prescribed to help you recover and overcome problems such as:  Inability to move (paralysis) or weakness, typically affecting one side of the body.  Trouble with balance.  Pain, a pins and needles sensation, or numbness in certain parts  of the body. You may also have difficulty feeling touch, pressure, or changes in temperature.  Involuntary muscle tightening (spasticity).  Stiffness in muscles and joints.  Altered coordination and reflexes. What causes physical disability after a stroke? A stroke can damage parts of your brain that control your body's normal functions, including your ability to move and to keep your balance. The types of physical problems you have will depend on how severe the stroke was and where it was located in the brain. Weakness or paralysis may affect just your fingers and hands, a whole leg or arm, or an entire side of your body. What is physical therapy? Physical therapy involves using exercises, stretches, and activities to help you regain movement and independence after your stroke. Physical therapy may focus on one or more of the following:  Range of motion. This can help with movement and reduce muscle stiffness.  Balance. This helps to lower your risk of falling.  Position changes or transfers, such as moving from sitting to standing or from a chair to a bed.  Coordination, such as getting an object from a shelf.  Muscle strength. Muscles may be strengthened with weights or by repeating certain motions.  Functional mobility. This may include stair training or learning how to use a wheelchair, walker, or cane.  Walking (gait training).  Activities of daily living, such as getting out of the car or buttoning a shirt. Why is physical therapy important? It is important to do exercises and follow your rehabilitation plan as told by your physical therapist. Physical therapy can:  Help you regain independence.  Prevent injury from falls by building strength and balance.  Lower your risk of blood clots.  Lower your risk of skin sores (pressure injuries).  Increase physical activity and exercise. This may help lower your risk for another stroke.  Help reduce pain. When will therapy  start and where will I have therapy? Your health care provider will decide when it is best for you to start therapy. In some cases, people start rehabilitation, including physical therapy, as soon as they are medically stable, which may be 24-48 hours after a stroke. Rehabilitation can take place in a few different places, based on your needs. It may take place in:  The hospital or an in-patient rehabilitation hospital.  An outpatient rehabilitation facility.  A long-term care facility.  A community rehabilitation clinic.  Your home. What are assistive devices? Assistive devices are tools to help you move, maintain balance, and manage daily tasks while recovering from a stroke. Your physical therapist may recommend and help you learn to use:  Equipment to help you move, such as wheelchairs, canes, or walkers.  Braces or splints to keep your arms, hands, legs, or feet in a comfortable and safe position.  Bathtub benches or grab bars to keep you safe in the bathroom.  Special utensils, bowls, and plates that allow you to eat with one hand. It is important to use these devices as told  by your health care provider.   Summary  After a stroke, some people may experience physical disabilities, such as weakness or paralysis, pain, or balance problems.  Physical therapy involves exercises, stretches, and activities that help to improve your ability to move and to handle daily tasks.  Physical therapy exercises focus on restoring range of motion, balance, coordination, muscle strength, and the ability to move (mobility).  Physical therapy can help you regain independence, prevent falls, and allow you to live a more active lifestyle after a stroke. This information is not intended to replace advice given to you by your health care provider. Make sure you discuss any questions you have with your health care provider. Document Revised: 01/26/2019 Document Reviewed: 01/11/2017 Elsevier Patient  Education  2021 [employer].

## 2021-03-05 NOTE — Progress Notes (Signed)
Inpatient Rehabilitation Medication Review by a Pharmacist   A complete drug regimen review was completed for this patient to identify any potential clinically significant medication issues.   Clinically significant medication issues were identified:  no   Check AMION for pharmacist assigned to patient if future medication questions/issues arise during this admission.   Pharmacist comments:    Time spent performing this drug regimen review (minutes):  10 minutes     Charles Callahan 03/05/2021 9:16 PM

## 2021-03-05 NOTE — Progress Notes (Signed)
Inpatient Rehab Admissions Coordinator:   I have a bed available for pt to admit to CIR today.  Dr. Sande Brothers in agreement.  Will let pt/family and TOC team know.   Estill Dooms, PT, DPT Admissions Coordinator [phone] 03/05/21  1:08 PM

## 2021-03-05 NOTE — Progress Notes (Addendum)
.    Subjective:  O/N Events: None  Patient examined at bedside. States he is feeling good and his hand strength is improving.  Patient states sensation in fingers is improving. Mostly only in the fingertips. Advised frequently reposition heel.   Objective:  Vital signs in last 24 hours: Vitals:   03/04/21 1543 03/04/21 2002 03/05/21 0015 03/05/21 0337  BP: (!) 156/94 (!) 155/82 (!) 149/93 (!) 161/93  Pulse: (!) 59 60 (!) 56 (!) 55  Resp: 16 18 18 14   Temp: 97.8 F (36.6 C) (!) 97.4 F (36.3 C) 98.9 F (37.2 C) 97.7 F (36.5 C)  TempSrc: Oral Oral Oral Oral  SpO2: 98% 95% 99% 99%   CBC Latest Ref Rng & Units 03/02/2021 03/01/2021 02/27/2021  WBC 4.0 - 10.5 K/uL 4.4 4.1 5.6  Hemoglobin 13.0 - 17.0 g/dL 04/29/2021 08.6 76.1  Hematocrit 39.0 - 52.0 % 43.9 41.0 47.2  Platelets 150 - 400 K/uL 232 222 281   CMP Latest Ref Rng & Units 03/05/2021 03/04/2021 03/03/2021  Glucose 70 - 99 mg/dL 03/05/2021) 932(I) 712(W)  BUN 6 - 20 mg/dL 16 15 10   Creatinine 0.61 - 1.24 mg/dL 580(D 9.83  Sodium 135 - 145 mmol/L 133(L) 133(L) 134(L)  Potassium 3.5 - 5.1 mmol/L 4.1 4.3 4.3  Chloride 98 - 111 mmol/L 103 103 102  CO2 22 - 32 mmol/L 26 27 28   Calcium 8.9 - 10.3 mg/dL 11.4(H) 11.7(H) 11.3(H)  Total Protein 6.5 - 8.1 g/dL - - 6.3(L)  Total Bilirubin 0.3 - 1.2 mg/dL - - 0.7  Alkaline Phos 38 - 126 U/L - - 76  AST 15 - 41 U/L - - 14(L)  ALT 0 - 44 U/L - - 19   Physical Exam Constitutional: Patient lying in the bed comfortably. Not in acute distress. Cardiovascular-regular rate and rhythm Extremities-no swelling.  Skin: Warm and dry. Neurological: Patient is alert and oriented x4 Sensation on forehead equal on both sides.  Decreased sensation on right side of face.  Cranial nerves II to XII grossly intact. 4/5 strength in RUE and RLE. 5/5 strength in LUE and LLE. Decreased sensation in right arm and right leg as compared to left. Assessment/Plan:  Principal Problem:   Acute CVA  (cerebrovascular accident) [facility]) Active Problems:   Hypertension   Substance use disorder Thyroid nodule Cocaine use disorder Tobacco use disorder  Mr. Siwek is a 50 year old gentleman with medical history significant for poorly controlled hypertension, GERD, cocaine use disorder (last use 1 month ago), and tobacco disorder here for management of acute CVA. Now medically stable waiting for CIR placement.  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 Pressures overnight are elevated with systolic between [facility] and diastolic between Vilma Prader mmHg.  BP in the morning 161/93 mmHg. BP goal 130-150 mmHg because of stenosis) - Appreciate Neurology's recommendations - Continue ASA 325mg  plus Plavix 75 mg daily for 3 months then stand alone ASA.  - Continue Lipitor 80 mg daily  --Continue amlodipine 10 mg daily and lisinopril 2.5 mg daily   Thyroid Nodule Incidental finding on CT Angio. U/S Showing a 3.7 cm (Max), 3.5 x3.4 hypoechoic, lobulated nodule. TSH wnl. PTH normal at 63.  - Outpatient percutaneous sampling.    History of Uncontrolled  Hypertention  BP today- 161/93 mmHg.  -IV Hydralazine 10 mg PRN  (BP Goal systolic 130-150 mmHg).  -Continue amlodipine amlodipine 10 mg daily and lisinopril 2.5 mg daily.  Mild Hypercalcemia Calcium 11.4. Vitamin D low at 15.09, PTH  Normal at 63. Calcitriol normal at 63.6. PTH r Peptite pending.  -F/U PTH r peptide  -Start Vitamin D 1000 mg daily.  Prior to Admission Living Arrangement: Home Anticipated Discharge Location : CIR Barriers to Discharge: Medically stable waiting for CIR placement.  Karsten Ro, MD 03/05/2021, 5:41 AM Pager: [phone] After 5pm on weekdays and 1pm on weekends: On Call pager [phone]

## 2021-03-05 NOTE — TOC Transition Note (Signed)
Transition of Care [facility]) - CM/SW Discharge Note   Patient Details  Name: Charles Callahan MRN: [id number] Date of Birth: 08/23/1971  [facility] CM/SW Contact:  Kermit Balo, RN Phone Number: 03/05/2021, 10:30 AM   Clinical Narrative:    Patient is discharging to CIR today. CM signing off.   Final next level of care: IP Rehab Facility Barriers to Discharge: Inadequate or no insurance,Barriers Unresolved (comment)   Patient Goals and CMS Choice Patient states their goals for this hospitalization and ongoing recovery are:: return home tohis mother's house      Discharge Placement                       Discharge Plan and Services In-house Referral: Clinical Social Work Discharge Planning Services: CM Consult                                 Social Determinants of Health (SDOH) Interventions     Readmission Risk Interventions No flowsheet data found.

## 2021-03-05 NOTE — Discharge Summary (Signed)
Name: Charles Callahan MRN: [id number] DOB: 1971/02/18 50 y.o. PCP: Patient, No Pcp Per (Inactive)  Date of Admission: 02/27/2021  5:58 PM Date of Discharge: 03/05/2021 Attending Physician: No att. providers found  Discharge Diagnosis: 1. 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 Thyroid Mass 3. Uncontrolled Hypertension 4. Hypercalcemia 5. Tobacco Use Disorder 6. Suspicion for OSA  Discharge Medications: Allergies as of 03/05/2021      Reactions   Morphine And Related Nausea And Vomiting      Medication List    ASK your doctor about these medications   albuterol 108 (90 Base) MCG/ACT inhaler Commonly known as: VENTOLIN HFA Inhale 1-2 puffs into the lungs every 6 (six) hours as needed for wheezing.   cetirizine 10 MG tablet Commonly known as: ZYRTEC Take 1 tablet (10 mg total) by mouth daily.   dicyclomine 20 MG tablet Commonly known as: BENTYL Take 1 tablet (20 mg total) by mouth 2 (two) times daily.   HYDROcodone-homatropine 5-1.5 MG/5ML syrup Commonly known as: HYCODAN Take 5 mLs by mouth every 6 (six) hours as needed for cough.   ibuprofen 600 MG tablet Commonly known as: ADVIL Take 1 tablet (600 mg total) by mouth every 8 (eight) hours as needed for pain or fever.   lisinopril 20 MG tablet Commonly known as: ZESTRIL Take 1 tablet (20 mg total) by mouth daily.   omeprazole 20 MG capsule Commonly known as: PRILOSEC Take 1 capsule (20 mg total) by mouth daily.   ondansetron 4 MG tablet Commonly known as: ZOFRAN Take 1 tablet (4 mg total) by mouth every 6 (six) hours.   ondansetron 4 MG tablet Commonly known as: ZOFRAN Take 1 tablet (4 mg total) by mouth every 6 (six) hours.   permethrin 5 % cream Commonly known as: ELIMITE Apply to affected area once   sucralfate 1 GM/10ML suspension Commonly known as: Carafate Take 10 mLs (1 g total) by mouth 4 (four) times daily -  with meals and at bedtime.   tobramycin 0.3 % ophthalmic  solution Commonly known as: TOBREX Place 1 drop into the right eye every 6 (six) hours.       Disposition and follow-up:   [name] was discharged from [facility] in Stable condition.  At the hospital follow up visit please address:  1. 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   2. Thyroid Mass  - Acquire Fine Needle Aspiration  3. Uncontrolled Hypertension  - Continue Amlodipine 10 mg daily  - Continue Lisinopril 2.5 mg daily  4. Hypercalcemia  - Follow up PTH related peptide   5. Tobacco Use Disorder  - Continue Nicotine Patch  6. Suspicion for OSA  - Sleep study   2.  Labs / imaging needed at time of follow-up: FNA, Sleep Study   3.  Pending labs/ test needing follow-up:PTH-rp  Follow-up Appointments:  Follow-up Information    [facility]. Call.   Why: Call to schedule an appoiuntment to establish a primary care provider. You may use thier low cost pharmacy once you schedule an appointment Contact information: [address] [place] [address] [phone]       [facility]. Schedule an  appointment as soon as possible for a visit in 4 week(s).   Specialty: Neurology Contact information: [address] [place] [address] [phone]              Hospital Course by problem list: 1. 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 Patient was started on DAPT and Lipitor. He was found to have severe Left MCA stenosis with reduced flow in the distal L MCA branches.  A1c was 5.9. Lipid panel showed LDL of 102.   Echo was unrevealing. He worked with Physical therapy and occupational therapy, who recommended CIR for his deficits. Patient was discharged on DAPT to CIR in stable condition. He will follow with CIR.  2. Thyroid Mass Incidental finding CTA head and neck. This was followed up with a ultrasound of the thyroid which found a solitary 3.7 TR5 R sided thyroid nodule. Plans to get a fine needle biopsy planned in the outpatient setting.  3. Uncontrolled Hypertension Patient presented for CVA and found to have elevated pressures in the systolic pressure elevated to 035. After his Hypertensive window he was started on Norvasc and Zestril. He was discharged to CIR in stable condition.   4. Hypercalcemia Patient found to have elevated Calcium 11.5 which corrected to 10.5 after a fluid bolus. His Vit D 25 hydroxy was found to be low. His PTH and Calcitriol were normal. At the time of DC PTH-rp was pending. He was discharged with vitamin D supplementation.  5. Tobacco Use Disorder Patient is a current half pack day smoker. He is attempting to stop his tobacco use. He was started on a nicotine patch.   Pertinent Labs, Studies, and Procedures:  CLINICAL DATA:  Sharp left-sided chest pain for 1 hour with shortness of breath and nausea.  EXAM: CT ANGIOGRAPHY CHEST, ABDOMEN AND PELVIS  TECHNIQUE: Non-contrast CT of the chest was initially obtained.  Multidetector CT imaging through the chest, abdomen and pelvis was performed using the standard protocol during bolus administration of intravenous contrast. Multiplanar reconstructed images and MIPs were obtained and reviewed to evaluate the vascular anatomy.  CONTRAST:  OMNIPAQUE IOHEXOL 350 MG/ML SOLN  COMPARISON:  None.  FINDINGS: CTA CHEST FINDINGS  Cardiovascular: There is no evidence for a thoracic aortic aneurysm or dissection. There are minimal atherosclerotic changes of the thoracic  aorta. There is no large centrally located pulmonary embolism. No significant pericardial effusion. There is mild cardiomegaly.  Mediastinum/Nodes:  -- No mediastinal lymphadenopathy.  -- No hilar lymphadenopathy.  -- No axillary lymphadenopathy.  -- No supraclavicular lymphadenopathy.  --there is a large left-sided thyroid nodule measuring approximately 4.4 x 3.4 cm arising from the inferior left thyroid gland.  -  Unremarkable esophagus.  Lungs/Pleura: Airways are patent. No pleural effusion, lobar consolidation, pneumothorax or pulmonary infarction.  Musculoskeletal: No chest wall abnormality. No bony spinal canal stenosis.  Review of the MIP images confirms the above findings.  CTA ABDOMEN AND PELVIS FINDINGS  VASCULAR  Aorta: Normal caliber aorta without aneurysm, dissection, vasculitis or significant stenosis.  Celiac: Patent without evidence of aneurysm, dissection, vasculitis or significant stenosis.  SMA: Patent without evidence of aneurysm, dissection, vasculitis or significant stenosis.  Renals: Both renal arteries are patent without evidence of aneurysm, dissection, vasculitis, fibromuscular dysplasia or significant stenosis. There are 2 right renal arteries.  IMA: Patent without evidence of aneurysm, dissection, vasculitis  or significant stenosis.  Inflow: Patent without evidence of aneurysm, dissection, vasculitis or significant stenosis.  Veins: No obvious venous abnormality within the limitations of this arterial phase study.  Review of the MIP images confirms the above findings.  NON-VASCULAR  Hepatobiliary: The liver is normal. Normal gallbladder.There is no biliary ductal dilation.  Pancreas: Normal contours without ductal dilatation. No peripancreatic fluid collection.  Spleen: Unremarkable.  Adrenals/Urinary Tract:  --Adrenal glands: Unremarkable.  --Right kidney/ureter: No hydronephrosis or  radiopaque kidney stones.  --Left kidney/ureter: No hydronephrosis or radiopaque kidney stones.  --Urinary bladder: Unremarkable.  Stomach/Bowel:  --Stomach/Duodenum: No hiatal hernia or other gastric abnormality. Normal duodenal course and caliber.  --Small bowel: Unremarkable.  --Colon: Unremarkable.  --Appendix: Normal.  Vascular/Lymphatic: Normal course and caliber of the major abdominal vessels.  --No retroperitoneal lymphadenopathy.  --No mesenteric lymphadenopathy.  --No pelvic or inguinal lymphadenopathy.  Reproductive: Unremarkable  Other: No ascites or free air. The abdominal wall is normal.  Musculoskeletal. No acute displaced fractures.  Review of the MIP images confirms the above findings.  IMPRESSION: 1. No evidence for an aortic dissection.  No acute abnormality. 2. Large left-sided thyroid nodule. Outpatient thyroid ultrasound follow-up is recommended.(Ref: J Am Coll Radiol. 2015 Feb;12(2): 143-50). 3.  Aortic Atherosclerosis (ICD10-I70.0).   CLINICAL DATA:  Right arm weakness over the last 2 days. Question stroke.  EXAM: CT HEAD WITHOUT CONTRAST  TECHNIQUE: Contiguous axial images were obtained from the base of the skull through the vertex without intravenous contrast.  COMPARISON:  None.  FINDINGS: Brain: No focal abnormality seen affecting the brainstem or cerebellum. Cerebral hemispheres show low-density within the white matter consistent with small vessel ischemic change. The age of these white matter insults is indeterminate. Whereas most are likely old, a recent white matter infarction is not excluded. No cortical or large vessel territory infarction is seen. No mass, hemorrhage, hydrocephalus or extra-axial collection.  Vascular: There is atherosclerotic calcification of the major vessels at the base of the brain.  Skull: Negative  Sinuses/Orbits: Clear/normal  Other: None  IMPRESSION: No acute  finding by CT. Areas of low-density in the cerebral hemispheric white matter consistent with small vessel ischemic change. This is presumed to be chronic, but a recent small vessel infarction could be hidden within the chronic insults.   CLINICAL DATA:  Initial evaluation for neuro deficit, stroke suspected, right-sided weakness.  EXAM: MRI HEAD WITHOUT CONTRAST  TECHNIQUE: Multiplanar, multiecho pulse sequences of the brain and surrounding structures were obtained without intravenous contrast.  COMPARISON:  Prior CT from earlier the same day.  FINDINGS: Brain: Cerebral volume within normal limits for age. Patchy T2/FLAIR hyperintensity within the periventricular and deep white matter both cerebral hemispheres most consistent with chronic small vessel ischemic disease, moderate for age. Patchy involvement of the pons noted. Superimposed small remote lacunar infarct at the right thalamus.  Multiple scattered cortical and subcortical foci of restricted diffusion seen involving the left cerebral hemisphere, with involvement of the left frontal, parietal, temporal, and occipital lobes. For reference purposes, the largest distinct area of infarction seen at the posterior left frontal corona radiata and measures approximately 1 cm. No associated hemorrhage or mass effect. No other evidence for acute or subacute ischemia. Gray-white matter differentiation otherwise maintained. Multiple scattered punctate chronic micro hemorrhages noted about the cerebellum and thalami, likely related to chronic underlying hypertension.  No mass lesion, midline shift or mass effect. No hydrocephalus or extra-axial fluid collection. Pituitary gland suprasellar region within normal limits. Midline structures intact.  Vascular:  Major intracranial vascular flow voids are maintained.  Skull and upper cervical spine: Craniocervical junction normal. Bone marrow signal intensity within normal  limits. No scalp soft tissue abnormality.  Sinuses/Orbits: Globes and orbital soft tissues within normal limits. Scattered mucosal thickening noted within the ethmoidal air cells and maxillary sinuses. Paranasal sinuses are otherwise clear. Trace bilateral mastoid effusions, of doubtful significance.  Other: None.  IMPRESSION: 1. Multiple scattered cortical and subcortical acute ischemic nonhemorrhagic left cerebral infarcts as above. No associated hemorrhage or mass effect. 2. Underlying moderate chronic microvascular ischemic disease. 3. Multiple chronic micro hemorrhages clustered about the cerebellum and thalami, suggesting chronic poorly controlled hypertension.  CLINICAL DATA:  Chest pain.  EXAM: CHEST - 2 VIEW  COMPARISON:  02/16/2021  FINDINGS: The lungs are clear without focal pneumonia, edema, pneumothorax or pleural effusion. Interstitial markings are diffusely coarsened with chronic features. The cardio pericardial silhouette is enlarged. The visualized bony structures of the thorax show no acute abnormality. Telemetry leads overlie the chest.  IMPRESSION: No active cardiopulmonary disease   CLINICAL DATA:  Right arm weakness and numbness, left cerebral infarcts on MRI  EXAM: CT ANGIOGRAPHY HEAD AND NECK  TECHNIQUE: Multidetector CT imaging of the head and neck was performed using the standard protocol during bolus administration of intravenous contrast. Multiplanar CT image reconstructions and MIPs were obtained to evaluate the vascular anatomy. Carotid stenosis measurements (when applicable) are obtained utilizing NASCET criteria, using the distal internal carotid diameter as the denominator.  CONTRAST:  50mL OMNIPAQUE IOHEXOL 350 MG/ML SOLN  COMPARISON:  CT head 02/27/2021. Correlation made with MRI 02/27/2021  FINDINGS: CT HEAD  Brain: Multiple small left cerebral hemisphere infarcts are better seen on the prior MRI. There is  no acute intracranial hemorrhage. No new loss of gray-white differentiation. Stable findings of probable chronic microvascular ischemic changes. No extra-axial fluid collection.  Vascular: No new findings.  Skull: Calvarium is unremarkable.  Sinuses/Orbits: No acute finding.  Other: None.  Review of the MIP images confirms the above findings  CTA NECK  Aortic arch: Great vessel origins are patent.  Right carotid system: Patent.  No stenosis at the ICA origins.  Left carotid system: Patent.  No stenosis at the ICA origin.  Vertebral arteries: Patent and codominant.  No stenosis.  Skeleton: Mild degenerative changes of the cervical spine.  Other neck: As seen on recent chest CT, there is a 3-4 cm mass abutting or arising from the left thyroid lobe.  Upper chest: Visualized lung apices are clear.  Review of the MIP images confirms the above findings  CTA HEAD  Anterior circulation: Intracranial internal carotid arteries are patent with mild calcified plaque. Anterior cerebral arteries are patent. Right A1 ACA is hypoplastic. Right middle cerebral artery is patent. There is severe stenosis of the proximal to mid left M1 MCA. Flow within the distal left MCA branches appears relatively decreased compared to the right.  Posterior circulation: Intracranial vertebral arteries are patent. Basilar artery is patent. Major cerebellar artery origins patent. Posterior cerebral arteries are patent.  Venous sinuses: Patent as allowed by contrast bolus timing.  Review of the MIP images confirms the above findings  IMPRESSION: No acute intracranial hemorrhage. Small infarcts better seen on prior MRI.  No hemodynamically significant stenosis in the neck.  Severe left M1 MCA stenosis with reduced flow in the distal left MCA branches compared to the right.  3-4 cm mass abutting or arising from the left thyroid lobe. Ultrasound is recommended for further  evaluation.  CLINICAL DATA:  Palpable abnormality.  Palpable thyroid nodule.  EXAM: THYROID ULTRASOUND  TECHNIQUE: Ultrasound examination of the thyroid gland and adjacent soft tissues was performed.  COMPARISON:  None.  FINDINGS: Parenchymal Echotexture: Normal  Isthmus: Normal in size measuring 0.2 cm in diameter  Right lobe: Normal in size measuring 5.3 x 2.0 x 2.2 cm  Left lobe: Enlarged measuring 6.0 x 3.0 x 2.6 cm  _________________________________________________________  Estimated total number of nodules >/= 1 cm: 1  Number of spongiform nodules >/=  2 cm not described below (TR1): 0  Number of mixed cystic and solid nodules >/= 1.5 cm not described below (TR2): 0  _________________________________________________________  Nodule # 1:  Location: Left; Inferior  Maximum size: 3.7 cm; Other 2 dimensions: 3.5 x 3.4 cm  Composition: solid/almost completely solid (2)  Echogenicity: hypoechoic (2)  Shape: taller-than-wide (3)  Margins: lobulated/irregular (2)  Echogenic foci: none (0)  ACR TI-RADS total points: 9.  ACR TI-RADS risk category: TR5 (>/= 7 points).  ACR TI-RADS recommendations:  **Given size (>/= 1.0 cm) and appearance, fine needle aspiration of this highly suspicious nodule should be considered based on TI-RADS criteria.  _________________________________________________________  IMPRESSION: Solitary 3.7 cm TR5 (highly suspicious) right-sided thyroid nodule meets imaging criteria to recommend percutaneous sampling.    Ref Range & Units 6 d ago  Opiates NONE DETECTED NONE DETECTED   Cocaine NONE DETECTED NONE DETECTED   Benzodiazepines NONE DETECTED NONE DETECTED   Amphetamines NONE DETECTED NONE DETECTED   Tetrahydrocannabinol NONE DETECTED NONE DETECTED   Barbiturates NONE DETECTED NONE DETECTED      Component Ref Range & Units 6 d ago  (02/27/21) 3 yr ago  (09/20/17) 5 yr ago  (01/23/16) 5 yr ago   (01/23/16) 9 yr ago  (04/21/11)  Color, Urine YELLOW YELLOW  YELLOW   YELLOW  YELLOW   APPearance CLEAR HAZYAbnormal  CLEAR   CLEAR  CLOUDYAbnormal   Specific Gravity, Urine 1.005 - 1.030 1.019  1.020   1.020  1.021   pH 5.0 - 8.0 5.0  5.0   6.5  7.0   Glucose, UA NEGATIVE mg/dL NEGATIVE  NEGATIVE   NEGATIVE  NEGATIVE   Hgb urine dipstick NEGATIVE SMALLAbnormal  NEGATIVE   NEGATIVE  NEGATIVE   Bilirubin Urine NEGATIVE NEGATIVE  NEGATIVE   NEGATIVE  NEGATIVE   Ketones, ur NEGATIVE mg/dL NEGATIVE  NEGATIVE   NEGATIVE  NEGATIVE   Protein, ur NEGATIVE mg/dL NEGATIVE  NEGATIVE   [id number]  NEGATIVE   Nitrite NEGATIVE NEGATIVE  NEGATIVE   NEGATIVE  NEGATIVE   Leukocytes,Ua NEGATIVE LARGEAbnormal       RBC / HPF 0 - 5 RBC/hpf 0-5  0-5  0-5     WBC, UA 0 - 5 WBC/hpf >50High  TOO NUMEROUS TO COUNT  6-30     Bacteria, UA NONE SEEN NONE SEEN  RAREAbnormal  FEWAbnormal     Squamous Epithelial / LPF 0 - 5 0-5  0-5Abnormal R  0-5Abnormal R     Mucus  PRESENT  PRESENT      Comment: Performed at [facility], [address], [place], [place] [address]  Leukocytes, UA   LARGEAbnormal R   MODERATEAbnormal R  NEGATIVE R, CM    Component 5 d ago  Neisseria Gonorrhea Negative   Chlamydia Negative   Comment Normal Reference Ranger Chlamydia - Negative   Comment Normal Reference Range Neisseria Gonorrhea - Negative    Component 5 d ago  Specimen  Description URINE, RANDOM   Special Requests NONE   Culture Abnormal <10,000 COLONIES/mL INSIGNIFICANT GROWTH  Performed at [facility], [address], [place], [place] [address]    Report Status 03/01/2021 FINAL   Resulting Agency [facility]         Specimen Collected: 02/28/21 08:12 Last Resulted: 03/01/21 10:24       RPR Ser Ql NON REACTIVE NON REACTIVE    Component Ref Range & Units 5 d ago  (02/28/21) 5 d ago  (02/28/21) 5 d ago  (02/28/21) 2 wk ago  (02/16/21) 2 wk ago  (02/16/21)  Troponin I (High  Sensitivity) <18 ng/L 19High  19High CM  15 CM  21High CM  22High CM    Component Ref Range & Units 4 d ago 5 yr ago 11 yr ago  Cholesterol 0 - 200 mg/dL 244  628 R  638      ATP III CLASSIFICATION:  <200   mg/dL  Desirable  177-116 mg/dL  Borderline High  >=579  mg/dL  High       Triglycerides <150 mg/dL 46  038  74   HDL >33 mg/dL 38VAN  19TYO R  06YOK R   Total CHOL/HDL Ratio RATIO 4.5  5.2High R  5.5   VLDL 0 - 40 mg/dL 9  21 R  15   LDL Cholesterol 0 - 99 mg/dL 599HFSF  423 R, CM  High 110      Total Cholesterol/HDL:CHD Risk  Coronary Heart      Ref Range & Units 2 d ago  Vit D, 1,25-Dihydroxy 19.9 - 79.3 pg/mL 63.6        Ref Range & Units 2 d ago  Vit D, 25-Hydroxy 30 - 100 ng/mL 15.09Low       Ref Range & Units 2 d ago  PTH 15 - 65 pg/mL 63    Component Ref Range & Units 2 d ago  (03/03/21) 3 d ago  (03/02/21) 4 d ago  (03/01/21) 6 d ago  (02/27/21) 2 wk ago  (02/16/21) 2 wk ago  (02/16/21) 3 yr ago  (09/20/17)  Sodium 135 - 145 mmol/L 134Low  134Low  135  133Low   134Low  137   Potassium 3.5 - 5.1 mmol/L 4.3  4.0  3.7  3.8   3.9  3.9   Chloride 98 - 111 mmol/L 102  105  105  102   100  107 R   CO2 22 - 32 mmol/L 28  26  25  26   23  24    Glucose, Bld 70 - 99 mg/dL  953UYEB CM  343HWYS CM  99 CM   96 CM  108High R   Comment: Glucose reference range applies only to samples taken after fasting for at least 8 hours.  BUN 6 - 20 mg/dL 10  6  8  15   13  13    Creatinine, Ser 0.61 - 1.24 mg/dL 168HFGB   0.21  1.27High   1.22  0.95   Calcium 8.9 - 10.3 mg/dL 1.15  10.7High  10.5High  11.5High   11.5High  11.3High   Total Protein 6.5 - 8.1 g/dL 5.20   80.2MVVK  6.5  7.5   7.2   Albumin 3.5 - 5.0 g/dL 1.2AES   9.7NPY  3.6  3.9   4.1   AST 15 - 41 U/L 14Low   14Low  19  22   19  ALT 0 - 44 U/L 19   19  25  21   17  R   Alkaline Phosphatase 38 - 126 U/L 76   59  89  82   74    Total Bilirubin 0.3 - 1.2 mg/dL 0.7   0.8  0.3  1.0   0.8   GFR, Estimated >60 mL/min >60  >60 CM  >60 CM  >60 CM   >60 CM    Comment: (NOTE)  Calculated using the CKD-EPI Creatinine Equation (2021)   Anion gap 5 - 15 4Low  3Low CM  5 CM  5 CM   11 CM  6      Discharge Instructions: Discharge Instructions    Ambulatory referral to Neurology   Complete by: As directed    Follow up with stroke clinic NP (Jessica Vanschaick or , if both not available, consider Darrol Angel, or [place]) at [facility] in about 4 weeks. Thanks.     Component Ref Range & Units 5 d ago 11 yr ago  Hgb A1c MFr Bld 4.8 - 5.6 % 5.9High      Signed: [facility], MD 03/05/2021, 2:48 PM   Pager: [phone]

## 2021-03-05 NOTE — Progress Notes (Signed)
Occupational Therapy Treatment Patient Details Name: Charles Callahan MRN: [id number] DOB: 08/03/1971 Today's Date: 03/05/2021    History of present illness 50 y/o male admitted 5/12 secondary to worsening  R UE and RLE weakness, numbness. Imaging revealed multiple L hemisphere infarcts. PMH includes HTN, tobacco use, and substance abuse.   OT comments  Patient continues to work hard and progress toward patient focused OT goals.  Patient able to complete ADL sink side with setup and up to [facility] for stand balance.  R upper extremity remains weak with decreased fine and gross motor.  Increased effort and time needed for ADL performance compared to prior level of functions.  Patient is scheduled to transition to CIR today, and should do well with intensive rehab.  OT will follow in the acute setting.    Follow Up Recommendations  CIR    Equipment Recommendations  Tub/shower seat    Recommendations for Other Services      Precautions / Restrictions Precautions Precautions: Fall Restrictions Weight Bearing Restrictions: No       Mobility Bed Mobility   Bed Mobility: Supine to Sit     Supine to sit: Supervision       Patient Response: Cooperative  Transfers Overall transfer level: Needs assistance   Transfers: Sit to/from Stand Sit to Stand: Min guard              Balance Overall balance assessment: Needs assistance Sitting-balance support: Feet supported;No upper extremity supported Sitting balance-Leahy Scale: Good     Standing balance support: Bilateral upper extremity supported Standing balance-Leahy Scale: Poor Standing balance comment: using RW for in room mobility                           ADL either performed or assessed with clinical judgement   ADL       Grooming: Wash/dry hands;Wash/dry face;Oral care;Applying deodorant;Supervision/safety;Sitting   Upper Body Bathing: Supervision/ safety;Sitting   Lower Body Bathing: Min guard;Sit  to/from stand   Upper Body Dressing : Supervision/safety;Sitting   Lower Body Dressing: Min guard;Sitting/lateral leans               Functional mobility during ADLs: Minimal assistance;Rolling walker       Vision       Perception     Praxis      Cognition Arousal/Alertness: Awake/alert Behavior During Therapy: WFL for tasks assessed/performed Overall Cognitive Status: Within Functional Limits for tasks assessed                                                            Pertinent Vitals/ Pain       Pain Assessment: No/denies pain                                                          Frequency  Min 2X/week        Progress Toward Goals  OT Goals(current goals can now be found in the care plan section)  Progress towards OT goals: Progressing toward goals  Acute Rehab OT Goals Patient Stated Goal: to  get my leg and arm moving OT Goal Formulation: With patient Time For Goal Achievement: 03/15/21 Potential to Achieve Goals: Good  Plan Discharge plan remains appropriate    Co-evaluation                 AM-PAC OT "6 Clicks" Daily Activity     Outcome Measure   Help from another person eating meals?: None Help from another person taking care of personal grooming?: None Help from another person toileting, which includes using toliet, bedpan, or urinal?: A Little Help from another person bathing (including washing, rinsing, drying)?: A Little Help from another person to put on and taking off regular upper body clothing?: None Help from another person to put on and taking off regular lower body clothing?: A Little 6 Click Score: 21    End of Session Equipment Utilized During Treatment: Rolling walker  OT Visit Diagnosis: Unsteadiness on feet (R26.81);Other abnormalities of gait and mobility (R26.89);Muscle weakness (generalized) (M62.81);Hemiplegia and hemiparesis   Activity Tolerance Patient  tolerated treatment well   Patient Left in chair;with call bell/phone within reach   Nurse Communication Mobility status;Other (comment)        Time: 9357-0177 OT Time Calculation (min): 26 min  Charges: OT General Charges $OT Visit: 1 Visit OT Treatments $Self Care/Home Management : 23-37 mins  03/05/2021  Rich, OTR/L  Acute Rehabilitation Services  Office:  [phone]    Charles Callahan 03/05/2021, 10:52 AM

## 2021-03-06 ENCOUNTER — Encounter (HOSPITAL_COMMUNITY): Payer: Self-pay | Admitting: Physical Medicine and Rehabilitation

## 2021-03-06 DIAGNOSIS — I639 Cerebral infarction, unspecified: Secondary | ICD-10-CM

## 2021-03-06 LAB — COMPREHENSIVE METABOLIC PANEL
ALT: 56 U/L — ABNORMAL HIGH (ref 0–44)
AST: 37 U/L (ref 15–41)
Albumin: 3.2 g/dL — ABNORMAL LOW (ref 3.5–5.0)
Alkaline Phosphatase: 69 U/L (ref 38–126)
Anion gap: 4 — ABNORMAL LOW (ref 5–15)
BUN: 16 mg/dL (ref 6–20)
CO2: 26 mmol/L (ref 22–32)
Calcium: 10.9 mg/dL — ABNORMAL HIGH (ref 8.9–10.3)
Chloride: 102 mmol/L (ref 98–111)
Creatinine, Ser: 0.99 mg/dL (ref 0.61–1.24)
GFR, Estimated: >60 mL/min (ref >60)
Glucose, Bld: 97 mg/dL (ref 70–99)
Potassium: 4.3 mmol/L (ref 3.5–5.1)
Sodium: 132 mmol/L — ABNORMAL LOW (ref 135–145)
Total Bilirubin: 0.6 mg/dL (ref 0.3–1.2)
Total Protein: 6 g/dL — ABNORMAL LOW (ref 6.5–8.1)

## 2021-03-06 LAB — CBC WITH DIFFERENTIAL/PLATELET
Abs Immature Granulocytes: 0.02 10*3/uL (ref 0.00–0.07)
Basophils Absolute: 0 10*3/uL (ref 0.0–0.1)
Basophils Relative: 0 %
Eosinophils Absolute: 0.1 10*3/uL (ref 0.0–0.5)
Eosinophils Relative: 2 %
HCT: 43.7 % (ref 39.0–52.0)
Hemoglobin: 14.6 g/dL (ref 13.0–17.0)
Immature Granulocytes: 0 %
Lymphocytes Relative: 46 %
Lymphs Abs: 2.2 10*3/uL (ref 0.7–4.0)
MCH: 30.7 pg (ref 26.0–34.0)
MCHC: 33.4 g/dL (ref 30.0–36.0)
MCV: 91.8 fL (ref 80.0–100.0)
Monocytes Absolute: 0.8 10*3/uL (ref 0.1–1.0)
Monocytes Relative: 16 %
Neutro Abs: 1.7 10*3/uL (ref 1.7–7.7)
Neutrophils Relative %: 36 %
Platelets: 236 10*3/uL (ref 150–400)
RBC: 4.76 MIL/uL (ref 4.22–5.81)
RDW: 13.2 % (ref 11.5–15.5)
WBC: 4.8 10*3/uL (ref 4.0–10.5)
nRBC: 0 % (ref 0.0–0.2)

## 2021-03-06 MED ORDER — LISINOPRIL 5 MG PO TABS
5.0000 mg | ORAL_TABLET | Freq: Every day | ORAL | Status: DC
  Administered 2021-03-07: 5 mg via ORAL
  Filled 2021-03-06: qty 1

## 2021-03-06 MED ORDER — LISINOPRIL 5 MG PO TABS
2.5000 mg | ORAL_TABLET | Freq: Once | ORAL | Status: AC
  Administered 2021-03-06: 2.5 mg via ORAL
  Filled 2021-03-06: qty 1

## 2021-03-06 NOTE — Evaluation (Signed)
Occupational Therapy Assessment and Plan  Patient Details  Name: TAYSHON WINKER MRN: [id number] Date of Birth: [birth year]  OT Diagnosis: abnormal posture, hemiplegia affecting dominant side, muscle weakness (generalized) and pain in joint Rehab Potential: Rehab Potential (ACUTE ONLY): Good ELOS: 14 days   Today's Date: 03/06/2021 OT Individual Time: 1300-1400 OT Individual Time Calculation (min): 60 min     Hospital Problem: Principal Problem:   Subcortical infarction [facility])   Past Medical History:  Past Medical History:  Diagnosis Date  . CHF (congestive heart failure) ([place])   . Hypertension    Past Surgical History:  Past Surgical History:  Procedure Laterality Date  . EYE SURGERY      Assessment & Plan Clinical Impression: Patient is a 50 y.o. 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 PMH includes HTN, tobacco use, and substance abuse.  Patient transferred to CIR on 03/05/2021 .    Patient currently requires min with basic self-care skills secondary to muscle weakness and muscle joint tightness, decreased cardiorespiratoy endurance, impaired timing and sequencing, unbalanced muscle activation, motor apraxia, decreased coordination, and decreased motor planning, decreased motor planning, and decreased standing balance, hemiplegia, and decreased balance strategies.  Prior to hospitalization, patient could complete ADLs at independent level.  Patient will benefit from skilled intervention to increase independence with basic self-care skills and increase level of independence with iADL prior to discharge  home with mother .  Anticipate patient will require intermittent supervision and follow up outpatient OT.  OT - End of Session Activity Tolerance: Tolerates 30+ min activity with multiple rests Endurance Deficit: Yes Endurance Deficit  Description: rest breaks throughout evaluation tasks OT Assessment Rehab Potential (ACUTE ONLY): Good OT Barriers to Discharge: Decreased caregiver support;Home environment access/layout OT Barriers to Discharge Comments: lives with mother - intermittent spvsn/support OT Patient demonstrates impairments in the following area(s): Balance;Sensory;Endurance;Motor;Pain OT Basic ADL's Functional Problem(s): Dressing;Bathing;Toileting OT Advanced ADL's Functional Problem(s): Light Housekeeping OT Transfers Functional Problem(s): Toilet;Tub/Shower OT Additional Impairment(s): Fuctional Use of Upper Extremity OT Plan OT Intensity: Minimum of 1-2 x/day, 45 to 90 minutes OT Frequency: 5 out of 7 days OT Treatment/Interventions: Balance/vestibular training;Community reintegration;DME/adaptive equipment instruction;Discharge planning;Neuromuscular re-education;Functional mobility training;Patient/family education;Psychosocial support;Therapeutic Exercise;Therapeutic Activities;Self Care/advanced ADL retraining;UE/LE Coordination activities;UE/LE Strength taining/ROM OT Basic Self-Care Anticipated Outcome(s): mod I OT Toileting Anticipated Outcome(s): mod I OT Bathroom Transfers Anticipated Outcome(s): mod I OT Recommendation Recommendations for Other Services: Therapeutic Recreation consult Therapeutic Recreation Interventions: Outing/community reintergration Patient destination: Home Equipment Recommended: To be determined   OT Evaluation Precautions/Restrictions  Precautions Precautions: Fall Precaution Comments: R hemi Restrictions Weight Bearing Restrictions: No General Chart Reviewed: Yes Response to Previous Treatment: Patient with no complaints from previous session Family/Caregiver Present: No  Pain Pain Assessment Pain Scale: 0-10 Pain Score: 3  Pain Type: Acute pain Pain Location: Shoulder Pain Descriptors / Indicators: Aching Pain Frequency: Occasional Pain Intervention(s):  Medication (See eMAR) Home Living/Prior Functioning Home Living Living Arrangements: Parent Available Help at Discharge: Family,Available PRN/intermittently Type of Home: House Home Access: Level entry,Other (comment) (One small step up to enter) Home Layout: One level Bathroom Shower/Tub: Optometrist: Yes  Lives With: Family Prior Function Level of Independence: Independent with basic ADLs,Independent with transfers,Independent with homemaking with ambulation,Independent with gait  Able to Take Stairs?: Yes Driving: Yes Vocation: Unemployed Vocation Requirements: took time off work to take care of his mother Vision Baseline Vision/History:  No visual deficits (Pt states needs glasses) Patient Visual Report: No change from baseline Vision Assessment?: Yes Eye Alignment: Within Functional Limits Ocular Range of Motion: Within Functional Limits Alignment/Gaze Preference: Within Defined Limits Saccades: Within functional limits Convergence: Within functional limits Visual Fields: No apparent deficits Perception  Perception: Within Functional Limits Praxis Praxis: Intact Cognition Overall Cognitive Status: Within Functional Limits for tasks assessed Arousal/Alertness: Awake/alert Orientation Level: Person;Situation;Place Person: Oriented Place: Oriented Situation: Oriented Year: 2022 Month: May Day of Week: Correct Memory: Appears intact Immediate Memory Recall: Blue;Sock;Bed Memory Recall Sock: Without Cue Memory Recall Blue: Without Cue Memory Recall Bed: With Cue Awareness: Appears intact Problem Solving: Impaired Problem Solving Impairment: Functional basic;Verbal basic Safety/Judgment: Appears intact Sensation Sensation Light Touch: Impaired by gross assessment Hot/Cold: Appears Intact Proprioception: Appears Intact Stereognosis: Appears Intact Additional Comments: decreased sensation along  RLE Coordination Gross Motor Movements are Fluid and Coordinated: No Fine Motor Movements are Fluid and Coordinated: No Coordination and Movement Description: grossly uncoordinated due to R hemi, decreased balance/postural control, generalized weakness;Box and Blocks Test - LUE: 57 in 71mn, RUE: 45 in 1 min Finger Nose Finger Test: decreased ROM and dysmetria on RUE Heel Shin Test: difficulty performing on RLE 9 Hole Peg Test: LUE - 24 sec, RUE - 25 sec Motor  Motor Motor: Hemiplegia;Abnormal postural alignment and control Motor - Skilled Clinical Observations: grossly uncoordinated due to R hemi, decreased balance/postural control, generalized weakness  Trunk/Postural Assessment  Cervical Assessment Cervical Assessment: Within Functional Limits Thoracic Assessment Thoracic Assessment: Within Functional Limits Lumbar Assessment Lumbar Assessment: Ex[facility] to [facility](posterior pelvic tilt) Postural Control Postural Control: Deficits on evaluation  Balance Balance Balance Assessed: Yes Static Sitting Balance Static Sitting - Balance Support: Feet supported;Bilateral upper extremity supported Static Sitting - Level of Assistance: 5: Stand by assistance (supervision) Dynamic Sitting Balance Dynamic Sitting - Balance Support: Feet supported;No upper extremity supported Dynamic Sitting - Level of Assistance: 5: Stand by assistance Sitting balance - Comments: no balance issues with unsupported sitting Static Standing Balance Static Standing - Balance Support: No upper extremity supported Static Standing - Level of Assistance: 4: Min assist Dynamic Standing Balance Dynamic Standing - Balance Support: Left upper extremity supported;During functional activity (handrail) Dynamic Standing - Level of Assistance: 3: Mod assist Dynamic Standing - Comments: during ambulation Extremity/Trunk Assessment RUE Assessment RUE Assessment: Ex[facility] to [facility](R shoulder at rest exhibiting slight  scapular depression/protraction, internal rotation, and scapular winging) Passive Range of Motion (PROM) Comments: Abduction to 90 degrees, shoulder FLX 170 before pain requiring mobilization Active Range of Motion (AROM) Comments: Shoulder FLX 45 degrees, horizontal abduction 45 degrees, IR/ER over 75% AROM, elbow EXT decreased; forearm and wrist WFL LUE Assessment LUE Assessment: Within Functional Limits  Care Tool Care Tool Self Care Eating        Oral Care         Bathing              Upper Body Dressing(including orthotics)            Lower Body Dressing (excluding footwear)          Putting on/Taking off footwear             Care Tool Toileting Toileting activity         Care Tool Bed Mobility Roll left and right activity   Roll left and right assist level: Supervision/Verbal cueing    Sit to lying activity        Lying to sitting edge  of bed activity   Lying to sitting edge of bed assist level: Contact Guard/Touching assist     Care Tool Transfers Sit to stand transfer   Sit to stand assist level: Minimal Assistance - Patient > 75%    Chair/bed transfer   Chair/bed transfer assist level: Minimal Assistance - Patient > 75%     Toilet transfer         Care Tool Cognition Expression of Ideas and Wants Expression of Ideas and Wants: Without difficulty (complex and basic) - expresses complex messages without difficulty and with speech that is clear and easy to understand   Understanding Verbal and Non-Verbal Content Understanding Verbal and Non-Verbal Content: Understands (complex and basic) - clear comprehension without cues or repetitions   Memory/Recall Ability *first 3 days only      Refer to Care Plan for Long Term Goals  SHORT TERM GOAL WEEK 1 OT Short Term Goal 1 (Week 1): Patient will complete LB dressing/bathing using AE PRN with close spvsn. OT Short Term Goal 2 (Week 1): Patient will use RUE in BADL tasks at fine motor assist level  with min vc's. OT Short Term Goal 3 (Week 1): Patient will complete 3/3 toileting tasks with close spvsn. OT Short Term Goal 4 (Week 1): Patient will complete dynamic standing ADLs with close spvsn.  Recommendations for other services: Therapeutic Recreation  Outing/community reintegration   Skilled Therapeutic Intervention Evaluation completed as documented above. Excellent candidate for CIR to improve functional independence at home. Pt received sitting in w/c agreeable to OT evaluation. Pt presenting with RUE hemiplegia in PROM/AROM and gross functional assessments improving ROM following stretching/scapular mobilization, neuro reeducation and manipulation techniques (proximal inhibition/facilitation), and unsupported sitting targeting static/dynamic sitting balance to improve involvement of RUE in functional tasks to increase independence in ADLs and functional mobility. Patient education and assistance in safe use of RW and TTB. Pt left seated in w/c, alarm set, call bell in reach and all needs met.  ADL ADL Toilet Transfer: Contact guard;Minimal verbal cueing Toilet Transfer Method: Stand pivot Toilet Transfer Equipment: Raised toilet seat Tub/Shower Transfer: Minimal cueing;Contact guard Tub/Shower Transfer Method: Stand pivot Tub/Shower Equipment: Transfer tub bench ADL Comments: Pt had completed ADLs prior to OT eval session (was washed up, dressed, groomed) and requested shower tomorrow. Will inform OT for next session to complete ADL assessment/CareTool. Mobility  Bed Mobility Bed Mobility: Rolling Right;Right Sidelying to Sit Rolling Right: Supervision/verbal cueing Right Sidelying to Sit: Contact Guard/Touching assist Transfers Sit to Stand: Minimal Assistance - Patient > 75% Stand to Sit: Contact Guard/Touching assist   Discharge Criteria: Patient will be discharged from OT if patient refuses treatment 3 consecutive times without medical reason, if treatment goals not met,  if there is a change in medical status, if patient makes no progress towards goals or if patient is discharged from hospital.  The above assessment, treatment plan, treatment alternatives and goals were discussed and mutually agreed upon: by patient  Mellissa Kohut 03/06/2021, 2:31 PM

## 2021-03-06 NOTE — Progress Notes (Signed)
Inpatient Rehabilitation Center Individual Statement of Services  Patient Name:  Charles Callahan  Date:  03/06/2021  Welcome to the Inpatient Rehabilitation Center.  Our goal is to provide you with an individualized program based on your diagnosis and situation, designed to meet your specific needs.  With this comprehensive rehabilitation program, you will be expected to participate in at least 3 hours of rehabilitation therapies Monday-Friday, with modified therapy programming on the weekends.  Your rehabilitation program will include the following services:  Physical Therapy (PT), Occupational Therapy (OT), Speech Therapy (ST), 24 hour per day rehabilitation nursing, Neuropsychology, Care Coordinator, Rehabilitation Medicine, Nutrition Services and Pharmacy Services  Weekly team conferences will be held on Tuesday to discuss your progress.  Your Inpatient Rehabilitation Care Coordinator will talk with you frequently to get your input and to update you on team discussions.  Team conferences with you and your family in attendance may also be held.  Expected length of stay: 10-14 days Overall anticipated outcome: mod/i level  Depending on your progress and recovery, your program may change. Your Inpatient Rehabilitation Care Coordinator will coordinate services and will keep you informed of any changes. Your Inpatient Rehabilitation Care Coordinator's name and contact numbers are listed  below.  The following services may also be recommended but are not provided by the Inpatient Rehabilitation Center:   Driving Evaluations  Home Health Rehabiltiation Services  Outpatient Rehabilitation Services    Arrangements will be made to provide these services after discharge if needed.  Arrangements include referral to agencies that provide these services.  Your insurance has been verified to be:  Uninsured Your primary doctor is:  None  Pertinent information will be shared with your doctor and your  insurance company.  Inpatient Rehabilitation Care Coordinator:  Dossie Der, [place] [phone] or Luna Glasgow  Information discussed with and copy given to patient by: Lucy Chris, 03/06/2021, 11:11 AM

## 2021-03-06 NOTE — Progress Notes (Signed)
Patient information reviewed and entered into [facility] by Becky Krisalyn Yankowski, PPS coordinator. Information including medical coding, function ability, and quality indicators will be reviewed and updated through discharge.   

## 2021-03-06 NOTE — Evaluation (Signed)
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 CHF (congestive heart failure) ([place])   . Hypertension    Past Surgical History:  Past Surgical History:  Procedure Laterality Date  . EYE SURGERY      Assessment / Plan / Recommendation Clinical Impression Patient is a 50 y.o. year old male with history of diastolic congestive heart failure as well as hypertension and tobacco use. Presented 02/27/2021 with acute onset of right side weakness. Cranial CT scan showed no acute findings. Area of low density in the cerebral hemisphere white matter consistent with small vessel ischemic change. Patient did not receive tPA. 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 No associated hemorrhage or mass-effect. CT angiogram of the head and neck showed no hemodynamically significant stenosis of the neck however there was a 3-4 cm mass abutting or arising from the left thyroid lobe. Ultrasound of thyroid showed a solitary 3.7 cm right side thyroid nodule recommending percutaneous sampling which could be done as outpatient. Echocardiogram with ejection fraction of 55 to 60% no wall motion abnormalities grade 2 diastolic dysfunction. Admission chemistries unremarkable except sodium 133 urine drug screen negative alcohol negative urinalysis negative nitrite. Currently maintained on aspirin 325 mg daily and Plavix 75 mg daily for CVA prophylaxis x3 months then aspirin alone. Subcutaneous Lovenox for DVT prophylaxis. Tolerating a regular  diet. Therapy evaluations completed due to patient's right side weakness wasrecommendedfor a comprehensive rehab program  Pt presents with mild cognitive impairment as evidenced by SLUMS score of 22/30 (WFL = 27+). Pt does have an 8th grade education and reports being bad a math. Pt with difficulty during digit manipulation task, able to recall 4/5 words with delay, and able to recall 2/4 story elements. Pt endorses a mild cognitive decline and would like to be back to Mod I level cognition for safe d/c home.   Pt presents with very mild dysarthria, mitigated with cued use of compensatory strategies. Intelligibility at conversation level 90-100%, low volume mostly impacted intelligibility. Pt states people have difficulty hearing him on the phone since his CVA. Of note, patient reports occasional coughing/food sticking when consuming mixed solids and also while drinking water with head reclined in bed at night. Swallow screened, remains appropriate on regular/thin diet. Will target recall of standard swallow precautions to increase safety and comfort.    Skilled Therapeutic Interventions          Pt participating in [employer] (SLUMS) and further non-standardized assessments of speech and cognition. A swallow screen was administered.    SLP Assessment  Patient will need skilled [place] [facility] Services during CIR admission    Recommendations  Oral Care Recommendations: Oral care BID Recommendations for Other Services: Neuropsych consult Patient destination: Home Follow up Recommendations: None Equipment Recommended: None recommended by SLP    SLP Frequency 3 to 5 out of 7 days   SLP Duration  SLP Intensity  SLP Treatment/Interventions 9-14 days  Minumum of 1-2 x/day, 30 to 90 minutes  Cognitive remediation/compensation;Therapeutic Activities;Therapeutic Exercise;Functional tasks;Cueing hierarchy;Internal/external aids;Medication  managment;Patient/family education    Pain Pain Assessment  Pain Scale: 0-10 Pain Score: 0-No pain  Prior Functioning Cognitive/Linguistic Baseline: Baseline deficits Baseline deficit details: "im bad at math" - 8th grade education Type of Home: House  Lives With: Family Available Help at Discharge: Family;Available PRN/intermittently Vocation: Unemployed  SLP Evaluation Cognition Overall Cognitive Status: Within Functional Limits for tasks assessed Arousal/Alertness: Awake/alert Memory: Impaired Memory Impairment: Retrieval deficit Immediate Memory Recall: Blue;Sock;Bed Memory Recall Sock: Without Cue Memory Recall Blue: Without Cue Memory Recall Bed: With Cue Awareness: Appears intact Problem Solving: Impaired Problem Solving Impairment: Functional basic;Verbal basic Safety/Judgment: Appears intact  Comprehension Auditory Comprehension Overall Auditory Comprehension: Appears within functional limits for tasks assessed Expression Expression Primary Mode of Expression: Verbal Verbal Expression Overall Verbal Expression: Appears within functional limits for tasks assessed Written Expression Dominant Hand: Right Oral Motor Oral Motor/Sensory Function Overall Oral Motor/Sensory Function: Mild impairment Facial ROM: Reduced right;Suspected CN VII (facial) dysfunction Facial Symmetry: Abnormal symmetry right;Suspected CN VII (facial) dysfunction Facial Strength: Reduced right;Suspected CN VII (facial) dysfunction Facial Sensation: Within Functional Limits Lingual ROM: Reduced right;Suspected CN XII (hypoglossal) dysfunction Lingual Symmetry: Within Functional Limits Lingual Strength: Reduced;Suspected CN XII (hypoglossal) dysfunction Lingual Sensation: Within Functional Limits Velum: Within Functional Limits Mandible: Within Functional Limits Motor Speech Overall Motor Speech: Impaired Respiration: Within functional limits Phonation: Normal;Low vocal  intensity Resonance: Within functional limits Articulation: Impaired Level of Impairment: Sentence Intelligibility: Intelligibility reduced Word: 75-100% accurate Phrase: 75-100% accurate Sentence: 75-100% accurate Conversation: 75-100% accurate Motor Planning: Witnin functional limits Effective Techniques: Increased vocal intensity;Over-articulate  Care Tool Care Tool Cognition Expression of Ideas and Wants Expression of Ideas and Wants: Without difficulty (complex and basic) - expresses complex messages without difficulty and with speech that is clear and easy to understand   Understanding Verbal and Non-Verbal Content Understanding Verbal and Non-Verbal Content: Understands (complex and basic) - clear comprehension without cues or repetitions   Memory/Recall Ability *first 3 days only Memory/Recall Ability *first 3 days only: Current season;Location of own room;That he or she is in a hospital/hospital unit    Intelligibility: Intelligibility reduced Word: 75-100% accurate Phrase: 75-100% accurate Sentence: 75-100% accurate Conversation: 75-100% accurate  Short Term Goals: Week 1: SLP Short Term Goal 1 (Week 1): Pt will utilize over articulation and increased vocal intensity strategies with Supervision A SLP Short Term Goal 2 (Week 1): Pt will understand, recall and utilize standard swallow precautions with Supervision A SLP Short Term Goal 3 (Week 1): Pt will recall functional/complex information with 80% accuracy min A SLP Short Term Goal 4 (Week 1): Pt will complete medication management task with min A  Refer to Care Plan for Long Term Goals  Recommendations for other services: Neuropsych  Discharge Criteria: Patient will be discharged from SLP if patient refuses treatment 3 consecutive times without medical reason, if treatment goals not met, if there is a change in medical status, if patient makes no progress towards goals or if patient is discharged from hospital.  The  above assessment, treatment plan, treatment alternatives and goals were discussed and mutually agreed upon: by patient  Dewaine Conger 03/06/2021, 4:01 PM

## 2021-03-06 NOTE — Evaluation (Signed)
Physical Therapy Assessment and Plan  Patient Details  Name: BRANDIS MATSUURA MRN: [id number] Date of Birth: [birth year]  PT Diagnosis: Abnormal posture, Abnormality of gait, Difficulty walking, Hemiparesis dominant, Impaired sensation, Muscle weakness and Pain in R shoulder and R posterior knee Rehab Potential: Good ELOS: 12-14 days   Today's Date: 03/06/2021 PT Individual Time: 0867-6195 PT Individual Time Calculation (min): 69 min    Hospital Problem: Principal Problem:   Subcortical infarction [facility])   Past Medical History:  Past Medical History:  Diagnosis Date  . CHF (congestive heart failure) ([place])   . Hypertension    Past Surgical History:  Past Surgical History:  Procedure Laterality Date  . EYE SURGERY      Assessment & Plan Clinical Impression: Patient is a 50 y.o. year old male with history of diastolic congestive heart failure as well as hypertension and tobacco use. Presented 02/27/2021 with acute onset of right side weakness.  Cranial CT scan showed no acute findings.  Area of low density in the cerebral hemisphere white matter consistent with small vessel ischemic change.  Patient did not receive tPA.  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  No associated hemorrhage or mass-effect.  CT angiogram of the head and neck showed no hemodynamically significant stenosis of the neck however there was a 3-4 cm mass abutting or arising from the left thyroid lobe.  Ultrasound of thyroid showed a solitary 3.7 cm right side thyroid nodule recommending percutaneous sampling which could be done as outpatient.  Echocardiogram with ejection fraction of 55 to 60% no wall motion abnormalities grade 2 diastolic dysfunction.  Admission chemistries unremarkable except sodium 133 urine drug screen negative alcohol negative urinalysis negative nitrite.  Currently maintained on aspirin 325 mg daily and Plavix 75 mg daily for CVA  prophylaxis x3 months then aspirin alone.  Subcutaneous Lovenox for DVT prophylaxis.  Tolerating a regular diet.  Therapy evaluations completed due to patient's right side weakness was recommended for a comprehensive rehab program.   Patient currently requires mod with mobility secondary to muscle weakness, decreased cardiorespiratoy endurance, impaired timing and sequencing, abnormal tone, decreased coordination and decreased motor planning and decreased standing balance, decreased postural control, hemiplegia and decreased balance strategies.  Prior to hospitalization, patient was independent  with mobility and lived with Family (lives with mother; daughter can provide intermittent assist.) in a House home.  Home access is  Level entry.  Patient will benefit from skilled PT intervention to maximize safe functional mobility, minimize fall risk and decrease caregiver burden for planned discharge home with intermittent assist.  Anticipate patient will benefit from follow up OP at discharge.  PT - End of Session Activity Tolerance: Tolerates 30+ min activity with multiple rests Endurance Deficit: Yes Endurance Deficit Description: reported RLE fatigue throughout PT Assessment Rehab Potential (ACUTE/IP ONLY): Good PT Barriers to Discharge: Decreased caregiver support;Lack of/limited family support PT Barriers to Discharge Comments: primary caregiver for his mother PT Patient demonstrates impairments in the following area(s): Balance;Endurance;Motor;Pain;Sensory;Safety PT Transfers Functional Problem(s): Bed Mobility;Bed to Chair;Car;Furniture PT Locomotion Functional Problem(s): Ambulation;Wheelchair Mobility;Stairs PT Plan PT Intensity: Minimum of 1-2 x/day ,45 to 90 minutes PT Frequency: 5 out of 7 days PT Duration Estimated Length of Stay: 12-14 days PT Treatment/Interventions: Ambulation/gait training;Discharge planning;Functional mobility training;Psychosocial support;Therapeutic  Activities;Balance/vestibular training;Disease management/prevention;Neuromuscular re-education;Therapeutic Exercise;Wheelchair propulsion/positioning;Cognitive remediation/compensation;DME/adaptive equipment instruction;Pain management;Splinting/orthotics;UE/LE Strength taining/ROM;Community reintegration;Functional electrical stimulation;Patient/family education;Stair training;UE/LE Coordination activities PT Transfers Anticipated Outcome(s): Mod I PT Locomotion Anticipated Outcome(s): Mod I PT  Recommendation Follow Up Recommendations: Outpatient PT Patient destination: Home Equipment Recommended: To be determined Equipment Details: has none  PT Evaluation Precautions/Restrictions Precautions Precautions: Fall Precaution Comments: R hemi Restrictions Weight Bearing Restrictions: No Home Living/Prior Functioning Home Living Living Arrangements: Parent Available Help at Discharge: Family;Available PRN/intermittently Type of Home: House Home Access: Level entry Home Layout: One level Bathroom Shower/Tub: Chiropodist: Standard Bathroom Accessibility: Yes  Lives With: Family (lives with mother; daughter can provide intermittent assist.) Prior Function Level of Independence: Independent with basic ADLs;Independent with transfers;Independent with homemaking with ambulation;Independent with gait  Able to Take Stairs?: Yes Driving: Yes Vocation: Unemployed Vocation Requirements: took time off work to take care of his mother Cognition Overall Cognitive Status: Within Functional Limits for tasks assessed Arousal/Alertness: Awake/alert Orientation Level: Oriented X4 Memory: Appears intact Awareness: Appears intact Problem Solving: Impaired Safety/Judgment: Appears intact Sensation Sensation Light Touch: Impaired by gross assessment Proprioception: Appears Intact Additional Comments: decreased sensation along RLE Coordination Gross Motor Movements are Fluid and  Coordinated: No Fine Motor Movements are Fluid and Coordinated: No Coordination and Movement Description: grossly uncoordinated due to R hemi, decreased balance/postural control, generalized weakness Finger Nose Finger Test: decreased ROM and dysmetria on RUE Heel Shin Test: difficulty performing on RLE Motor  Motor Motor: Hemiplegia;Abnormal postural alignment and control Motor - Skilled Clinical Observations: grossly uncoordinated due to R hemi, decreased balance/postural control, generalized weakness  Trunk/Postural Assessment  Cervical Assessment Cervical Assessment: Within Functional Limits Thoracic Assessment Thoracic Assessment: Within Functional Limits Lumbar Assessment Lumbar Assessment: Exceptions to [facility] (posterior pelvic tilt) Postural Control Postural Control: Deficits on evaluation  Balance Balance Balance Assessed: Yes Static Sitting Balance Static Sitting - Balance Support: Feet supported;Bilateral upper extremity supported Static Sitting - Level of Assistance: 5: Stand by assistance (supervision) Dynamic Sitting Balance Dynamic Sitting - Balance Support: Feet supported;No upper extremity supported Dynamic Sitting - Level of Assistance: 5: Stand by assistance (supervision) Static Standing Balance Static Standing - Balance Support: No upper extremity supported Static Standing - Level of Assistance: 4: Min assist Dynamic Standing Balance Dynamic Standing - Balance Support: Left upper extremity supported;During functional activity (handrail) Dynamic Standing - Level of Assistance: 3: Mod assist Dynamic Standing - Comments: during ambulation Extremity Assessment  RLE Assessment RLE Assessment: Exceptions to [facility] RLE Strength Right Hip Flexion: 3-/5 Right Hip ADduction: 3-/5 Right Knee Flexion: 3-/5 Right Knee Extension: 2+/5 Right Ankle Dorsiflexion: 3-/5 Right Ankle Plantar Flexion: 3-/5 LLE Assessment LLE Assessment: Exceptions to [facility] General Strength  Comments: grossly generalized to 4-/5  Care Tool Care Tool Bed Mobility Roll left and right activity   Roll left and right assist level: Supervision/Verbal cueing    Sit to lying activity        Lying to sitting edge of bed activity   Lying to sitting edge of bed assist level: Contact Guard/Touching assist     Care Tool Transfers Sit to stand transfer   Sit to stand assist level: Minimal Assistance - Patient > 75%    Chair/bed transfer   Chair/bed transfer assist level: Minimal Assistance - Patient > 75%     Physiological scientist transfer assist level: Minimal Assistance - Patient > 75%      Care Tool Locomotion Ambulation   Assist level: Moderate Assistance - Patient 50 - 74% Assistive device: Other (comment) (handrail) Max distance: 54f  Walk 10 feet activity   Assist level: Moderate Assistance - Patient - 563-  74% Assistive device: Other (comment) (handrail)   Walk 50 feet with 2 turns activity   Assist level: Moderate Assistance - Patient - 50 - 74% Assistive device: Other (comment) (handrail)  Walk 150 feet activity Walk 150 feet activity did not occur: Safety/medical concerns (fatigue, R hemi, decreased balance, weakness)      Walk 10 feet on uneven surfaces activity   Assist level: Moderate Assistance - Patient - 50 - 74% Assistive device: Other (comment) (handrail)  Stairs   Assist level: Moderate Assistance - Patient - 50 - 74% Stairs assistive device: 2 hand rails Max number of stairs: 4  Walk up/down 1 step activity   Walk up/down 1 step (curb) assist level: Moderate Assistance - Patient - 50 - 74% Walk up/down 1 step or curb assistive device: 2 hand rails    Walk up/down 4 steps activity Walk up/down 4 steps assist level: Moderate Assistance - Patient - 50 - 74% Walk up/down 4 steps assistive device: 2 hand rails  Walk up/down 12 steps activity Walk up/down 12 steps activity did not occur: Safety/medical concerns (fatigue, R  hemi, decreased balance, weakness)      Pick up small objects from floor Pick up small object from the floor (from standing position) activity did not occur: Safety/medical concerns (R hemi, decreased balance, weakness)      Wheelchair Will patient use wheelchair at discharge?: No Type of Wheelchair: Manual   Wheelchair assist level: Supervision/Verbal cueing Max wheelchair distance: 160f  Wheel 50 feet with 2 turns activity   Assist Level: Supervision/Verbal cueing  Wheel 150 feet activity   Assist Level: Supervision/Verbal cueing    Refer to Care Plan for Long Term Goals  SHORT TERM GOAL WEEK 1 PT Short Term Goal 1 (Week 1): pt will perform bed mobility with supervision PT Short Term Goal 2 (Week 1): pt will perform bed<>chair transfers with LRAD and CGA PT Short Term Goal 3 (Week 1): pt will ambulate 553fwith LRAD and CGA  Recommendations for other services: None   Skilled Therapeutic Intervention Evaluation completed (see details above and below) with education on PT POC and goals and individual treatment initiated with focus on functional mobility/transfers, generalized strengthening, dynamic standing balance/coordination, ambulation, stair navigation, simulated car transfers, and improved activity tolerance. Received pt supine in bed, educated pt on PT evaluation, CIR policies, and therapy schedule and agreeable. Pt reported pain 5/10 in R shoulder and posterior knee; RN notified and present to administer medications. Pt transferred supine<>sitting EOB with HOB elevated and use of bedrails with CGA. Transferred stand<>pivot bed<>WC without AD and min A. Pt performed WC mobility [id number] 1 and [id number] using LLE only and supervision. Pt then ambulated [id number] 1 handrail and mod A. Pt demonstrates decreased weight shifting to R, decreased R step/stride length, narrow BOS, and decreased cadence but no buckling noted. Pt performed simulated car transfer without AD and min A with  heavy reliance on UEs to lift RLE into car. Pt then ambulated 10ft 5fneven surfaces (ramp) with 1 handrail and mod A demonstrating same gait impairments. Pt navigated 4 steps with 2 rails and mod A with cues for "up with the good, down with the bad" technique. Returned to room and pt requested to remain in WC. [facility] with pt sitting in WC, needs within reach, and chair pad alarm on. Safety plan updated.   Mobility Bed Mobility Bed Mobility: Rolling Right;Right Sidelying to Sit Rolling Right: Supervision/verbal cueing Right Sidelying to Sit:  Contact Guard/Touching assist Transfers Transfers: Sit to Stand;Stand to Sit;Stand Pivot Transfers Sit to Stand: Minimal Assistance - Patient > 75% Stand to Sit: Contact Guard/Touching assist Stand Pivot Transfers: Minimal Assistance - Patient > 75% Stand Pivot Transfer Details: Verbal cues for sequencing;Verbal cues for technique;Verbal cues for precautions/safety Stand Pivot Transfer Details (indicate cue type and reason): verbal cues for hand placement when turning Transfer (Assistive device): None Locomotion  Gait Ambulation: Yes Gait Assistance: Moderate Assistance - Patient 50-74% Gait Distance (Feet): 50 Feet Assistive device: Other (Comment) (handrail) Gait Assistance Details: Verbal cues for technique;Verbal cues for sequencing;Verbal cues for gait pattern Gait Assistance Details: verbal cues to relax RUE, for R weight shfiting, and to increased R step length Gait Gait: Yes Gait Pattern: Impaired Gait Pattern: Step-to pattern;Decreased stance time - right;Decreased dorsiflexion - right;Decreased trunk rotation;Decreased stride length;Decreased weight shift to right;Decreased step length - right;Decreased step length - left;Decreased hip/knee flexion - right;Poor foot clearance - left;Narrow base of support;Poor foot clearance - right Gait velocity: decreased Stairs / Additional Locomotion Stairs: Yes Stairs Assistance: Moderate  Assistance - Patient 50 - 74% Stair Management Technique: Two rails Number of Stairs: 4 Height of Stairs: 6 Ramp: Moderate Assistance - Patient 50 - 74% (handrail) Wheelchair Mobility Wheelchair Mobility: Yes Wheelchair Assistance: Chartered loss adjuster: Left lower extremity Wheelchair Parts Management: Needs assistance Distance: 120f   Discharge Criteria: Patient will be discharged from PT if patient refuses treatment 3 consecutive times without medical reason, if treatment goals not met, if there is a change in medical status, if patient makes no progress towards goals or if patient is discharged from hospital.  The above assessment, treatment plan, treatment alternatives and goals were discussed and mutually agreed upon: by patient  AAlfonse AlpersPT, DPT  03/06/2021, 12:07 PM

## 2021-03-06 NOTE — Progress Notes (Signed)
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 CHF (congestive heart failure) (HCC)   . Hypertension    Past Surgical History:  Past Surgical History:  Procedure Laterality Date  . EYE SURGERY     Social History:  reports that he has been smoking cigarettes. He has been smoking about 1.00 pack per day. He has never used smokeless tobacco. He reports current alcohol use. He reports current drug use. Drug: Cocaine.  Family / Support Systems Marital Status: Separated Patient Roles: Parent Children: Courtney-daughter [phone]-cell Other Supports: Mom whom he lives with Anticipated Caregiver: Patient-daughter can come by and check on him Ability/Limitations of Caregiver: Daughter works and can not provide 24/7 care Caregiver Availability: Intermittent Family Dynamics: Close with daughter and Mom. Pt's Mom has health issues and can not assist him. Pt reports he quit work to assist her at home. 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 He is encouraged by his  progress already and has not had much therapy yet. Recent Psychosocial Issues: other health issues Psychiatric History: no history deferred depression screening due to seems to be coping appropriately, but would benefit from seeing neuro-psych while here Substance Abuse History: Tobacco, cocaine and ETOH-pt has quit all of these and started again, needs assist and will give community resources for  Patient / Family Perceptions, Expectations & Goals Pt/Family understanding of illness & functional limitations: Pt is able to explain his stroke and deficits. He talks with the MD and feels his questions and concerns are being addressed. He hopes to be mod/i by discharge. Premorbid pt/family roles/activities: Son, father, sibling, friend, etc Anticipated changes in roles/activities/participation: resume Pt/family expectations/goals: Pt states: " I want to be able to take care of myself when I leave here."  [employer]: None Premorbid Home Care/DME Agencies: None Transportation available at discharge: can drive, sister transports to appointments Resource referrals recommended: Neuropsychology  Discharge Planning Living Arrangements: Parent Support Systems: Tax inspector Type of Residence: Private residence Insurance Resources: Customer service manager Resources: Family Support Financial Screen Referred: Yes Living Expenses: Lives with family Money Management: Family Does the patient have any problems obtaining your medications?: Yes (Describe) (uninsured) Home Management: Mom and pt helps with Patient/Family Preliminary Plans: Return to Mom';s home and hopefully assist her like he was. She does not require physical assist. Aware team evaluationg and setting goals today. Will connect with Clinic for PCP and medication assist prior to DC Care Coordinator Barriers to Discharge: Decreased caregiver support,Medication compliance,Insurance for  SNF coverage Care Coordinator Barriers to Discharge Comments: Will need to be mod/i has no caregiver at home, uninsured and has no coverage for alternative DC plan Care Coordinator Anticipated Follow Up Needs: HH/OP  Clinical Impression Pleasant gentleman who  is motivated to improve and attend therapies. He realizes he needs to be mod/i to be able to go home with Mom safely, she can not assist him. His daughter can check on him but not provide care.Will get connected to Clinic for PCP follow and medication management and he needs to stop his substances and will be seen by neuro-psych while here  Olander Friedl, Lemar Livings 03/06/2021, 11:09 AM

## 2021-03-06 NOTE — Plan of Care (Signed)
Problem: RH Balance Goal: LTG Patient will maintain dynamic standing with ADLs (OT) Description: LTG:  Patient will maintain dynamic standing balance with assist during activities of daily living (OT)  Flowsheets (Taken 03/06/2021 1418) LTG: Pt will maintain dynamic standing balance during ADLs with: Independent with assistive device   Problem: Sit to Stand Goal: LTG:  Patient will perform sit to stand in prep for activites of daily living with assistance level (OT) Description: LTG:  Patient will perform sit to stand in prep for activites of daily living with assistance level (OT) Flowsheets (Taken 03/06/2021 1418) LTG: PT will perform sit to stand in prep for activites of daily living with assistance level: Independent with assistive device   Problem: RH Grooming Goal: LTG Patient will perform grooming w/assist,cues/equip (OT) Description: LTG: Patient will perform grooming with assist, with/without cues using equipment (OT) Flowsheets (Taken 03/06/2021 1418) LTG: Pt will perform grooming with assistance level of: Independent with assistive device    Problem: RH Bathing Goal: LTG Patient will bathe all body parts with assist levels (OT) Description: LTG: Patient will bathe all body parts with assist levels (OT) Flowsheets (Taken 03/06/2021 1418) LTG: Pt will perform bathing with assistance level/cueing: Independent with assistive device    Problem: RH Dressing Goal: LTG Patient will perform upper body dressing (OT) Description: LTG Patient will perform upper body dressing with assist, with/without cues (OT). Flowsheets (Taken 03/06/2021 1418) LTG: Pt will perform upper body dressing with assistance level of: Independent with assistive device Goal: LTG Patient will perform lower body dressing w/assist (OT) Description: LTG: Patient will perform lower body dressing with assist, with/without cues in positioning using equipment (OT) Flowsheets (Taken 03/06/2021 1418) LTG: Pt will perform  lower body dressing with assistance level of: Independent with assistive device   Problem: RH Toileting Goal: LTG Patient will perform toileting task (3/3 steps) with assistance level (OT) Description: LTG: Patient will perform toileting task (3/3 steps) with assistance level (OT)  Flowsheets (Taken 03/06/2021 1418) LTG: Pt will perform toileting task (3/3 steps) with assistance level: Independent with assistive device   Problem: RH Functional Use of Upper Extremity Goal: LTG Patient will use RT/LT upper extremity as a (OT) Description: LTG: Patient will use right/left upper extremity as a stabilizer/gross assist/diminished/nondominant/dominant level with assist, with/without cues during functional activity (OT) Flowsheets (Taken 03/06/2021 1418) LTG: Use of upper extremity in functional activities: RUE as dominant level LTG: Pt will use upper extremity in functional activity with assistance level of: Independent with assistive device   Problem: RH Light Housekeeping Goal: LTG Patient will perform light housekeeping w/assist (OT) Description: LTG: Patient will perform light housekeeping with assistance, with/without cues (OT). Flowsheets (Taken 03/06/2021 1418) LTG: Pt will perform light housekeeping with assistance level of: Independent with assistive device   Problem: RH Toilet Transfers Goal: LTG Patient will perform toilet transfers w/assist (OT) Description: LTG: Patient will perform toilet transfers with assist, with/without cues using equipment (OT) Flowsheets (Taken 03/06/2021 1418) LTG: Pt will perform toilet transfers with assistance level of: Independent with assistive device   Problem: RH Tub/Shower Transfers Goal: LTG Patient will perform tub/shower transfers w/assist (OT) Description: LTG: Patient will perform tub/shower transfers with assist, with/without cues using equipment (OT) Flowsheets (Taken 03/06/2021 1418) LTG: Pt will perform tub/shower stall transfers with  assistance level of: Independent with assistive device LTG: Pt will perform tub/shower transfers from: Tub/shower combination   Problem: RH Furniture Transfers Goal: LTG Patient will perform furniture transfers w/assist (OT/PT) Description: LTG: Patient will perform furniture transfers  with assistance (OT/PT). Flowsheets (Taken 03/06/2021 1418) LTG: Pt will perform furniture transfers with assist:: Independent with assistive device

## 2021-03-06 NOTE — Progress Notes (Signed)
PROGRESS NOTE   Subjective/Complaints: No complaints this morning. Denies pain, constipation, insomnia.  BP went up to 180s at home- 150s systolic here- discussed increasing lisinopril to 5mg   ROS: denies pain Objective:   No results found. Recent Labs    03/05/21 0416 03/06/21 0549  WBC 4.9 4.8  HGB 14.9 14.6  HCT 44.8 43.7  PLT 241 236   Recent Labs    03/05/21 0309 03/06/21 0549  NA 133* 132*  K 4.1 4.3  CL 103 102  CO2 26 26  GLUCOSE 100* 97  BUN 16 16  CREATININE 1.00 0.99  CALCIUM 11.4* 10.9*    Intake/Output Summary (Last 24 hours) at 03/06/2021 0857 Last data filed at 03/06/2021 0815 Gross per 24 hour  Intake 440 ml  Output 1140 ml  Net -700 ml        Physical Exam: Vital Signs Blood pressure (!) 150/87, pulse (!) 52, temperature 98.6 F (37 C), resp. rate 17, SpO2 97 %. Gen: no distress, normal appearing HEENT: oral mucosa pink and moist, NCAT Cardio: Bradycardia Chest: normal effort, normal rate of breathing Abd: soft, non-distended Ext: no edema Psych: pleasant, normal affect Skin: intact Musculoskeletal:     Cervical back: Normal range of motion and neck supple.     Comments: No edema or tenderness in extremities  Skin:    General: Skin is warm and dry.  Neurological:     Mental Status: He is alert.     Comments: Alert and oriented Makes eye contact with examiner.   Follows commands.   Fair insight and awareness. Motor: LUE/LE: 5/5 proximal distal RUE: Shoulder abduction 2+/5 distally 2-/5 RLE: 3-/5 HF, KE, 1/5 ADF  Psychiatric:        Mood and Affect: Mood normal.        Behavior: Behavior normal.    Assessment/Plan: 1. Functional deficits which require 3+ hours per day of interdisciplinary therapy in a comprehensive inpatient rehab setting.  Physiatrist is providing close team supervision and 24 hour management of active medical problems listed below.  Physiatrist and  rehab team continue to assess barriers to discharge/monitor patient progress toward functional and medical goals  Care Tool:  Bathing              Bathing assist       Upper Body Dressing/Undressing Upper body dressing        Upper body assist      Lower Body Dressing/Undressing Lower body dressing            Lower body assist       Toileting Toileting    Toileting assist Assist for toileting: Minimal Assistance - Patient > 75%     Transfers Chair/bed transfer  Transfers assist     Chair/bed transfer assist level: Minimal Assistance - Patient > 75%     Locomotion Ambulation   Ambulation assist              Walk 10 feet activity   Assist           Walk 50 feet activity   Assist           Walk 150 feet activity  Assist           Walk 10 feet on uneven surface  activity   Assist           Wheelchair     Assist               Wheelchair 50 feet with 2 turns activity    Assist            Wheelchair 150 feet activity     Assist          Blood pressure (!) 150/87, pulse (!) 52, temperature 98.6 F (37 C), resp. rate 17, SpO2 97 %.  Medical Problem List and Plan: 1.  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             -patient may shower             -ELOS/Goals: 9-13 days/supervision/Mod I             Initial CIR evals today.  2.  Impaired mobility: Continue Lovenox             -antiplatelet therapy: Aspirin 325 mg daily and Plavix 75 mg daily x3 months then aspirin alone 3. Right shoulder pain: Continue lidoderm patch 4. Mood: Provide emotional support             -antipsychotic agents: N/A 5. Neuropsych: This patient is capable of making decisions on her own behalf. 6. Skin/Wound Care: Routine skin checks 7. Fluids/Electrolytes/Nutrition: Routine in and outs. Discussed results of CMP with  patient.  8.  Hypertension.  Norvasc 10 mg daily, increase lisinopril to 5 mg daily.               Monitor with increased mobility 9.  Hyperlipidemia: Lipitor 10.  History of diastolic congestive heart failure.               Monitor for signs and symptoms of fluid overload 11.  Tobacco abuse.  NicoDerm patch.  Provide counseling 12.  Thyroid nodule.  Incidental finding.  Outpatient percutaneous sampling.   LOS: 1 days A FACE TO FACE EVALUATION WAS PERFORMED  Drema Pry Deshon Hsiao 03/06/2021, 8:57 AM

## 2021-03-07 DIAGNOSIS — E46 Unspecified protein-calorie malnutrition: Secondary | ICD-10-CM

## 2021-03-07 DIAGNOSIS — E8809 Other disorders of plasma-protein metabolism, not elsewhere classified: Secondary | ICD-10-CM

## 2021-03-07 DIAGNOSIS — G8191 Hemiplegia, unspecified affecting right dominant side: Secondary | ICD-10-CM

## 2021-03-07 DIAGNOSIS — E871 Hypo-osmolality and hyponatremia: Secondary | ICD-10-CM

## 2021-03-07 DIAGNOSIS — I10 Essential (primary) hypertension: Secondary | ICD-10-CM

## 2021-03-07 LAB — PTH-RELATED PEPTIDE: PTH-related peptide: <2 pmol/L

## 2021-03-07 MED ORDER — ONDANSETRON HCL 4 MG PO TABS: 2.0000 mg | ORAL_TABLET | Freq: Three times a day (TID) | ORAL | Status: DC | PRN

## 2021-03-07 MED ORDER — LISINOPRIL 10 MG PO TABS: 10.0000 mg | ORAL_TABLET | Freq: Every day | ORAL | Status: DC

## 2021-03-07 MED ORDER — PROSOURCE PLUS PO LIQD
30.0000 mL | Freq: Two times a day (BID) | ORAL | Status: DC
  Administered 2021-03-07 – 2021-03-10 (×7): 30 mL via ORAL
  Filled 2021-03-07 (×6): qty 30

## 2021-03-07 MED ORDER — LISINOPRIL 5 MG PO TABS
5.0000 mg | ORAL_TABLET | Freq: Every day | ORAL | Status: DC
  Administered 2021-03-08 – 2021-03-10 (×3): 5 mg via ORAL
  Filled 2021-03-07 (×3): qty 1

## 2021-03-07 NOTE — Progress Notes (Signed)
Speech Language Pathology Daily Session Note  Patient Details  Name: Charles Callahan MRN: [id number] Date of Birth: 03-22-1971  Today's Date: 03/07/2021 SLP Individual Time: 1000-1030 SLP Individual Time Calculation (min): 30 min  Short Term Goals: Week 1: SLP Short Term Goal 1 (Week 1): Pt will utilize over articulation and increased vocal intensity strategies with Supervision A SLP Short Term Goal 2 (Week 1): Pt will understand, recall and utilize standard swallow precautions with Supervision A SLP Short Term Goal 3 (Week 1): Pt will recall functional/complex information with 80% accuracy min A SLP Short Term Goal 4 (Week 1): Pt will complete medication management task with min A  Skilled Therapeutic Interventions:   Patient seen for skilled ST session to address cognitive function goals. Patient very engaged and appears very motivated during session. SLP made medication list for patient of his current prescribed medications and directed him to use phone to look up each medication to determine reason for taking it. He was able to perform internet search with supervision to minimal assist cues. He independently problem solved difficulty with typing with fingers by using voice search function. He required min-mod cues to find key words in medication descriptions to yield a simplified explanation, "treats high blood pressue", etc. Patient continues to benefit from skilled SLP intervention to maximize cognitive function prior to discharge.  Pain Pain Assessment Pain Scale: 0-10 Pain Score: 0-No pain  Therapy/Group: Individual Therapy  Angela Nevin, MA, CCC-SLP Speech Therapy

## 2021-03-07 NOTE — Progress Notes (Signed)
Occupational Therapy Session Note  Patient Details  Name: Charles Callahan MRN: [id number] Date of Birth: [birth year]  Today's Date: 03/07/2021 OT Individual Time: 0815-0902 OT Individual Time Calculation (min): 47 min    Short Term Goals: Week 1:  OT Short Term Goal 1 (Week 1): Patient will complete LB dressing/bathing using AE PRN with close spvsn. OT Short Term Goal 2 (Week 1): Patient will use RUE in BADL tasks at fine motor assist level with min vc's. OT Short Term Goal 3 (Week 1): Patient will complete 3/3 toileting tasks with close spvsn. OT Short Term Goal 4 (Week 1): Patient will complete dynamic standing ADLs with close spvsn.  Skilled Therapeutic Interventions/Progress Updates:    Treatment session with focus on self-care retraining, functional transfers, and functional use of dominant RUE.  Pt received supine in bed reporting nausea, but agreeable to therapy session and shower.  Pt completed bed mobility with CGA and ambulated to room shower with RW with min assist.  Pt completed step over shower ledge with min assist and physical assistance to lift RLE over 6" ledge.  Pt completed bathing at overall CGA when standing to wash buttocks.  Pt demonstrating good use of RUE during bathing with ability to wash UB and maintain grasp on wash cloth.  Pt completed dressing with setup assist at sit > stand level in room shower.  Pt required min assist when threading RLE and when donning R sock.  Pt able to complete UB dressing with increased time.  Pt completed oral care in sitting at sink.  Pt remained upright in w/c with chair alarm on and all needs in reach.  Therapy Documentation Precautions:  Precautions Precautions: Fall Precaution Comments: R hemi Restrictions Weight Bearing Restrictions: No General:   Vital Signs: Therapy Vitals BP: (!) 165/85 Pain: Pain Assessment Pain Scale: 0-10 Pain Score: 0-No pain   Therapy/Group: Individual Therapy  Rosalio Loud 03/07/2021, 9:07 AM

## 2021-03-07 NOTE — Progress Notes (Signed)
Physical Therapy Session Note  Patient Details  Name: Charles Callahan MRN: [id number] Date of Birth: June 27, 1971  Today's Date: 03/07/2021 PT Individual Time: 1300-1353 PT Individual Time Calculation (min): 53 min   Short Term Goals: Week 1:  PT Short Term Goal 1 (Week 1): pt will perform bed mobility with supervision PT Short Term Goal 2 (Week 1): pt will perform bed<>chair transfers with LRAD and CGA PT Short Term Goal 3 (Week 1): pt will ambulate 34ft with LRAD and CGA  Skilled Therapeutic Interventions/Progress Updates:   Received pt sitting in WC, pt agreeable to therapy, and denied any pain during session but reported mild discomfort in R posterior knee. Session with emphasis on functional mobility/transfers, generalized strengthening, dynamic standing balance/coordination, gait training, and improved activity tolerance. Pt transported to 4W dayroom in [facility] total A for time management purposes. Sit<>stand with min A and donned LiteGait harness standing with BUE support and max A. Pt stepped on/off treadmill with min A and cues for "up with the good, down with the bad". Worked on gait training on LiteGait Treadmill for the following time frames: -4 minutes and 8 seconds for 14ft at 0.45mph with BUE support and manual facilitation to prevent R knee hyperextension. Tactile cues to keep RLE on R side of dotted line and verbal cues to increase stride length and for upright posture/gaze. -5 minutes and 2 seconds for 277ft at 0.57mph with BUE support. Pt with improved ability to control R knee hyperextension with mod cues but required manual assist towards end due to increased fatigue.  -6 minutes and 30 seconds for 25ft at incline 3 at 0.5 mph with BUE support. Pt with minimal hyperextension (~25% of the time) Pt transported back to room in [facility] total A. Concluded session with pt sitting in WC, needs within reach, and chair pad alarm on. Provided pt with fresh ice water and snack.   Therapy  Documentation Precautions:  Precautions Precautions: Fall Precaution Comments: R hemi Restrictions Weight Bearing Restrictions: No  Therapy/Group: Individual Therapy Martin Majestic PT, DPT   03/07/2021, 7:24 AM

## 2021-03-07 NOTE — Progress Notes (Signed)
Physical Therapy Session Note  Patient Details  Name: Charles Callahan MRN: [id number] Date of Birth: 08/27/1971  Today's Date: 03/07/2021 PT Individual Time: 1117-1202 PT Individual Time Calculation (min): 45 min   Short Term Goals: Week 1:  PT Short Term Goal 1 (Week 1): pt will perform bed mobility with supervision PT Short Term Goal 2 (Week 1): pt will perform bed<>chair transfers with LRAD and CGA PT Short Term Goal 3 (Week 1): pt will ambulate 42ft with LRAD and CGA  Skilled Therapeutic Interventions/Progress Updates: Pt presents in w/c and agreeable to therapy.  Pt wheels self on elevator and to main gym.  Pt performed standing hooking horseshoes over basketball hoop using left UE at increased height, R UE from lower height.  Pt performed multiple sit to stand transfers w/o UE support 3 x 15-6.  Pt amb using hand rail LUE 40' and min A, and then w/ R hand.  Pt requires verbal cues for posture, visual scanning, and RLE advancement, especially HS as IC.  Walker splint applied to walker on R and amb > 40' w/ improved advancement of RLE.  Pt returned to w/c and seat alarm on w/ all needs in reach.     Therapy Documentation Precautions:  Precautions Precautions: Fall Precaution Comments: R hemi Restrictions Weight Bearing Restrictions: No General:   Vital Signs:  Pain: 6/10 R hip      Therapy/Group: Individual Therapy  Lucio Edward 03/07/2021, 2:00 PM

## 2021-03-07 NOTE — Progress Notes (Signed)
PROGRESS NOTE   Subjective/Complaints: Patient seen sitting up in his chair this AM, working with therapies.  He states he slept well overnight, but has nausea this AM - he state this is not limiting him.    ROS: +Nausea. Denies CP, SOB, V/D  Objective:   No results found. Recent Labs    03/05/21 0416 03/06/21 0549  WBC 4.9 4.8  HGB 14.9 14.6  HCT 44.8 43.7  PLT 241 236   Recent Labs    03/05/21 0309 03/06/21 0549  NA 133* 132*  K 4.1 4.3  CL 103 102  CO2 26 26  GLUCOSE 100* 97  BUN 16 16  CREATININE 1.00 0.99  CALCIUM 11.4* 10.9*    Intake/Output Summary (Last 24 hours) at 03/07/2021 0925 Last data filed at 03/07/2021 0735 Gross per 24 hour  Intake 416 ml  Output 1550 ml  Net -1134 ml        Physical Exam: Vital Signs Blood pressure (!) 165/85, pulse (!) 54, temperature 97.7 F (36.5 C), temperature source Oral, resp. rate 17, SpO2 96 %. Constitutional: No distress . Vital signs reviewed. HENT: Normocephalic.  Atraumatic. Eyes: EOMI. No discharge. Cardiovascular: No JVD.  RRR. Respiratory: Normal effort.  No stridor.  Bilateral clear to auscultation. GI: Non-distended.  BS +. Skin: Warm and dry.  Intact. Psych: Normal mood.  Normal behavior. Musc: No edema in extremities.  No tenderness in extremities. Neuro: Alert Makes eye contact with examiner.   Follows commands.   Fair insight and awareness. Motor: LUE/LE: 5/5 proximal distal RUE: Shoulder abduction 3-/5 distally 2-/5 RLE: 3-/5 HF, KE, 1+/5 ADF   Assessment/Plan: 1. Functional deficits which require 3+ hours per day of interdisciplinary therapy in a comprehensive inpatient rehab setting.  Physiatrist is providing close team supervision and 24 hour management of active medical problems listed below.  Physiatrist and rehab team continue to assess barriers to discharge/monitor patient progress toward functional and medical goals  Care  Tool:  Bathing    Body parts bathed by patient: Right arm,Left arm,Chest,Abdomen,Front perineal area,Buttocks,Right upper leg,Left upper leg,Left lower leg,Face   Body parts bathed by helper: Right lower leg     Bathing assist Assist Level: Contact Guard/Touching assist     Upper Body Dressing/Undressing Upper body dressing   What is the patient wearing?: Pull over shirt    Upper body assist Assist Level: Supervision/Verbal cueing    Lower Body Dressing/Undressing Lower body dressing      What is the patient wearing?: Underwear/pull up,Pants     Lower body assist Assist for lower body dressing: Minimal Assistance - Patient > 75%     Toileting Toileting    Toileting assist Assist for toileting: Supervision/Verbal cueing     Transfers Chair/bed transfer  Transfers assist     Chair/bed transfer assist level: Minimal Assistance - Patient > 75%     Locomotion Ambulation   Ambulation assist      Assist level: Moderate Assistance - Patient 50 - 74% Assistive device: Other (comment) (handrail) Max distance: 28ft   Walk 10 feet activity   Assist     Assist level: Moderate Assistance - Patient - 50 - 74% Assistive  device: Other (comment) (handrail)   Walk 50 feet activity   Assist    Assist level: Moderate Assistance - Patient - 50 - 74% Assistive device: Other (comment) (handrail)    Walk 150 feet activity   Assist Walk 150 feet activity did not occur: Safety/medical concerns (fatigue, R hemi, decreased balance, weakness)         Walk 10 feet on uneven surface  activity   Assist     Assist level: Moderate Assistance - Patient - 50 - 74% Assistive device: Other (comment) (handrail)   Wheelchair     Assist Will patient use wheelchair at discharge?: No Type of Wheelchair: Manual    Wheelchair assist level: Supervision/Verbal cueing Max wheelchair distance: 168ft    Wheelchair 50 feet with 2 turns activity    Assist         Assist Level: Supervision/Verbal cueing   Wheelchair 150 feet activity     Assist      Assist Level: Supervision/Verbal cueing   Blood pressure (!) 165/85, pulse (!) 54, temperature 97.7 F (36.5 C), temperature source Oral, resp. rate 17, SpO2 96 %.  Medical Problem List and Plan: 1.  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  Continue CIR 2.  Impaired mobility: Continue Lovenox             -antiplatelet therapy: Aspirin 325 mg daily and Plavix 75 mg daily x3 months then aspirin alone 3. Right shoulder pain: Continue lidoderm patch   Controlled on 5/20 4. Mood: Provide emotional support             -antipsychotic agents: N/A 5. Neuropsych: This patient is capable of making decisions on her own behalf. 6. Skin/Wound Care: Routine skin checks 7. Fluids/Electrolytes/Nutrition: Routine in and outs.  8.  Hypertension.  Norvasc 10 mg daily, increased lisinopril to 5 mg daily on 5/19.    Elevated today, will not make medication adjustments given recent increase             Monitor with increased mobility 9.  Hyperlipidemia: Lipitor 10.  History of diastolic congestive heart failure.               Monitor for signs and symptoms of fluid overload 11.  Tobacco abuse.  NicoDerm patch.  Provide counseling 12.  Thyroid nodule.  Incidental finding.  Outpatient percutaneous sampling. 13. Nausea  Zofran prn started on 5/20 14.  Hyponatremia  Sodium 132 on 5/19, labs ordered for Monday 15.  Hypoalbuminemia  Supplement initiated on 5/20    LOS: 2 days A FACE TO FACE EVALUATION WAS PERFORMED  Madison Direnzo Karis Juba 03/07/2021, 9:25 AM

## 2021-03-07 NOTE — IPOC Note (Signed)
Individualized overall Plan of Care (IPOC) Patient Details Name: Charles Callahan MRN: [id number] DOB: 1970/10/28  Admitting Diagnosis: Subcortical infarction [facility])  Hospital Problems: Principal Problem:   Subcortical infarction (HCC) Active Problems:   Hypoalbuminemia due to protein-calorie malnutrition (HCC)   Hyponatremia   Essential hypertension     Functional Problem List: Nursing Behavior,Bladder,Bowel,Endurance,Medication Management,Nutrition,Pain,Safety,Skin Integrity  PT Balance,Endurance,Motor,Pain,Sensory,Safety  OT Balance,Sensory,Endurance,Motor,Pain  SLP Cognition  TR         Basic ADL's: OT Dressing,Bathing,Toileting     Advanced  ADL's: OT [employer]     Transfers: PT Bed Mobility,Bed to [employer]  OT Toilet,Tub/Shower     Locomotion: PT Ambulation,Wheelchair Mobility,Stairs     Additional Impairments: OT Fuctional Use of Upper Extremity  SLP        TR      Anticipated Outcomes Item Anticipated Outcome  Self Feeding    Swallowing      Basic self-care  mod I  Toileting  mod I   Bathroom Transfers mod I  Bowel/Bladder  min assist  Transfers  Mod I  Locomotion  Mod I  Communication  Mod I  Cognition  Supervision - Mod I  Pain  <3  Safety/Judgment  min assist and no falls   Therapy Plan: PT Intensity: Minimum of 1-2 x/day ,45 to 90 minutes PT Frequency: 5 out of 7 days PT Duration Estimated Length of Stay: 12-14 days OT Intensity: Minimum of 1-2 x/day, 45 to 90 minutes OT Frequency: 5 out of 7 days OT Duration/Estimated Length of Stay: 14 days SLP Intensity: Minumum of 1-2 x/day, 30 to 90 minutes SLP Frequency: 3 to 5 out of 7 days SLP Duration/Estimated Length of Stay: 9-14 days    Team Interventions: Nursing Interventions Patient/Family Education,Bladder Management,Bowel Management,Disease Management/Prevention,Pain Management,Medication Management,Skin Care/Wound Engineer, technical sales  Support  PT interventions Ambulation/gait training,Discharge planning,Functional mobility training,Psychosocial support,Therapeutic Activities,Balance/vestibular training,Disease management/prevention,Neuromuscular re-education,Therapeutic Exercise,Wheelchair propulsion/positioning,Cognitive remediation/compensation,DME/adaptive equipment instruction,Pain management,Splinting/orthotics,UE/LE Strength taining/ROM,Community reintegration,Functional electrical stimulation,Patient/family education,Stair training,UE/LE Coordination activities  OT Interventions Balance/vestibular training,Community reintegration,DME/adaptive equipment instruction,Discharge planning,Neuromuscular re-education,Functional mobility training,Patient/family education,Psychosocial support,Therapeutic Exercise,Therapeutic Activities,Self Care/advanced ADL retraining,UE/LE Coordination activities,UE/LE Strength taining/ROM  SLP Interventions Cognitive remediation/compensation,Therapeutic Activities,Therapeutic Exercise,Functional tasks,Cueing hierarchy,Internal/external aids,Medication managment,Patient/family education  TR Interventions    SW/CM Interventions Discharge Planning,Psychosocial Support,Patient/Family Education   Barriers to Discharge MD  Medical stability  Nursing Decreased caregiver support,Home environment access/layout,Incontinence,Wound Care,Lack of/limited family support,Insurance for SNF coverage,Weight,Medication compliance,Behavior,Nutrition means    PT Decreased caregiver support,Lack of/limited family support primary caregiver for Charles Callahan mother  OT Decreased caregiver support,Home environment access/layout lives with mother - intermittent spvsn/support  SLP Other (comments) none for speech  SW Decreased caregiver support,Medication compliance,Insurance for SNF coverage Will need to be mod/i has no caregiver at home, uninsured and has no coverage for alternative DC plan   Team Discharge  Planning: Destination: PT-Home ,OT- Home , SLP-Home Projected Follow-up: PT-Outpatient PT, OT-  Outpatient OT,Other (comment) (intermittent support PRN), SLP-None Projected Equipment Needs: PT-To be determined, OT- To be determined, SLP-None recommended by SLP Equipment Details: PT-has none, OT-  Patient/family involved in discharge planning: PT- Patient,  OT-Patient, SLP-Patient  MD ELOS: 8-13 days. Medical Rehab Prognosis:  Good Assessment: 50 year old right-handed male history of diastolic congestive heart failure as well as hypertension and tobacco use.  Independent prior to admission.  1 level home with level entry.  History taken from chart review and patient.  He presented on 02/27/2021 with acute onset of right side hemiparesis.  Cranial CT scan unremarkable for acute intracranial process.  Area of  low density in the cerebral hemisphere white matter consistent with small vessel ischemic change.  Patient did not receive tPA.  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  No associated hemorrhage or mass-effect.  CT angiogram of the head and neck showed no hemodynamically significant stenosis of the neck however there was a 3-4 cm mass abutting or arising from the left thyroid lobe.  Ultrasound of thyroid showed a solitary 3.7 cm right thyroid nodule recommending percutaneous sampling which could be done as outpatient.    Echocardiogram with ejection fraction 55 to 60%, no wall motion abnormalities grade 2 diastolic dysfunction.  Admission chemistries unremarkable except sodium 133, urine drug screen negative alcohol negative urinalysis negative nitrite.  Currently maintained on aspirin 325 mg daily and Plavix 75 mg daily for CVA prophylaxis x3 months then aspirin alone.  Tolerating a regular diet.    Patient with resulting functional deficits in mobility, transfers, self-care, cognition.  We will set goals for mod I with PT/OT/SLP   Due  to the current state of emergency, patients may not be receiving their 3-hours of Medicare-mandated therapy.  See Team Conference Notes for weekly updates to the plan of care

## 2021-03-08 DIAGNOSIS — R110 Nausea: Secondary | ICD-10-CM

## 2021-03-08 NOTE — Progress Notes (Addendum)
Physical Therapy Session Note  Patient Details  Name: Charles Callahan MRN: [id number] Date of Birth: 06/08/1971  Today's Date: 03/08/2021 PT Individual Time: 0900-1000 ; 4098-1191 PT Individual Time Calculation (min): 60 min , 30 min  Short Term Goals: Week 1:  PT Short Term Goal 1 (Week 1): pt will perform bed mobility with supervision PT Short Term Goal 2 (Week 1): pt will perform bed<>chair transfers with LRAD and CGA PT Short Term Goal 3 (Week 1): pt will ambulate 42ft with LRAD and CGA  Skilled Therapeutic Interventions/Progress Updates:     tx 1:  Pt seated in wc.  He denied pain.    neuromuscular re-education via forced use, mirror feedback, and multimodal cues for : -R knee control, in sitting and standing on Kinetron, at resistance 50 cm/sec.  Reciprocal alternating movements of bil LEs in stanidng with bil UE support> 1UE support, with empasis on wt shifting and R knee extension without hyperextension.   - IIn sitting with feet unsupported, reciprocal scooting forward/backward for pelvic dissociation.   -Seated in wc with pillow case under R foot, R knee extension/flexion with visual target for hamstring activation. Propelling wc using bil LES x 30' with supervision.  - pre-gait activity in biased to R standing during R hand activity x 5 minutes  Gait training with RW x 80' with CGa, cues for upright trunk, forward gaze and pacing speed to ensure r knee control.  At end of session, pt seated in wc, moving around room to obtain items for shower during next session.  Steward Drone, NT confirmed that pt did not require seat belt alarm.  tx 2:  Pt seated in w/c.  He denied pain.  Sit> stand with close supervision,using Bobath method with laced fingers in front of him, shoulder flexion.  Further sit>< stands from low/average surfaces during session without use of UEs, CGA.  neuromuscular re-education for R LE stance stability via demo, multimodal cues : Standing with bil UE support  for mini squats, toe/heel raises , hamstring curls.  Standing with forefeet on blue wedge, with bil UE support> 0 UE support x 5 minutes.  Pt demonstrated balance strategies at hips and ankles with cueing.  Seated - alternating ankle DF.  At end of session, pt seated in wc with needs at hand.   Therapy Documentation Precautions:  Precautions Precautions: Fall Precaution Comments: R hemi Restrictions Weight Bearing Restrictions: No     Therapy/Group: Individual Therapy  Kyllie Pettijohn 03/08/2021, 10:17 AM

## 2021-03-08 NOTE — Progress Notes (Signed)
PROGRESS NOTE   Subjective/Complaints: Patient seen laying in bed this morning.  He states he slept well overnight.  He denies complaints.  ROS: Denies CP, SOB, N/V/D  Objective:   No results found. Recent Labs    03/06/21 0549  WBC 4.8  HGB 14.6  HCT 43.7  PLT 236   Recent Labs    03/06/21 0549  NA 132*  K 4.3  CL 102  CO2 26  GLUCOSE 97  BUN 16  CREATININE 0.99  CALCIUM 10.9*    Intake/Output Summary (Last 24 hours) at 03/08/2021 1123 Last data filed at 03/08/2021 0850 Gross per 24 hour  Intake 542 ml  Output 2500 ml  Net -1958 ml        Physical Exam: Vital Signs Blood pressure (!) 139/91, pulse (!) 49, temperature (!) 97.5 F (36.4 C), resp. rate 17, SpO2 99 %. Constitutional: No distress . Vital signs reviewed. HENT: Normocephalic.  Atraumatic. Eyes: EOMI. No discharge. Cardiovascular: No JVD.  RRR. Respiratory: Normal effort.  No stridor.  Bilateral clear to auscultation. GI: Non-distended.  BS +. Skin: Warm and dry.  Intact. Psych: Normal mood.  Normal behavior. Musc: No edema in extremities.  No tenderness in extremities. Neuro: Alert Makes eye contact with examiner.   Follows commands.   Fair insight and awareness. Motor: LUE/LE: 5/5 proximal distal RUE: Shoulder abduction 3-/5 distally 2-/5, stable RLE: 3-/5 HF, KE, 1+/5 ADF, stable  Assessment/Plan: 1. Functional deficits which require 3+ hours per day of interdisciplinary therapy in a comprehensive inpatient rehab setting.  Physiatrist is providing close team supervision and 24 hour management of active medical problems listed below.  Physiatrist and rehab team continue to assess barriers to discharge/monitor patient progress toward functional and medical goals  Care Tool:  Bathing    Body parts bathed by patient: Right arm,Left arm,Chest,Abdomen,Front perineal area,Buttocks,Right upper leg,Left upper leg,Left lower leg,Face    Body parts bathed by helper: Right lower leg     Bathing assist Assist Level: Contact Guard/Touching assist     Upper Body Dressing/Undressing Upper body dressing   What is the patient wearing?: Pull over shirt    Upper body assist Assist Level: Supervision/Verbal cueing    Lower Body Dressing/Undressing Lower body dressing      What is the patient wearing?: Underwear/pull up     Lower body assist Assist for lower body dressing: Minimal Assistance - Patient > 75%     Toileting Toileting    Toileting assist Assist for toileting: Supervision/Verbal cueing     Transfers Chair/bed transfer  Transfers assist     Chair/bed transfer assist level: Supervision/Verbal cueing     Locomotion Ambulation   Ambulation assist      Assist level: Contact Guard/Touching assist Assistive device: Walker-rolling (r hand splint) Max distance: 80   Walk 10 feet activity   Assist     Assist level: Contact Guard/Touching assist Assistive device: Walker-rolling   Walk 50 feet activity   Assist    Assist level: Contact Guard/Touching assist Assistive device: Walker-rolling    Walk 150 feet activity   Assist Walk 150 feet activity did not occur: Safety/medical concerns (fatigue, R hemi, decreased balance,  weakness)         Walk 10 feet on uneven surface  activity   Assist     Assist level: Moderate Assistance - Patient - 50 - 74% Assistive device: Other (comment) (handrail)   Wheelchair     Assist Will patient use wheelchair at discharge?: No Type of Wheelchair: Manual    Wheelchair assist level: Supervision/Verbal cueing Max wheelchair distance: 153ft    Wheelchair 50 feet with 2 turns activity    Assist        Assist Level: Supervision/Verbal cueing   Wheelchair 150 feet activity     Assist      Assist Level: Supervision/Verbal cueing   Blood pressure (!) 139/91, pulse (!) 49, temperature (!) 97.5 F (36.4 C), resp. rate  17, SpO2 99 %.  Medical Problem List and Plan: 1.  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  Continue CIR 2.  Impaired mobility: Continue Lovenox             -antiplatelet therapy: Aspirin 325 mg daily and Plavix 75 mg daily x3 months then aspirin alone 3. Right shoulder pain: Continue lidoderm patch   Controlled on 5/21 4. Mood: Provide emotional support             -antipsychotic agents: N/A 5. Neuropsych: This patient is capable of making decisions on her own behalf. 6. Skin/Wound Care: Routine skin checks 7. Fluids/Electrolytes/Nutrition: Routine in and outs.  8.  Hypertension.  Norvasc 10 mg daily, increased lisinopril to 5 mg daily on 5/19.    Mildly elevated, but appears to be improving on 5/21             Monitor with increased mobility 9.  Hyperlipidemia: Lipitor 10.  History of diastolic congestive heart failure.               Monitor for signs and symptoms of fluid overload 11.  Tobacco abuse.  NicoDerm patch.  Provide counseling 12.  Thyroid nodule.  Incidental finding.  Outpatient percutaneous sampling. 13. Nausea  Zofran prn started on 5/20  Improved 14.  Hyponatremia  Sodium 132 on 5/19, labs ordered for Monday 15.  Hypoalbuminemia  Supplement initiated on 5/20    LOS: 3 days A FACE TO FACE EVALUATION WAS PERFORMED  Charles Callahan Karis Juba 03/08/2021, 11:23 AM

## 2021-03-08 NOTE — Progress Notes (Signed)
Occupational Therapy Session Note  Patient Details  Name: Charles Callahan MRN: [id number] Date of Birth: 1971/01/27  Today's Date: 03/08/2021 OT Individual Time: 1107-1200 OT Individual Time Calculation (min): 53 min    Short Term Goals: Week 1:  OT Short Term Goal 1 (Week 1): Patient will complete LB dressing/bathing using AE PRN with close spvsn. OT Short Term Goal 2 (Week 1): Patient will use RUE in BADL tasks at fine motor assist level with min vc's. OT Short Term Goal 3 (Week 1): Patient will complete 3/3 toileting tasks with close spvsn. OT Short Term Goal 4 (Week 1): Patient will complete dynamic standing ADLs with close spvsn.  Skilled Therapeutic Interventions/Progress Updates:    Pt received seated in w/c in hallway, agreeable to therapy. Session focus on functional mobility, RUE/RLE NMR in prep for improved ADL/IADL performance. C/o baseline R shoulder pain, not requesting rx at this time. Self-propelled w/c throughout session with hemi-technique mod I. Req to go outside for change of scenery/improved psychosocial health. Completed 2x15 of the following with 2 lb dowel rod: B shoulder flexion and hold, chest press, and volleyball. Good symmetry noted when holding bar. Additionally, assessed B grip strength with the following results:  R: 10, 15, 26; average of 17 lbs L: 90, 64, 62; average of 72 lbs  Pt self-propelled self from outdoor area to 1st floor elevators utilizing BLE/BUE with increased time, more difficulty noted incorporating RUE than RLE. Provided Stroke Support Group brochure to pt; expressed he is very interested.   Pt left in doorway of his room with NT present/aware, all immediate needs met.    Therapy Documentation Precautions:  Precautions Precautions: Fall Precaution Comments: R hemi Restrictions Weight Bearing Restrictions: No Pain: see session note   ADL: See Care Tool for more details.   Therapy/Group: Individual Therapy  Volanda Napoleon MS,  OTR/L  03/08/2021, 7:00 AM

## 2021-03-08 NOTE — Progress Notes (Signed)
Speech Language Pathology Daily Session Note  Patient Details  Name: Charles Callahan MRN: [id number] Date of Birth: 09-26-1971  Today's Date: 03/08/2021 SLP Individual Time: 1300-1325 SLP Individual Time Calculation (min): 25 min  Short Term Goals: Week 1: SLP Short Term Goal 1 (Week 1): Pt will utilize over articulation and increased vocal intensity strategies with Supervision A SLP Short Term Goal 2 (Week 1): Pt will understand, recall and utilize standard swallow precautions with Supervision A SLP Short Term Goal 3 (Week 1): Pt will recall functional/complex information with 80% accuracy min A SLP Short Term Goal 4 (Week 1): Pt will complete medication management task with min A  Skilled Therapeutic Interventions: Skilled treatment session focused on cognitive goals. Patient completed a high-level organization and problem solving task with Mod I in regards to organizing a calendar appropriately of potential appointments. Patient demonstrated appropriate recall of functional information and anticipatory awareness in regards to changes that need to be made to reduce chance of having another stroke. Patient was 100% intelligible with Mod I at conversation level. Patient left upright in wheelchair with alarm on and all needs within reach. Continue with current plan of care.      Pain No/Denies Pain   Therapy/Group: Individual Therapy  Taishawn Smaldone 03/08/2021, 1:37 PM

## 2021-03-10 LAB — BASIC METABOLIC PANEL
Anion gap: 4 — ABNORMAL LOW (ref 5–15)
BUN: 16 mg/dL (ref 6–20)
CO2: 26 mmol/L (ref 22–32)
Calcium: 12 mg/dL — ABNORMAL HIGH (ref 8.9–10.3)
Chloride: 102 mmol/L (ref 98–111)
Creatinine, Ser: 0.88 mg/dL (ref 0.61–1.24)
GFR, Estimated: >60 mL/min (ref >60)
Glucose, Bld: 103 mg/dL — ABNORMAL HIGH (ref 70–99)
Potassium: 4.3 mmol/L (ref 3.5–5.1)
Sodium: 132 mmol/L — ABNORMAL LOW (ref 135–145)

## 2021-03-10 NOTE — Progress Notes (Signed)
Occupational Therapy Session Note  Patient Details  Name: Charles Callahan MRN: [id number] Date of Birth: 24-Apr-1971  Today's Date: 03/10/2021 OT Individual Time: 4888-9169 OT Individual Time Calculation (min): 39 min    Short Term Goals: Week 1:  OT Short Term Goal 1 (Week 1): Patient will complete LB dressing/bathing using AE PRN with close spvsn. OT Short Term Goal 2 (Week 1): Patient will use RUE in BADL tasks at fine motor assist level with min vc's. OT Short Term Goal 3 (Week 1): Patient will complete 3/3 toileting tasks with close spvsn. OT Short Term Goal 4 (Week 1): Patient will complete dynamic standing ADLs with close spvsn.   Skilled Therapeutic Interventions/Progress Updates:    Pt greeted at time of session rolling himself in hallway for exercise, NT and RN close by. Hand off to OT for session. Pt performing wheelchair propulsion with Mod I/Supervision today from room > elevator > 4th floor gym. Pt walking short distances approx 20 feet or less throughout gym with CGA and RW with hand attachment. Dynamic standing at rebounder first with lightweight green ball progressing to 2kg green ball for the following in standing with no UE support: toss, toss +overhead, toss + torso twist 1x15-20 reps each. Min facilitation at RUE for full ROM but pt motivated to participate. Seated ball tosses overhead with focus on symmetry for BUEs and bimanual tasks, 2x20 each. Focus of session on NMR/bimanual tasks for RUE to improve functional use. Self propel back to room, call bell in reach all needs met.   Therapy Documentation Precautions:  Precautions Precautions: Fall Precaution Comments: R hemi Restrictions Weight Bearing Restrictions: No     Therapy/Group: Individual Therapy  Viona Gilmore 03/10/2021, 12:45 PM

## 2021-03-10 NOTE — Progress Notes (Signed)
PROGRESS NOTE   Subjective/Complaints: No complaints this morning. Denies pain, constipation, insomnia.  Patient's chart reviewed- No issues reported overnight Vitals signs stable  ROS: Denies CP, SOB, N/V/D, +pain right shoulder  Objective:   No results found. No results for input(s): WBC, HGB, HCT, PLT in the last 72 hours. Recent Labs    03/10/21 1105  NA 132*  K 4.3  CL 102  CO2 26  GLUCOSE 103*  BUN 16  CREATININE 0.88  CALCIUM 12.0*    Intake/Output Summary (Last 24 hours) at 03/10/2021 1215 Last data filed at 03/10/2021 1208 Gross per 24 hour  Intake 414 ml  Output 1700 ml  Net -1286 ml        Physical Exam: Vital Signs Blood pressure (!) 144/87, pulse (!) 52, temperature 97.8 F (36.6 C), resp. rate 18, SpO2 97 %. Gen: no distress, normal appearing HEENT: oral mucosa pink and moist, NCAT Cardio: Reg rate Chest: normal effort, normal rate of breathing Abd: soft, non-distended Ext: no edema Psych: Normal mood.  Normal behavior. Musc: No edema in extremities.  No tenderness in extremities. Neuro: Alert Makes eye contact with examiner.   Follows commands.   Fair insight and awareness. Motor: LUE/LE: 5/5 proximal distal RUE: Shoulder abduction 3-/5 distally 2-/5, stable RLE: 3-/5 HF, KE, 1+/5 ADF, stable  Assessment/Plan: 1. Functional deficits which require 3+ hours per day of interdisciplinary therapy in a comprehensive inpatient rehab setting.  Physiatrist is providing close team supervision and 24 hour management of active medical problems listed below.  Physiatrist and rehab team continue to assess barriers to discharge/monitor patient progress toward functional and medical goals  Care Tool:  Bathing    Body parts bathed by patient: Right arm,Left arm,Chest,Abdomen,Front perineal area,Buttocks,Right upper leg,Left upper leg,Left lower leg,Face   Body parts bathed by helper: Right lower  leg     Bathing assist Assist Level: Contact Guard/Touching assist     Upper Body Dressing/Undressing Upper body dressing   What is the patient wearing?: Pull over shirt    Upper body assist Assist Level: Supervision/Verbal cueing    Lower Body Dressing/Undressing Lower body dressing      What is the patient wearing?: Underwear/pull up     Lower body assist Assist for lower body dressing: Minimal Assistance - Patient > 75%     Toileting Toileting    Toileting assist Assist for toileting: Supervision/Verbal cueing     Transfers Chair/bed transfer  Transfers assist     Chair/bed transfer assist level: Contact Guard/Touching assist (quad cane)     Locomotion Ambulation   Ambulation assist      Assist level: Minimal Assistance - Patient > 75% Assistive device: Cane-quad Max distance: 165ft   Walk 10 feet activity   Assist     Assist level: Minimal Assistance - Patient > 75% Assistive device: Cane-quad   Walk 50 feet activity   Assist    Assist level: Minimal Assistance - Patient > 75% Assistive device: Cane-quad    Walk 150 feet activity   Assist Walk 150 feet activity did not occur: Safety/medical concerns (fatigue, R hemi, decreased balance, weakness)  Assist level: Minimal Assistance - Patient > 75%  Assistive device: Cane-quad    Walk 10 feet on uneven surface  activity   Assist     Assist level: Moderate Assistance - Patient - 50 - 74% Assistive device: Other (comment) (handrail)   Wheelchair     Assist Will patient use wheelchair at discharge?: Yes Type of Wheelchair: Manual    Wheelchair assist level: Independent Max wheelchair distance: >360ft    Wheelchair 50 feet with 2 turns activity    Assist        Assist Level: Independent   Wheelchair 150 feet activity     Assist      Assist Level: Independent   Blood pressure (!) 144/87, pulse (!) 52, temperature 97.8 F (36.6 C), resp. rate 18, SpO2  97 %.  Medical Problem List and Plan: 1.  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  Continue CIR 2.  Impaired mobility: Continue Lovenox             -antiplatelet therapy: Aspirin 325 mg daily and Plavix 75 mg daily x3 months then aspirin alone 3. Right shoulder pain: Continue lidoderm patch   Controlled on 5/23 4. Mood: Provide emotional support             -antipsychotic agents: N/A 5. Neuropsych: This patient is capable of making decisions on her own behalf. 6. Skin/Wound Care: Routine skin checks 7. Fluids/Electrolytes/Nutrition: Routine in and outs.  8.  Hypertension.  Continue Norvasc 10 mg daily, increased lisinopril to 5 mg daily on 5/19.    Mildly elevated, but appears to be improving on 5/23             Monitor with increased mobility 9.  Hyperlipidemia: Lipitor 10.  History of diastolic congestive heart failure.               Monitor for signs and symptoms of fluid overload 11.  Tobacco abuse.  NicoDerm patch.  Provide counseling 12.  Thyroid nodule.  Incidental finding.  Outpatient percutaneous sampling. 13. Nausea  Zofran prn started on 5/20  Improved 14.  Hyponatremia  Sodium 132 on 5/19, 133 5/23 15.  Hypoalbuminemia  Supplement initiated on 5/20    LOS: 5 days A FACE TO FACE EVALUATION WAS PERFORMED  Charles Callahan 03/10/2021, 12:15 PM

## 2021-03-10 NOTE — Progress Notes (Signed)
Physical Therapy Session Note  Patient Details  Name: Charles Callahan MRN: [id number] Date of Birth: February 14, 1971  Today's Date: 03/10/2021 PT Individual Time: 7902-4097 and 3532-9924  PT Individual Time Calculation (min): 39 min and 25 min  Short Term Goals: Week 1:  PT Short Term Goal 1 (Week 1): pt will perform bed mobility with supervision PT Short Term Goal 2 (Week 1): pt will perform bed<>chair transfers with LRAD and CGA PT Short Term Goal 3 (Week 1): pt will ambulate 4ft with LRAD and CGA  Skilled Therapeutic Interventions/Progress Updates:   Treatment Session 1 Received pt sitting in WC, pt agreeable to therapy, and denied any pain during session. Session with emphasis on functional mobility/transfers, generalized strengthening, dynamic standing balance/coordination, gait training, and improved activity tolerance. Pt performed WC mobility >127ft x 2 trials using LLE and LUE mod I to 4W dayroom with 2 rest breaks. Pt requested to practice ambulating with cane. Provided pt with large base quad cane and educated pt on technique. Pt ambulated 171ft with CGA/min A. Pt demonstrated decreased R foot clearance, narrow BOS, decreased cadence, and mild difficulty sequencing steps. Pt then ambulated additional 11ft with RW and CGA/supervision with increased cadence, increased foot clearance, and improved stability. Discussed using quad cane vs RW upon D/C and educated pt on RW allowing pt to be more independent; pt in agreement. Worked on blocked practice sit<>stands without UE support and supervision 1x7 and 1x10 with emphasis on quad control; no hyperextension noted. Pt performed WC mobility >31ft using LUE/LLE mod I back to room and performed the following exercises sitting in [facility] with supervision and verbal cues for technique: -hip flexion 2x12 on RLE -LAQ 2x12 on RLE -WC pushups x8 with therapist stabilizing R shoulder  Concluded session with pt sitting in WC with all needs within reach    Treatment Session 2 Received pt sitting in WC, pt agreeable to therapy, and denied any pain during session. Session with emphasis on functional mobility/transfers, generalized strengthening, dynamic standing balance/coordination, stair navigation, and improved activity tolerance. Pt performed WC mobility 172ft x 1 and 198ft x 1 using LLE/LUE mod I to 4W therapy gym. Pt navigated 12 steps with 2 rails and CGA to simulate daughter's home entry. Pt ascended and descended using a step to pattern. Pt transferred on/off Nustep without AD and CGA. Pt received phone call from disability services therefore limiting time spent on Nustep. Pt performed BLE/UE strengthening on Nustep at workload 4 increasing to workload 6 for 5 minutes for a total of 200 steps for improved cardiovascular endurance and reciprocal movement training with emphasis on functional use of RUE. Pt transported back to room in [facility] total A. Concluded session with pt sitting in [facility] with all needs within reach.   Therapy Documentation Precautions:  Precautions Precautions: Fall Precaution Comments: R hemi Restrictions Weight Bearing Restrictions: No  Therapy/Group: Individual Therapy Martin Majestic PT, DPT   03/10/2021, 7:29 AM

## 2021-03-10 NOTE — Progress Notes (Signed)
Speech Language Pathology Daily Session Note  Patient Details  Name: Charles Callahan MRN: [id number] Date of Birth: 1970/12/25  Today's Date: 03/10/2021 SLP Individual Time: 1110-1140 SLP Individual Time Calculation (min): 30 min  Short Term Goals: Week 1: SLP Short Term Goal 1 (Week 1): Pt will utilize over articulation and increased vocal intensity strategies with Supervision A SLP Short Term Goal 2 (Week 1): Pt will understand, recall and utilize standard swallow precautions with Supervision A SLP Short Term Goal 3 (Week 1): Pt will recall functional/complex information with 80% accuracy min A SLP Short Term Goal 4 (Week 1): Pt will complete medication management task with min A  Skilled Therapeutic Interventions: Skilled SLP intervention focused on cognition and dysphagia. Pt able to recall standard swallow precautions with supervision A. He reported he only notices coughing when he takes larger bites and has a lot to chew. Pt seen with regular snack and demonstrated use of swallow precautions with no cueing required. He tolerated regular snack with no s/sx of aspiration or penetration. Pt completed pill sorting task with BID pil organizer. All meds are 1x/day and taken in the morning. he was able to sort medications with Supervision A. Cont with therapy per plan of care.      Pain Pain Assessment Pain Scale: Faces Pain Score: 0-No pain Faces Pain Scale: No hurt  Therapy/Group: Individual Therapy  Charles Callahan 03/10/2021, 11:29 AM

## 2021-03-10 NOTE — Progress Notes (Signed)
Occupational Therapy Session Note  Patient Details  Name: Charles Callahan MRN: [id number] Date of Birth: 11/16/1970  Today's Date: 03/10/2021 OT Individual Time: 6440-3474 OT Individual Time Calculation (min): 60 min    Short Term Goals: Week 1:  OT Short Term Goal 1 (Week 1): Patient will complete LB dressing/bathing using AE PRN with close spvsn. OT Short Term Goal 2 (Week 1): Patient will use RUE in BADL tasks at fine motor assist level with min vc's. OT Short Term Goal 3 (Week 1): Patient will complete 3/3 toileting tasks with close spvsn. OT Short Term Goal 4 (Week 1): Patient will complete dynamic standing ADLs with close spvsn.  Skilled Therapeutic Interventions/Progress Updates:    Treatment session with focus on RUE shoulder ROM and functional use of RUE.  Pt received upright in w/c reporting already bathed and dressed prior to session.  Pt propelled w/c to therapy gym using hemi-technique mod I.  Pt completed stand pivot transfer CGA to therapy mat.  Pt transitioned to supine on mat with supervision.  Engaged in shoulder ROM in supine and sidelying with focus on increased ROM and decreased pain.  Therapist providing support at shoulder and elbow to further facilitate increased range while providing min cues for improved technique.  Pt reports mild pain, reports muscle pain, when flexion >90*, decreasing with mobility.  Utilized dowel in BUE in supine for improved symmetrical movements while focusing on small deviations of activity to increase strength and motor control.  In sidelying utilized large ball with focus on shoulder scaption and flexion/extension for improved ROM and decreased pain.  Transitioned to sitting edge of mat while continuing to focus on shoulder flexion > 90*.  Utilized resistive clothespins for grip and pinch strength while incorporating functional reach.  Pt reports mild pain in shoulder with reaching with clothespins, however no c/o pain in R shoulder when reaching >90*  when completing dynamic standing and reaching with card activity.  Therapist providing min cues to decrease compensatory movements while facilitating increased ROM.  Therapy Documentation Precautions:  Precautions Precautions: Fall Precaution Comments: R hemi Restrictions Weight Bearing Restrictions: No Pain: Pain Assessment Pain Scale: 0-10 Pain Score: 0-No pain   Therapy/Group: Individual Therapy  Rosalio Loud 03/10/2021, 11:10 AM

## 2021-03-11 MED ORDER — LISINOPRIL 10 MG PO TABS
10.0000 mg | ORAL_TABLET | Freq: Every day | ORAL | Status: DC
  Administered 2021-03-11 – 2021-03-12 (×2): 10 mg via ORAL
  Filled 2021-03-11 (×2): qty 1

## 2021-03-11 NOTE — Progress Notes (Signed)
Pt frustrated this am as staff put bed alarm on his bed. Pt states " I am independent and therapy doesn't put my alarm on". Instructed pt that it is hospital policy to use bed alarm unless pt has been given a paper on his door stating he is independent.

## 2021-03-11 NOTE — Progress Notes (Addendum)
Patient ID: Charles Callahan, male   DOB: 1971/06/19, 50 y.o.   MRN: [id number] Team Conference Report to Patient/Family  Team Conference discussion was reviewed with the patient and caregiver, including goals, any changes in plan of care and target discharge date.  Patient and caregiver express understanding and are in agreement.  The patient has a target discharge date of 03/14/21.   Called pt spouse (Tammy). Introduced self. Provided conferences updates. Pt spouse reports her or daughter are unavailable for 24/7 supervision. Spouse will discuss with pt mother about her providing supervision. Will also bring in clothing for pt. SW will cont to follow up.  Andria Rhein 03/11/2021, 2:06 PM

## 2021-03-11 NOTE — Plan of Care (Signed)
  Problem: Consults Goal: RH STROKE PATIENT EDUCATION Description: See Patient Education module for education specifics  Outcome: Progressing   Problem: RH BOWEL ELIMINATION Goal: RH STG MANAGE BOWEL WITH ASSISTANCE Description: STG Manage Bowel with Mod I Assistance. Outcome: Progressing Goal: RH STG MANAGE BOWEL W/MEDICATION W/ASSISTANCE Description: STG Manage Bowel with Medication with Mod I Assistance. Outcome: Progressing   Problem: RH SKIN INTEGRITY Goal: RH STG ABLE TO PERFORM INCISION/WOUND CARE W/ASSISTANCE Description: STG Able To Perform Incision/Wound Care With Mod I Assistance. Outcome: Progressing   Problem: RH SAFETY Goal: RH STG ADHERE TO SAFETY PRECAUTIONS W/ASSISTANCE/DEVICE Description: STG Adhere to Safety Precautions With Mod I Assistance/Device. Outcome: Progressing Goal: RH STG DECREASED RISK OF FALL WITH ASSISTANCE Description: STG Decreased Risk of Fall With cues and reminders. Outcome: Progressing   Problem: RH PAIN MANAGEMENT Goal: RH STG PAIN MANAGED AT OR BELOW PT'S PAIN GOAL Description: < 3 on a 0-10 pain scale. Outcome: Progressing   Problem: RH KNOWLEDGE DEFICIT Goal: RH STG INCREASE KNOWLEDGE OF HYPERTENSION Description: Patient will demonstrate knowledge of medication management, HTN medications, dietary restrictions, BP parameters with educational materials and handouts provided by staff, at discharge independently. Outcome: Progressing Goal: RH STG INCREASE KNOWLEGDE OF HYPERLIPIDEMIA Description: Patient will demonstrate knowledge of HLD medications, lab values, dietary plans with educational materials and handouts provided by staff, at discharge independently. Outcome: Progressing Goal: RH STG INCREASE KNOWLEDGE OF STROKE PROPHYLAXIS Description: Patient will demonstrate knowledge of medications used to prevent future strokes with educational materials and handouts provided by staff, independently at discharge. Outcome: Progressing

## 2021-03-11 NOTE — Progress Notes (Signed)
PROGRESS NOTE   Subjective/Complaints: Patient was frustrated by bed alarm yesterday Patient's chart reviewed- No issues reported overnight Vitals signs stable  ROS: Denies CP, SOB, N/V/D, +pain right shoulder  Objective:   No results found. No results for input(s): WBC, HGB, HCT, PLT in the last 72 hours. Recent Labs    03/10/21 1105  NA 132*  K 4.3  CL 102  CO2 26  GLUCOSE 103*  BUN 16  CREATININE 0.88  CALCIUM 12.0*    Intake/Output Summary (Last 24 hours) at 03/11/2021 0639 Last data filed at 03/11/2021 0438 Gross per 24 hour  Intake 237 ml  Output 775 ml  Net -538 ml        Physical Exam: Vital Signs Blood pressure (!) 155/81, pulse 66, temperature 97.6 F (36.4 C), temperature source Oral, resp. rate 16, SpO2 99 %. Gen: no distress, normal appearing HEENT: oral mucosa pink and moist, NCAT Cardio: Reg rate Chest: normal effort, normal rate of breathing Abd: soft, non-distended Ext: no edema Psych: Normal mood.  Normal behavior. Musc: No edema in extremities.  No tenderness in extremities. Neuro: Alert Makes eye contact with examiner.   Follows commands.   Fair insight and awareness. Motor: LUE/LE: 5/5 proximal distal RUE: Shoulder abduction 3-/5 distally 2/5, stable RLE: 3-/5 HF, KE, 1+/5 ADF, stable  Assessment/Plan: 1. Functional deficits which require 3+ hours per day of interdisciplinary therapy in a comprehensive inpatient rehab setting.  Physiatrist is providing close team supervision and 24 hour management of active medical problems listed below.  Physiatrist and rehab team continue to assess barriers to discharge/monitor patient progress toward functional and medical goals  Care Tool:  Bathing    Body parts bathed by patient: Right arm,Left arm,Chest,Abdomen,Front perineal area,Buttocks,Right upper leg,Left upper leg,Left lower leg,Face   Body parts bathed by helper: Right lower  leg     Bathing assist Assist Level: Contact Guard/Touching assist     Upper Body Dressing/Undressing Upper body dressing   What is the patient wearing?: Pull over shirt    Upper body assist Assist Level: Supervision/Verbal cueing    Lower Body Dressing/Undressing Lower body dressing      What is the patient wearing?: Underwear/pull up     Lower body assist Assist for lower body dressing: Minimal Assistance - Patient > 75%     Toileting Toileting    Toileting assist Assist for toileting: Supervision/Verbal cueing     Transfers Chair/bed transfer  Transfers assist     Chair/bed transfer assist level: Contact Guard/Touching assist (quad cane)     Locomotion Ambulation   Ambulation assist      Assist level: Minimal Assistance - Patient > 75% Assistive device: Cane-quad Max distance: 137ft   Walk 10 feet activity   Assist     Assist level: Minimal Assistance - Patient > 75% Assistive device: Cane-quad   Walk 50 feet activity   Assist    Assist level: Minimal Assistance - Patient > 75% Assistive device: Cane-quad    Walk 150 feet activity   Assist Walk 150 feet activity did not occur: Safety/medical concerns (fatigue, R hemi, decreased balance, weakness)  Assist level: Minimal Assistance - Patient > 75%  Assistive device: Cane-quad    Walk 10 feet on uneven surface  activity   Assist     Assist level: Moderate Assistance - Patient - 50 - 74% Assistive device: Other (comment) (handrail)   Wheelchair     Assist Will patient use wheelchair at discharge?: Yes Type of Wheelchair: Manual    Wheelchair assist level: Independent Max wheelchair distance: >361ft    Wheelchair 50 feet with 2 turns activity    Assist        Assist Level: Independent   Wheelchair 150 feet activity     Assist      Assist Level: Independent   Blood pressure (!) 155/81, pulse 66, temperature 97.6 F (36.4 C), temperature source Oral,  resp. rate 16, SpO2 99 %.  Medical Problem List and Plan: 1.  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  Continue CIR  -Interdisciplinary Team Conference today  2.  Impaired mobility: d/c Lovenox since ambulating 400 feet             -antiplatelet therapy: Aspirin 325 mg daily and Plavix 75 mg daily x3 months then aspirin alone 3. Right shoulder pain: Continue lidoderm patch   Controlled on 5/24 4. Mood: Provide emotional support             -antipsychotic agents: N/A 5. Neuropsych: This patient is capable of making decisions on her own behalf. 6. Skin/Wound Care: Routine skin checks 7. Fluids/Electrolytes/Nutrition: Routine in and outs.  8.  Hypertension.  Continue Norvasc 10 mg daily, increase lisinopril to 10mg .              Monitor with increased mobility 9.  Hyperlipidemia: Lipitor 10.  History of diastolic congestive heart failure.               Monitor for signs and symptoms of fluid overload 11.  Tobacco abuse.  NicoDerm patch.  Provide counseling 12.  Thyroid nodule.  Incidental finding.  Outpatient percutaneous sampling. 13. Nausea  Zofran prn started on 5/20  Improved 14.  Hyponatremia  Sodium 132 on 5/19, 133 5/23 15.  Hypoalbuminemia  D/c Prosource since eating. Encourage high protein foods.    LOS: 6 days A FACE TO FACE EVALUATION WAS PERFORMED  6/23 03/11/2021, 6:39 AM

## 2021-03-11 NOTE — Progress Notes (Signed)
Occupational Therapy Session Note  Patient Details  Name: Charles Callahan MRN: [id number] Date of Birth: 1971-06-11  Today's Date: 03/11/2021 OT Individual Time: 1030-1130 OT Individual Time Calculation (min): 60 min    Short Term Goals: Week 1:  OT Short Term Goal 1 (Week 1): Patient will complete LB dressing/bathing using AE PRN with close spvsn. OT Short Term Goal 2 (Week 1): Patient will use RUE in BADL tasks at fine motor assist level with min vc's. OT Short Term Goal 3 (Week 1): Patient will complete 3/3 toileting tasks with close spvsn. OT Short Term Goal 4 (Week 1): Patient will complete dynamic standing ADLs with close spvsn.  Skilled Therapeutic Interventions/Progress Updates:   Met pt lying on sofa, agreed to tx. Pt expressed having no pain. Treatment session was focused on increasing UE ROM through therapeutic exercises and activities. OTS recommended pt be mindful of chair placement while transferring to wc.Pt was able to self-propel in wc through halls to rehab gym. Pt was close supervision for sit > stand transfers throughout session. Pt was able to complete shoulder flexion exercises (using a 3lb weight, wiping a table and cleaning a mirror). Pt often overexerts his shoulders causing a popping sound and required cues from OT and OTS to not push that hard. Pt was able to ambulate a short distance with CGA without RW to wall in rehab gym. Pt was able to complete wall push ups with and without a ball to increase shoulder ROM. OTS provided cues to adjust elbow alignment while completing wall push ups. Pt was close supervision using RW. Pt was able to cross midline using RUE during multiple activities (horsehoes and clean-up). Pt is still having difficulties fully extending elbow without cues during activities due to weakness. Left pt in room, all necessary needs met.    Therapy Documentation Precautions:  Precautions Precautions: Fall Precaution Comments: R hemi Restrictions Weight  Bearing Restrictions: No General:   Vital Signs:  Pain: Pain Assessment Pain Scale: 0-10 Pain Score: 0-No pain   Therapy/Group: Individual Therapy  Bayard Males 03/11/2021, 11:39 AM

## 2021-03-11 NOTE — Patient Care Conference (Signed)
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 Sula Soda Care Coodinator Present: Lavera Guise, BSW;Liany Mumpower Marlyne Beards, RN, BSN, CRRN Nurse Present: Kennyth Arnold, RN PT Present: Raechel Chute, PT OT Present: Rosalio Loud, OT SLP Present: Colin Benton, SLP PPS Coordinator present : Fae Pippin, SLP     Current Status/Progress Goal Weekly Team Focus  Bowel/Bladder   Pt continent of B/B . LBM 03/10/2021  Regular BMs every 3 days or less  Assess B/B every shift and PRN   Swallow/Nutrition/ Hydration   regular and thin liquids  Mod I  carryover and education   ADL's   Min assist LB dressing, CGA bathing, S UB dressing  Mod I  ADL retraining, dynamic standing balance, functional transfers, RUE NMR   Mobility   bed mobility supervision, transfers CGA/min A, gait 16ft with RW CGA, 4 steps 2 rails mod A  Mod I  functional mobility/transfers, generalized strengthening, dynamic standing balance/coordination, NMR, gait training, stair navigation, and improved activity tolerance.   Communication   supervison A  Mod I  speech intelligibility strategies   Safety/Cognition/ Behavioral Observations  supervision A  Mod I, supervision A  higehr lever problem solving, recall and education   Pain   Consistently denies pain.  Pain scale <3/10   Assess pain every shift and PRN   Skin   Skin intact  No breakdown  Assess skin every shift and PRN     Discharge Planning:      Team Discussion: No medical issues to report, continent of B/B.  Patient on target to meet rehab goals: yes, patient has Mod I goals, supervision for bed mobility, walking > 300 ft with RW and supervision. Will need a RW at discharge. Plan to shower tomorrow when sister brings in some clothing. Working on shoulder strengthening, stabilization. Contact guard overall. SLP reports he is supervision to mod I for cognition.  *See Care Plan and progress notes for long and short-term goals.   Revisions to Treatment Plan:  None at this time.  Teaching Needs: Family education, medication management, transfer training, gait training, balance training, stair training, endurance training, safety awareness.  Current Barriers to Discharge: Decreased caregiver support, Medical stability, Home enviroment access/layout, Lack of/limited family support, Medication compliance and Behavior  Possible Resolutions to Barriers: Continue current medications, provide emotional support.     Medical Summary Current Status: right shoulder pain, HTN, nausea, hyponatremia  Barriers to Discharge: Medical stability  Barriers to Discharge Comments: right shoulder pain, HTN, nausea, hyponatremia Possible Resolutions to [employer] Focus: continue lidoderm patch, Norvasc, monitoring Na weekly   Continued Need for Acute Rehabilitation Level of Care: The patient requires daily medical management by a physician with specialized training in physical medicine and rehabilitation for the following reasons: Direction of a multidisciplinary physical rehabilitation program to maximize functional independence : Yes Medical management of patient stability  for increased activity during participation in an intensive rehabilitation regime.: Yes Analysis of laboratory values and/or radiology reports  with any subsequent need for medication adjustment and/or medical intervention. : Yes   I attest that I was present, lead the team conference, and concur with the assessment and plan of the team.   Tennis Must 03/11/2021, 1:24 PM

## 2021-03-11 NOTE — Progress Notes (Signed)
Occupational Therapy Session Note  Patient Details  Name: Charles Callahan MRN: [id number] Date of Birth: 10/17/1971  Today's Date: 03/11/2021 OT Individual Time: 1258-1355 OT Individual Time Calculation (min): 57 min    Short Term Goals: Week 1:  OT Short Term Goal 1 (Week 1): Patient will complete LB dressing/bathing using AE PRN with close spvsn. OT Short Term Goal 2 (Week 1): Patient will use RUE in BADL tasks at fine motor assist level with min vc's. OT Short Term Goal 3 (Week 1): Patient will complete 3/3 toileting tasks with close spvsn. OT Short Term Goal 4 (Week 1): Patient will complete dynamic standing ADLs with close spvsn.  Skilled Therapeutic Interventions/Progress Updates:    Pt received taking a nap on the couch in his room and consented to OT tx. Pt dropped his phone through the cracks, was able to stand and retrieve it with no LOB. Pt taken down to ADL apartment, instructed in simulated kitchen tasks with reaching from various levels and opening/reaching in fridge while ambulating with RW and SUP. Pt instructed in standing task with FMC activity utilizing tacks and manipulating and placing tacks with R hand. Pt requires encouragement to rest and is highly motivated for all therapy activities. 9 hole peg test completed, pt scoring 34 seconds on R hand, 25 seconds on L hand demo'ing very mild R side deficits. Pt instructed in BUE strengthening HEP with 3# db. Trained in unilateral exercises including elbow flexion, shoulder press, bilateral shoulder flexion and chest press for 3x15 with min cuing for proper tech with good carryover. Pt required rest in between exercise sets 2/2 fatigue. Pt navigates through hallways and around obstacles in w/c with mod I. After tx, pt helped back to room and left in the w/c with all needs met.    Therapy Documentation Precautions:  Precautions Precautions: Fall Precaution Comments: R hemi Restrictions Weight Bearing Restrictions:  No   Pain: Pain Assessment Pain Scale: 0-10 Pain Score: 0-No pain   Therapy/Group: Individual Therapy     03/11/2021, 1:33 PM 

## 2021-03-11 NOTE — Progress Notes (Signed)
Physical Therapy Session Note  Patient Details  Name: Charles Callahan MRN: [id number] Date of Birth: 1970-12-27  Today's Date: 03/11/2021 PT Individual Time: 6222-9798 PT Individual Time Calculation (min): 69 min   Short Term Goals: Week 1:  PT Short Term Goal 1 (Week 1): pt will perform bed mobility with supervision PT Short Term Goal 2 (Week 1): pt will perform bed<>chair transfers with LRAD and CGA PT Short Term Goal 3 (Week 1): pt will ambulate 26ft with LRAD and CGA  Skilled Therapeutic Interventions/Progress Updates:   Received pt supine on couch in room, pt agreeable to therapy, and denied any pain during session. Educated pt on importance of remaining in [facility] or bed to have alarm on per hospital policy. Session with emphasis on functional mobility/transfers, generalized strengthening, dynamic standing balance/coordination, gait training, and improved activity tolerance. Stand<>pivot couch<>WC without AD and close supervision. Pt performed WC mobility 182ft x 1 and >342ft x 1 using bilateral LEs mod I to 4W dayroom with 1 rest break on elevator. Sit<>stand with BUE support and CGA and donned LiteGait harness standing with mod A. Pt stepped on/off LiteGait treadmill with CGA. Worked on gait training on LiteGait Treadmill for the following time frames: -8 minutes and 14 seconds for 535ft at incline 3 at 0.38mph for 2 minutes with BUE support, 1.5 minutes with R UE support, and 5.5 minutes without UE support with mild unsteadiness noted - cues for increased arm swing, to increase BOS, increased RLE foot clearance, and increased step length; no hyperextension noted -R side stepping for 3 minutes and 23 seconds for 132ft at 0.4 mph with BUE support (~ 30 seconds with no UE support but increased R knee hyperextension and unsteadiness). Pt with increased difficulty side stepping and with more hyperextension. -backwards walking for 8 minutes for 335ft starting at 0.87mph increasing to 0.54mph and then to  0. with BUE support for 4 minutes and no UE support for 4 minutes.  Pt required seated rest breaks in between due to RLE fatigue. Pt ambulated additional 358ft over level ground with RW and supervision. Pt demonstrated good carry over preventing R knee hyperextension and with step through pattern with good foot clearance. Performed BLE strengthening on Nustep at workload 5 for 11 minutes for a total of 500 steps for improved cardiovascular endurance and reciprocal movement training. Stand<>pivot Nustep<>WC without AD and close supervision. Pt performed WC mobility >348ft x 1 and 143ft x1 using BLE and mod I back to room. Concluded session with pt sitting in [facility] with all needs within reach. Provided pt with fresh drink and snack.   Therapy Documentation Precautions:  Precautions Precautions: Fall Precaution Comments: R hemi Restrictions Weight Bearing Restrictions: No  Therapy/Group: Individual Therapy Martin Majestic PT, DPT   03/11/2021, 7:13 AM

## 2021-03-12 LAB — CREATININE, SERUM
Creatinine, Ser: 0.97 mg/dL (ref 0.61–1.24)
GFR, Estimated: >60 mL/min (ref >60)

## 2021-03-12 MED ORDER — LISINOPRIL 10 MG PO TABS
10.0000 mg | ORAL_TABLET | Freq: Once | ORAL | Status: AC
  Administered 2021-03-12: 10 mg via ORAL
  Filled 2021-03-12: qty 1

## 2021-03-12 MED ORDER — LISINOPRIL 20 MG PO TABS
20.0000 mg | ORAL_TABLET | Freq: Every day | ORAL | Status: DC
  Administered 2021-03-13 – 2021-03-14 (×2): 20 mg via ORAL
  Filled 2021-03-12 (×2): qty 1

## 2021-03-12 NOTE — Progress Notes (Signed)
Speech Language Pathology Daily Session Note  Patient Details  Name: DAYVION SANS MRN: [id number] Date of Birth: 28-Oct-1970  Today's Date: 03/12/2021 SLP Individual Time: 9381-0175 SLP Individual Time Calculation (min): 22 min  Short Term Goals: Week 1: SLP Short Term Goal 1 (Week 1): Pt will utilize over articulation and increased vocal intensity strategies with Supervision A SLP Short Term Goal 2 (Week 1): Pt will understand, recall and utilize standard swallow precautions with Supervision A SLP Short Term Goal 3 (Week 1): Pt will recall functional/complex information with 80% accuracy min A SLP Short Term Goal 4 (Week 1): Pt will complete medication management task with min A  Skilled Therapeutic Interventions: Skilled SLP intervention focused on cognition. Educated pt on eliminating distractions prior to completin tasks related to finances and medications to avoid errors. Pt demonstrated understanding and use of pill organizer with BID pill container. Pts current medications are all taken in AM but encouraged pt to purchase BID organizer in case md makes changes with prescriptions. Pt ambulated to bathroom with 4 point cane with contact guard assist and supervision cues for steps with transfer. cont with therapy per plan of care.   Pain Pain Assessment Pain Scale: Faces Pain Score: 0-No pain Faces Pain Scale: No hurt  Therapy/Group: Individual Therapy  Carlean Jews Allister Lessley 03/12/2021, 2:41 PM

## 2021-03-12 NOTE — Progress Notes (Signed)
PROGRESS NOTE   Subjective/Complaints: Patient seen sitting up in bed this morning.  He states he slept well overnight.  He states he feels like he is having reflux.  ROS: + Reflux.  Denies CP, SOB, N/V/D.  Objective:   No results found. No results for input(s): WBC, HGB, HCT, PLT in the last 72 hours. Recent Labs    03/10/21 1105 03/12/21 0533  NA 132*  --   K 4.3  --   CL 102  --   CO2 26  --   GLUCOSE 103*  --   BUN 16  --   CREATININE 0.88 0.97  CALCIUM 12.0*  --     Intake/Output Summary (Last 24 hours) at 03/12/2021 1257 Last data filed at 03/12/2021 1240 Gross per 24 hour  Intake 617 ml  Output 1150 ml  Net -533 ml        Physical Exam: Vital Signs Blood pressure (!) 147/89, pulse 64, temperature 98.4 F (36.9 C), temperature source Oral, resp. rate 16, SpO2 99 %. Constitutional: No distress . Vital signs reviewed. HENT: Normocephalic.  Atraumatic. Eyes: EOMI. No discharge. Cardiovascular: No JVD.  RRR. Respiratory: Normal effort.  No stridor.  Bilateral clear to auscultation. GI: Non-distended.  BS +. Skin: Warm and dry.  Intact. Psych: Normal mood.  Normal behavior. Musc: No edema in extremities.  No tenderness in extremities. Neuro: Alert Makes eye contact with examiner.   Follows commands.   Fair insight and awareness. Motor: LUE/LE: 5/5 proximal distal RUE: Shoulder abduction 4-/5  RLE: 3-/5 HF, KE, 3+/5 ADF, unchanged  Assessment/Plan: 1. Functional deficits which require 3+ hours per day of interdisciplinary therapy in a comprehensive inpatient rehab setting.  Physiatrist is providing close team supervision and 24 hour management of active medical problems listed below.  Physiatrist and rehab team continue to assess barriers to discharge/monitor patient progress toward functional and medical goals  Care Tool:  Bathing    Body parts bathed by patient: Right arm,Left  arm,Chest,Abdomen,Front perineal area,Buttocks,Right upper leg,Left upper leg,Left lower leg,Face   Body parts bathed by helper: Right lower leg     Bathing assist Assist Level: Contact Guard/Touching assist     Upper Body Dressing/Undressing Upper body dressing   What is the patient wearing?: Pull over shirt    Upper body assist Assist Level: Supervision/Verbal cueing    Lower Body Dressing/Undressing Lower body dressing      What is the patient wearing?: Underwear/pull up     Lower body assist Assist for lower body dressing: Minimal Assistance - Patient > 75%     Toileting Toileting    Toileting assist Assist for toileting: Supervision/Verbal cueing     Transfers Chair/bed transfer  Transfers assist     Chair/bed transfer assist level: Supervision/Verbal cueing     Locomotion Ambulation   Ambulation assist      Assist level: Supervision/Verbal cueing Assistive device: Walker-rolling Max distance: 355ft   Walk 10 feet activity   Assist     Assist level: Supervision/Verbal cueing Assistive device: Walker-rolling   Walk 50 feet activity   Assist    Assist level: Supervision/Verbal cueing Assistive device: Walker-rolling    Walk 150  feet activity   Assist Walk 150 feet activity did not occur: Safety/medical concerns (fatigue, R hemi, decreased balance, weakness)  Assist level: Supervision/Verbal cueing Assistive device: Walker-rolling    Walk 10 feet on uneven surface  activity   Assist     Assist level: Moderate Assistance - Patient - 50 - 74% Assistive device: Other (comment) (handrail)   Wheelchair     Assist Will patient use wheelchair at discharge?: No Type of Wheelchair: Manual    Wheelchair assist level: Independent Max wheelchair distance: >367ft    Wheelchair 50 feet with 2 turns activity    Assist        Assist Level: Independent   Wheelchair 150 feet activity     Assist      Assist Level:  Independent   Blood pressure (!) 147/89, pulse 64, temperature 98.4 F (36.9 C), temperature source Oral, resp. rate 16, SpO2 99 %.  Medical Problem List and Plan: 1.  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  Continue CIR 2.  Impaired mobility: d/c Lovenox since ambulating 400 feet             -antiplatelet therapy: Aspirin 325 mg daily and Plavix 75 mg daily x3 months then aspirin alone 3. Right shoulder pain: Continue lidoderm patch   Controlled on 5/25 4. Mood: Provide emotional support             -antipsychotic agents: N/A 5. Neuropsych: This patient is capable of making decisions on her own behalf. 6. Skin/Wound Care: Routine skin checks 7. Fluids/Electrolytes/Nutrition: Routine in and outs.  8.  Hypertension.  Continue Norvasc 10 mg daily  Increased lisinopril to 20mg  on 5/25   Monitor with increased mobility 9.  Hyperlipidemia: Lipitor 10.  History of diastolic congestive heart failure.               Monitor for signs and symptoms of fluid overload 11.  Tobacco abuse.  NicoDerm patch.  Provide counseling 12.  Thyroid nodule.  Incidental finding.  Outpatient percutaneous sampling. 13. Nausea  Zofran prn started on 5/20  Improved 14.  Hyponatremia  Sodium 132 on 5/23 15.  Hypoalbuminemia  D/c Prosource since eating. Encourage high protein foods.   LOS: 7 days A FACE TO FACE EVALUATION WAS PERFORMED  Aayliah Rotenberry 6/23 03/12/2021, 12:57 PM

## 2021-03-12 NOTE — Progress Notes (Signed)
Physical Therapy Session Note  Patient Details  Name: Charles Callahan MRN: [id number] Date of Birth: 07/13/1971  Today's Date: 03/12/2021 PT Individual Time: 2025-4270 PT Individual Time Calculation (min): 54 min   Short Term Goals: Week 1:  PT Short Term Goal 1 (Week 1): pt will perform bed mobility with supervision PT Short Term Goal 2 (Week 1): pt will perform bed<>chair transfers with LRAD and CGA PT Short Term Goal 3 (Week 1): pt will ambulate 53ft with LRAD and CGA  Skilled Therapeutic Interventions/Progress Updates:    Received pt sitting in WC, pt agreeable to therapy, and denied any pain during session. Session with emphasis on functional mobility/transfers, generalized strengthening, dynamic standing balance/coordination, stair navigation, gait training, NMR, and improved activity tolerance. Pt performed WC mobility 1105ft x 2 and 181ft x 2 using BLE and mod I to/from 4W therapy gym. Pt navigated 52 steps alternating using 1-2 handrails and close supervision ascending and descending with a step through pattern. Pt then ambulated 479ft with large based quad cane and supervision. Pt with good awareness of RLE foot drag with fatigue and able to self-cue to increase step length. Ambulated to/from // bars without AD and CGA. Inside // bars worked on dynamic standing balance performing the following activities with supervision/CGA: -Rockerboard static standing without UE support for 1 minute x 2 trials; pt with multiple LOB but able to self-correct without assist -Rockerboard lateral taps with 1 UE support fading to 2 fingertip support 2x20 bilaterally; 1 instance of LOB -Rockerboard squats with no UE support 2x10 with close supervision and no LOB noted -lunges on Bosu x10 bilaterally with CGA/light min A -hip abduction standing on Bosu with 2 fingertip support x10 bilaterally  Concluded session with pt sitting in WC with all needs within reach. Provided pt with fresh drink/snack.    Therapy  Documentation Precautions:  Precautions Precautions: Fall Precaution Comments: R hemi Restrictions Weight Bearing Restrictions: No   Therapy/Group: Individual Therapy Martin Majestic PT, DPT   03/12/2021, 7:38 AM

## 2021-03-12 NOTE — Discharge Summary (Signed)
Physician Discharge Summary  Patient ID: Charles Callahan MRN: [id number] DOB/AGE: 1971-07-17 50 y.o.  Admit date: 03/05/2021 Discharge date: 03/14/2021  Discharge Diagnoses:  Principal Problem:   Subcortical infarction [facility]) Active Problems:   Hypoalbuminemia due to protein-calorie malnutrition (HCC)   Hyponatremia   Essential hypertension   Nausea without vomiting Hyperlipidemia History of diastolic congestive heart failure Tobacco abuse Incidental finding thyroid nodule  Discharged Condition: Stable  Significant Diagnostic Studies: CT ANGIO HEAD NECK W WO CM  Result Date: 02/28/2021 CLINICAL DATA:  Right arm weakness and numbness, left cerebral infarcts on MRI EXAM: CT ANGIOGRAPHY HEAD AND NECK TECHNIQUE: Multidetector CT imaging of the head and neck was performed using the standard protocol during bolus administration of intravenous contrast. Multiplanar CT image reconstructions and MIPs were obtained to evaluate the vascular anatomy. Carotid stenosis measurements (when applicable) are obtained utilizing NASCET criteria, using the distal internal carotid diameter as the denominator. CONTRAST:  50mL OMNIPAQUE IOHEXOL 350 MG/ML SOLN COMPARISON:  CT head 02/27/2021. Correlation made with MRI 02/27/2021 FINDINGS: CT HEAD Brain: Multiple small left cerebral hemisphere infarcts are better seen on the prior MRI. There is no acute intracranial hemorrhage. No new loss of gray-white differentiation. Stable findings of probable chronic microvascular ischemic changes. No extra-axial fluid collection. Vascular: No new findings. Skull: Calvarium is unremarkable. Sinuses/Orbits: No acute finding. Other: None. Review of the MIP images confirms the above findings CTA NECK Aortic arch: Great vessel origins are patent. Right carotid system: Patent.  No stenosis at the ICA origins. Left carotid system: Patent.  No stenosis at the ICA origin. Vertebral arteries: Patent and codominant.  No stenosis. Skeleton: Mild  degenerative changes of the cervical spine. Other neck: As seen on recent chest CT, there is a 3-4 cm mass abutting or arising from the left thyroid lobe. Upper chest: Visualized lung apices are clear. Review of the MIP images confirms the above findings CTA HEAD Anterior circulation: Intracranial internal carotid arteries are patent with mild calcified plaque. Anterior cerebral arteries are patent. Right A1 ACA is hypoplastic. Right middle cerebral artery is patent. There is severe stenosis of the proximal to mid left M1 MCA. Flow within the distal left MCA branches appears relatively decreased compared to the right. Posterior circulation: Intracranial vertebral arteries are patent. Basilar artery is patent. Major cerebellar artery origins patent. Posterior cerebral arteries are patent. Venous sinuses: Patent as allowed by contrast bolus timing. Review of the MIP images confirms the above findings IMPRESSION: No acute intracranial hemorrhage. Small infarcts better seen on prior MRI. No hemodynamically significant stenosis in the neck. Severe left M1 MCA stenosis with reduced flow in the distal left MCA branches compared to the right. 3-4 cm mass abutting or arising from the left thyroid lobe. Ultrasound is recommended for further evaluation. Electronically Signed   By: Guadlupe Spanish M.D.   On: 02/28/2021 11:39   DG Chest 2 View  Result Date: 02/28/2021 CLINICAL DATA:  Chest pain. EXAM: CHEST - 2 VIEW COMPARISON:  02/16/2021 FINDINGS: The lungs are clear without focal pneumonia, edema, pneumothorax or pleural effusion. Interstitial markings are diffusely coarsened with chronic features. The cardio pericardial silhouette is enlarged. The visualized bony structures of the thorax show no acute abnormality. Telemetry leads overlie the chest. IMPRESSION: No active cardiopulmonary disease. Electronically Signed   By: Kennith Center M.D.   On: 02/28/2021 07:32   DG Chest 2 View  Result Date: 02/16/2021 CLINICAL  DATA:  Chest pain for 1 hour EXAM: CHEST - 2 VIEW COMPARISON:  03/04/2017 FINDINGS: The heart size and mediastinal contours are within normal limits. Both lungs are clear. The visualized skeletal structures are unremarkable. IMPRESSION: No active cardiopulmonary disease. Electronically Signed   By: Elige Ko   On: 02/16/2021 15:20   CT HEAD WO CONTRAST  Result Date: 02/27/2021 CLINICAL DATA:  Right arm weakness over the last 2 days. Question stroke. EXAM: CT HEAD WITHOUT CONTRAST TECHNIQUE: Contiguous axial images were obtained from the base of the skull through the vertex without intravenous contrast. COMPARISON:  None. FINDINGS: Brain: No focal abnormality seen affecting the brainstem or cerebellum. Cerebral hemispheres show low-density within the white matter consistent with small vessel ischemic change. The age of these white matter insults is indeterminate. Whereas most are likely old, a recent white matter infarction is not excluded. No cortical or large vessel territory infarction is seen. No mass, hemorrhage, hydrocephalus or extra-axial collection. Vascular: There is atherosclerotic calcification of the major vessels at the base of the brain. Skull: Negative Sinuses/Orbits: Clear/normal Other: None IMPRESSION: No acute finding by CT. Areas of low-density in the cerebral hemispheric white matter consistent with small vessel ischemic change. This is presumed to be chronic, but a recent small vessel infarction could be hidden within the chronic insults. Electronically Signed   By: Paulina Fusi M.D.   On: 02/27/2021 19:05   MR BRAIN WO CONTRAST  Result Date: 02/27/2021 CLINICAL DATA:  Initial evaluation for neuro deficit, stroke suspected, right-sided weakness. EXAM: MRI HEAD WITHOUT CONTRAST TECHNIQUE: Multiplanar, multiecho pulse sequences of the brain and surrounding structures were obtained without intravenous contrast. COMPARISON:  Prior CT from earlier the same day. FINDINGS: Brain: Cerebral  volume within normal limits for age. Patchy T2/FLAIR hyperintensity within the periventricular and deep white matter both cerebral hemispheres most consistent with chronic small vessel ischemic disease, moderate for age. Patchy involvement of the pons noted. Superimposed small remote lacunar infarct at the right thalamus. Multiple scattered cortical and subcortical foci of restricted diffusion seen involving the left cerebral hemisphere, with involvement of the left frontal, parietal, temporal, and occipital lobes. For reference purposes, the largest distinct area of infarction seen at the posterior left frontal corona radiata and measures approximately 1 cm. No associated hemorrhage or mass effect. No other evidence for acute or subacute ischemia. Gray-white matter differentiation otherwise maintained. Multiple scattered punctate chronic micro hemorrhages noted about the cerebellum and thalami, likely related to chronic underlying hypertension. No mass lesion, midline shift or mass effect. No hydrocephalus or extra-axial fluid collection. Pituitary gland suprasellar region within normal limits. Midline structures intact. Vascular: Major intracranial vascular flow voids are maintained. Skull and upper cervical spine: Craniocervical junction normal. Bone marrow signal intensity within normal limits. No scalp soft tissue abnormality. Sinuses/Orbits: Globes and orbital soft tissues within normal limits. Scattered mucosal thickening noted within the ethmoidal air cells and maxillary sinuses. Paranasal sinuses are otherwise clear. Trace bilateral mastoid effusions, of doubtful significance. Other: None. IMPRESSION: 1. Multiple scattered cortical and subcortical acute ischemic nonhemorrhagic left cerebral infarcts as above. No associated hemorrhage or mass effect. 2. Underlying moderate chronic microvascular ischemic disease. 3. Multiple chronic micro hemorrhages clustered about the cerebellum and thalami, suggesting  chronic poorly controlled hypertension. Electronically Signed   By: Rise Mu M.D.   On: 02/27/2021 22:53   US THYROID  Result Date: 03/01/2021 CLINICAL DATA:  Palpable abnormality.  Palpable thyroid nodule. EXAM: THYROID ULTRASOUND TECHNIQUE: Ultrasound examination of the thyroid gland and adjacent soft tissues was performed. COMPARISON:  None. FINDINGS: Parenchymal Echotexture: Normal Isthmus:  Normal in size measuring 0.2 cm in diameter Right lobe: Normal in size measuring 5.3 x 2.0 x 2.2 cm Left lobe: Enlarged measuring 6.0 x 3.0 x 2.6 cm _________________________________________________________ Estimated total number of nodules >/= 1 cm: 1 Number of spongiform nodules >/=  2 cm not described below (TR1): 0 Number of mixed cystic and solid nodules >/= 1.5 cm not described below (TR2): 0 _________________________________________________________ Nodule # 1: Location: Left; Inferior Maximum size: 3.7 cm; Other 2 dimensions: 3.5 x 3.4 cm Composition: solid/almost completely solid (2) Echogenicity: hypoechoic (2) Shape: taller-than-wide (3) Margins: lobulated/irregular (2) Echogenic foci: none (0) ACR TI-RADS total points: 9. ACR TI-RADS risk category: TR5 (>/= 7 points). ACR TI-RADS recommendations: **Given size (>/= 1.0 cm) and appearance, fine needle aspiration of this highly suspicious nodule should be considered based on TI-RADS criteria. _________________________________________________________ IMPRESSION: Solitary 3.7 cm TR5 (highly suspicious) right-sided thyroid nodule meets imaging criteria to recommend percutaneous sampling. The above is in keeping with the ACR TI-RADS recommendations - J Am Coll Radiol 2017;14:587-595. Electronically Signed   By: Simonne Come M.D.   On: 03/01/2021 09:49   ECHOCARDIOGRAM COMPLETE BUBBLE STUDY  Result Date: 03/01/2021    ECHOCARDIOGRAM REPORT   Patient Name:   Charles Callahan Date of Exam: 03/01/2021 Medical Rec #:  [id number]    Height:       66.5 in Accession  #:    [id number]   Weight:       225.0 lb Date of Birth:  [birth year]    BSA:          2.114 m Patient Age:    49 years     BP:           187/109 mmHg Patient Gender: M            HR:           62 bpm. [facility] Procedure: 2D Echo, Cardiac Doppler and Color Doppler Indications:    Stroke l63.9  History:        Patient has prior history of Echocardiogram examinations, most                 recent 04/11/2009. CHF; Risk Factors:Hypertension. Substance use                 disorder.  Sonographer:    Celesta Gentile RCS Referring Phys: [id number] WHITNEY PLUNKETT IMPRESSIONS  1. Left ventricular ejection fraction, by estimation, is 55 to 60%. The left ventricle has normal function. The left ventricle has no regional wall motion abnormalities. There is moderate left ventricular hypertrophy. Left ventricular diastolic parameters are consistent with Grade II diastolic dysfunction (pseudonormalization).  2. Right ventricular systolic function is normal. The right ventricular size is normal. Tricuspid regurgitation signal is inadequate for assessing PA pressure.  3. Left atrial size was moderately dilated.  4. There is a trivial pericardial effusion posterior to the left ventricle.  5. The mitral valve is grossly normal. Mild mitral valve regurgitation.  6. The aortic valve is tricuspid. Aortic valve regurgitation is not visualized.  7. The inferior vena cava is normal in size with greater than 50% respiratory variability, suggesting right atrial pressure of 3 mmHg. FINDINGS  Left Ventricle: Left ventricular ejection fraction, by estimation, is 55 to 60%. The left ventricle has normal function. The left ventricle has no regional wall motion abnormalities. The left ventricular internal cavity size was normal in size. There is  moderate left ventricular hypertrophy. Left ventricular diastolic  parameters are consistent with Grade II diastolic dysfunction (pseudonormalization). Right Ventricle: The right ventricular size is  normal. No increase in right ventricular wall thickness. Right ventricular systolic function is normal. Tricuspid regurgitation signal is inadequate for assessing PA pressure. Left Atrium: Left atrial size was moderately dilated. Right Atrium: Right atrial size was normal in size. Pericardium: Trivial pericardial effusion is present. The pericardial effusion is posterior to the left ventricle. Mitral Valve: The mitral valve is grossly normal. Mild mitral valve regurgitation. Tricuspid Valve: The tricuspid valve is grossly normal. Tricuspid valve regurgitation is trivial. Aortic Valve: The aortic valve is tricuspid. There is mild to moderate aortic valve annular calcification. Aortic valve regurgitation is not visualized. Pulmonic Valve: The pulmonic valve was grossly normal. Pulmonic valve regurgitation is trivial. Aorta: The aortic root is normal in size and structure. Venous: The inferior vena cava is normal in size with greater than 50% respiratory variability, suggesting right atrial pressure of 3 mmHg. IAS/Shunts: No atrial level shunt detected by color flow Doppler.  LEFT VENTRICLE PLAX 2D LVIDd:         5.20 cm  Diastology LVIDs:         3.60 cm  LV e' medial:    4.68 cm/s LV PW:         1.20 cm  LV E/e' medial:  15.7 LV IVS:        1.40 cm  LV e' lateral:   5.77 cm/s LVOT diam:     2.10 cm  LV E/e' lateral: 12.8 LV SV:         56 LV SV Index:   27 LVOT Area:     3.46 cm  RIGHT VENTRICLE RV S prime:     10.90 cm/s TAPSE (M-mode): 2.0 cm LEFT ATRIUM             Index       RIGHT ATRIUM           Index LA diam:        4.40 cm 2.08 cm/m  RA Area:     20.50 cm LA Vol (A2C):   77.5 ml 36.66 ml/m RA Volume:   65.10 ml  30.79 ml/m LA Vol (A4C):   93.2 ml 44.08 ml/m LA Biplane Vol: 90.5 ml 42.80 ml/m  AORTIC VALVE LVOT Vmax:   80.90 cm/s LVOT Vmean:  51.400 cm/s LVOT VTI:    0.162 m  AORTA Ao Root diam: 3.10 cm MITRAL VALVE MV Area (PHT): 4.80 cm    SHUNTS MV Decel Time: 158 msec    Systemic VTI:  0.16 m MV E  velocity: 73.70 cm/s  Systemic Diam: 2.10 cm MV A velocity: 62.40 cm/s MV E/A ratio:  1.18 Nona Dell MD Electronically signed by Nona Dell MD Signature Date/Time: 03/01/2021/12:47:23 PM    Final    CT Angio Chest/Abd/Pel for Dissection W and/or Wo Contrast  Result Date: 02/16/2021 CLINICAL DATA:  Lambert Mody left-sided chest pain for 1 hour with shortness of breath and nausea. EXAM: CT ANGIOGRAPHY CHEST, ABDOMEN AND PELVIS TECHNIQUE: Non-contrast CT of the chest was initially obtained. Multidetector CT imaging through the chest, abdomen and pelvis was performed using the standard protocol during bolus administration of intravenous contrast. Multiplanar reconstructed images and MIPs were obtained and reviewed to evaluate the vascular anatomy. CONTRAST:  OMNIPAQUE IOHEXOL 350 MG/ML SOLN COMPARISON:  None. FINDINGS: CTA CHEST FINDINGS Cardiovascular: There is no evidence for a thoracic aortic aneurysm or dissection. There are minimal atherosclerotic changes of the  thoracic aorta. There is no large centrally located pulmonary embolism. No significant pericardial effusion. There is mild cardiomegaly. Mediastinum/Nodes: -- No mediastinal lymphadenopathy. -- No hilar lymphadenopathy. -- No axillary lymphadenopathy. -- No supraclavicular lymphadenopathy. --there is a large left-sided thyroid nodule measuring approximately 4.4 x 3.4 cm arising from the inferior left thyroid gland. -  Unremarkable esophagus. Lungs/Pleura: Airways are patent. No pleural effusion, lobar consolidation, pneumothorax or pulmonary infarction. Musculoskeletal: No chest wall abnormality. No bony spinal canal stenosis. Review of the MIP images confirms the above findings. CTA ABDOMEN AND PELVIS FINDINGS VASCULAR Aorta: Normal caliber aorta without aneurysm, dissection, vasculitis or significant stenosis. Celiac: Patent without evidence of aneurysm, dissection, vasculitis or significant stenosis. SMA: Patent without evidence of aneurysm,  dissection, vasculitis or significant stenosis. Renals: Both renal arteries are patent without evidence of aneurysm, dissection, vasculitis, fibromuscular dysplasia or significant stenosis. There are 2 right renal arteries. IMA: Patent without evidence of aneurysm, dissection, vasculitis or significant stenosis. Inflow: Patent without evidence of aneurysm, dissection, vasculitis or significant stenosis. Veins: No obvious venous abnormality within the limitations of this arterial phase study. Review of the MIP images confirms the above findings. NON-VASCULAR Hepatobiliary: The liver is normal. Normal gallbladder.There is no biliary ductal dilation. Pancreas: Normal contours without ductal dilatation. No peripancreatic fluid collection. Spleen: Unremarkable. Adrenals/Urinary Tract: --Adrenal glands: Unremarkable. --Right kidney/ureter: No hydronephrosis or radiopaque kidney stones. --Left kidney/ureter: No hydronephrosis or radiopaque kidney stones. --Urinary bladder: Unremarkable. Stomach/Bowel: --Stomach/Duodenum: No hiatal hernia or other gastric abnormality. Normal duodenal course and caliber. --Small bowel: Unremarkable. --Colon: Unremarkable. --Appendix: Normal. Vascular/Lymphatic: Normal course and caliber of the major abdominal vessels. --No retroperitoneal lymphadenopathy. --No mesenteric lymphadenopathy. --No pelvic or inguinal lymphadenopathy. Reproductive: Unremarkable Other: No ascites or free air. The abdominal wall is normal. Musculoskeletal. No acute displaced fractures. Review of the MIP images confirms the above findings. IMPRESSION: 1. No evidence for an aortic dissection.  No acute abnormality. 2. Large left-sided thyroid nodule. Outpatient thyroid ultrasound follow-up is recommended.(Ref: J Am Coll Radiol. 2015 Feb;12(2): 143-50). 3.  Aortic Atherosclerosis (ICD10-I70.0). Electronically Signed   By: Katherine Mantle M.D.   On: 02/16/2021 18:14    Labs:  Basic Metabolic Panel: Recent Labs   Lab 03/10/21 1105 03/12/21 0533  NA 132*  --   K 4.3  --   CL 102  --   CO2 26  --   GLUCOSE 103*  --   BUN 16  --   CREATININE 0.88 0.97  CALCIUM 12.0*  --     CBC: No results for input(s): WBC, NEUTROABS, HGB, HCT, MCV, PLT in the last 168 hours.  CBG: No results for input(s): GLUCAP in the last 168 hours.  Family history.  Mother with diabetes and hypertension.  Denies any colon cancer esophageal cancer or rectal cancer   Brief HPI:   LARK RUNK is a 49 y.o. right-handed male with history of diastolic congestive heart failure as well as hypertension and tobacco use.  Patient lives with parent.  Independent prior to admission.  1 level home with level entry.  Presented 02/27/2021 with acute onset of right side weakness.  Cranial CT scan unremarkable for acute intracranial process.  Area of low density in the cerebral hemisphere white matter consistent with small vessel ischemic change.  Patient did not receive tPA.  MRI showed multiple scattered cortical and subcortical acute ischemic nonhemorrhagic left cerebral infarctions.  No associated hemorrhage or mass-effect.  CT angiogram of the head and neck showed no hemodynamically significant stenosis of  the neck however there was a 3-4 cm mass abutting or arising from the left thyroid lobe.  Ultrasound of thyroid showed a solitary 3.7 cm right thyroid nodule recommending percutaneous sampling which could  be done as outpatient.  Echocardiogram with ejection fraction of 55 to 60% no wall motion abnormalities grade 2 diastolic dysfunction.  Admission chemistries unremarkable except sodium 133 urine drug screen negative alcohol negative urinalysis negative nitrite.  Currently maintained on aspirin 325 mg daily and Plavix 75 mg daily for CVA prophylaxis x3 months then aspirin alone.  Subcutaneous Lovenox for DVT prophylaxis.  Therapy evaluations completed due to patient's right side weakness was admitted for a comprehensive rehab  program.   Hospital Course: DEWIGHT CATINO was admitted to rehab 03/05/2021 for inpatient therapies to consist of PT, ST and OT at least three hours five days a week. Past admission physiatrist, therapy team and rehab RN have worked together to provide customized collaborative inpatient rehab.  Pertaining to patient's multiple scattered cortical and subcortical ischemic nonhemorrhagic left cerebral infarctions remained stable.  Patient would remain on aspirin and Plavix x3 months then aspirin alone.  Patient had been on Lovenox discontinued as he was now ambulating greater than 400 feet.  Blood pressure controlled on Norvasc's as well as lisinopril and would need outpatient follow-up.  Lipitor ongoing for hyperlipidemia.  Noted history of diastolic congestive heart failure exhibited no signs of fluid overload.  He did have a history of tobacco use maintained on NicoDerm patch provided with counseling.  Incidental finding of thyroid nodule outpatient percutaneous sampling recommended.   Blood pressures were monitored on TID basis and controlled     Rehab course: During patient's stay in rehab weekly team conferences were held to monitor patient's progress, set goals and discuss barriers to discharge. At admission, patient required minimal assist 16 feet with rail in hallway.  Minimal assist sit to stand minimal guard stand pivot transfers minimal assist eating minimal assist upper body dressing minimal assist lower body dressing  Physical exam.  Blood pressure 161/93 pulse 55 temperature 97 respirations 18 oxygen saturations 99% room air Constitutional.  No acute distress HEENT Head.  Normocephalic and atraumatic Eyes.  Pupils round and reactive to light no discharge without nystagmus Neck.  Supple nontender no JVD without thyromegaly Cardiac regular rate rhythm without extra sounds or murmur heard Abdomen.  Soft nontender positive bowel sounds without rebound Respiratory effort normal no  respiratory distress without wheeze Musculoskeletal normal range of motion no edema or tenderness in extremities Skin.  Warm and dry Neurologic.  Alert oriented makes eye contact with examiner follows commands fair insight and awareness Motor.  Left upper extremity/lower extremity 5/5 proximal distal Right upper extremity shoulder abduction 2+/5 distally 2 -/5 Right lower extremity 3 -/5 hip flexors, knee extension, 1/5 dorsi plantarflexion.  He/She  has had improvement in activity tolerance, balance, postural control as well as ability to compensate for deficits. He/She has had improvement in functional use RUE/LUE  and RLE/LLE as well as improvement in awareness.  Sessions focused on functional ability transfers and generalized strengthening.  Stand pivot to the couch wheelchair without assistive device close supervision.  Perform wheelchair mobility using bilateral lower extremities modified independent.  Sit to stand with bilateral upper extremity support contact-guard assist.  Worked on gait training treadmill 8 minutes 14 seconds 563 feet at an incline of 3 0.8 miles an hour.  Instructed in simulated kitchen task with reaching from various levels and opening and reaching inside the refrigerator  while ambulating rolling walker and standby assist.  He can gather his belongings for ADLs.  Full family teaching completed plan discharged to home       Disposition: Discharge disposition: 01-Home or Self Care     Discharge to home   Diet: Regular  Special Instructions: No driving smoking or alcohol   Plan aspirin 325 mg daily and Plavix 75 mg daily x3 months then aspirin alone  Medications at discharge. 1.  Tylenol as needed 2.  Norvasc 10 mg p.o. daily 3.  Aspirin 325 mg p.o. daily 4.  Lipitor 80 mg p.o. daily 5.  Vitamin D 1000 units p.o. daily 6.  Plavix 75 mg p.o. daily 7.  Lidoderm patch as directed 8.  Lisinopril 10 mg p.o. daily 9.  NicoDerm patch as directed 10.  Protonix  40 mg p.o. daily  30-35 minutes were spent completing discharge summary and discharge planning  Discharge Instructions    Ambulatory referral to Neurology   Complete by: As directed    An appointment is requested in approximately 4 weeks subcortical CVA   Ambulatory referral to Physical Medicine Rehab   Complete by: As directed    Moderate complexity follow-up 1 to 2 weeks subcortical CVA       Follow-up Information    Raulkar, Drema PryKrutika P, MD Follow up.   Specialty: Physical Medicine and Rehabilitation Why: 03/27/21 please arrive at 10:20 for 10:40am appointment, thank you! Contact information: [address] [place] [id number] [phone]        Anders SimmondsMcClung, Angela M, PA-C Follow up on 04/16/2021.   Specialty: Family Medicine Why: Appointment @ 10:10 Am Contact information: [address] [place] [id number] [phone]               Signed: Charlton AmorDaniel J Jenelle Drennon 03/14/2021, 5:19 AM

## 2021-03-12 NOTE — Progress Notes (Signed)
Speech Language Pathology Daily Session Note  Patient Details  Name: Charles Callahan MRN: [id number] Date of Birth: [birth year]  Today's Date: 03/12/2021 SLP Individual Time: 0940-1000 SLP Individual Time Calculation (min): 20 min  Short Term Goals: Week 1: SLP Short Term Goal 1 (Week 1): Pt will utilize over articulation and increased vocal intensity strategies with Supervision A SLP Short Term Goal 2 (Week 1): Pt will understand, recall and utilize standard swallow precautions with Supervision A SLP Short Term Goal 3 (Week 1): Pt will recall functional/complex information with 80% accuracy min A SLP Short Term Goal 4 (Week 1): Pt will complete medication management task with min A  Skilled Therapeutic Interventions:  Skilled SLP interventions focused on cognition. Pt demonstrated  awareness of physical limitations and tasks he will need assistance to complete once home. He stated he was looking forward to cooking and doing laundry and was able to problem solve with supervision A from SLP ways to make this task easier to eliminate falls. He recalled all medications for stroke prevention using medication list and demonstrated good understanding of time taken for each medication.      Pain Pain Assessment Pain Scale: Faces Pain Score: 0-No pain  Therapy/Group: Individual Therapy  Charles Callahan Charles Callahan 03/12/2021, 12:31 PM

## 2021-03-12 NOTE — Progress Notes (Signed)
Patient ID: Charles Callahan, male   DOB: [birth year], 50 y.o.   MRN: [id number]  [facility] ordered through [employer].  [place], [place] [phone]

## 2021-03-12 NOTE — Consult Note (Signed)
Neuropsychological Consultation   Patient:   Charles Callahan   DOB:   1971/03/29  MR Number:  [id number]  Location:  [facility] [address] CENTER A [address] [id number] [place] [address] Dept: [phone] Loc: [phone]           Date of Service:   03/12/2021  Start Time:   3 PM End Time:   4 PM  Provider/Observer:  Arley Phenix, Psy.D.       Clinical Neuropsychologist       Billing Code/Service: [phone]  Chief Complaint:    Charles Callahan is a 50 year old male with a history of diastolic congestive heart failure as well as hypertension and tobacco use.  The patient reports that he was not taking any medications for his cardiovascular issues and hypertension.  The patient had continued to smoke as well.  The patient presented on 02/27/2021 with acute onset of right-sided hemiparesis.  Cranial CT scan was unremarkable for acute process.  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  There was no associated hemorrhage or mass-effect.  Patient has been maintained more recently on 325 mg of aspirin daily and Plavix for CVA prophylaxis for the first 3 months and then aspirin alone.  Subcutaneous Lovenox for DVT has also been started.  Patient had continued with right hemiparesis and was admitted to the CIR program for intensive inpatient therapeutic interventions post stroke.  Reason for Service:  The patient was referred for neuropsychological consultation due to adjustment and coping issues following recent CVA.  Below is the HPI for the current admission.  HPI: Charles Callahan. Hyser is a 50 year old right-handed male history of diastolic congestive heart failure as well as hypertension and tobacco use.  Per chart review lives with parent.  Independent prior to admission.  1 level home with level entry.  History taken from chart review and patient.  He presented on 02/27/2021  with acute onset of right side hemiparesis.  Cranial CT scan unremarkable for acute intracranial process.  Area of low density in the cerebral hemisphere white matter consistent with small vessel ischemic change.  Patient did not receive tPA.  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  No associated hemorrhage or mass-effect.  CT angiogram of the head and neck showed no hemodynamically significant stenosis of the neck however there was a 3-4 cm mass abutting or arising from the left thyroid lobe.  Ultrasound of thyroid showed a solitary 3.7 cm right thyroid nodule recommending percutaneous sampling which could be done as outpatient.  Echocardiogram with ejection fraction 55 to 60%, no wall motion abnormalities grade 2 diastolic dysfunction.  Admission chemistries unremarkable except sodium 133, urine drug screen negative alcohol negative urinalysis negative nitrite.  Currently maintained on aspirin 325 mg daily and Plavix 75 mg daily for CVA prophylaxis x3 months then aspirin alone.  Subcutaneous Lovenox for DVT prophylaxis.  Tolerating a regular diet.  Therapy evaluations completed due to patient's right hemiparesis was admitted for a comprehensive rehab program. Please see preadmission assessment from earlier today as well.  Current Status:  Upon entering the room, the patient was seated in his wheelchair and was alert and oriented with good receptive and expressive language functions.  Cognition and mental status were appropriate.  The patient acknowledged that he had not been managing his cardiovascular issues and he continued to smoke and insisted that he was going to be  much more vigilant going forward with his medical care and was planning on stopping smoking and had been without cigarettes since his hospitalization and planned on continuing with this.  The patient reports that he has noticed 2 specific issues.  1 is the ongoing motor  deficits although he is able to move his right leg and arm but has limited motion and strength.  The patient reports that he is also noted that his information processing speed appears slower than before his stroke but has been improving.  The patient denies any significant depression or anxiety based symptoms and remains motivated to continue with progress in therapies and has also motivated to continue with outpatient PT/OT once he is discharged.  The patient has family support and appears to be coping and managing fairly well given the situation.   Behavioral Observation: Charles Callahan  presents as a 50 y.o.-year-old Right handed African American Male who appeared his stated age. his dress was Appropriate and he was Well Groomed and his manners were Appropriate to the situation.  his participation was indicative of Appropriate and Attentive behaviors.  There were physical disabilities noted.  he displayed an appropriate level of cooperation and motivation.     Interactions:    Active Appropriate and Attentive  Attention:   within normal limits and attention span and concentration were age appropriate  Memory:   within normal limits; recent and remote memory intact  Visuo-spatial:  not examined  Speech (Volume):  normal  Speech:   normal; some mild slurring noted likely due to motor changes  Thought Process:  Coherent and Relevant  Though Content:  WNL; not suicidal and not homicidal  Orientation:   person, place, time/date and situation  Judgment:   Good  Planning:   Fair  Affect:    Appropriate  Mood:    Euthymic  Insight:   Good  Intelligence:   normal  Medical History:   Past Medical History:  Diagnosis Date  . CHF (congestive heart failure) (HCC)   . Hypertension          Patient Active Problem List   Diagnosis Date Noted  . Nausea without vomiting   . Hypoalbuminemia due to protein-calorie malnutrition (HCC)   . Hyponatremia   . Essential hypertension   .  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 Chronic diastolic congestive heart failure (HCC)   . Dyslipidemia   . Right hemiparesis (HCC)   . Acute CVA (cerebrovascular accident) (HCC) 02/28/2021  . Substance use disorder 02/28/2021  . OBESITY 05/13/2009  . Hypertension 05/13/2009  . STROKE 05/13/2009  . BACK PAIN 05/13/2009  . SHORTNESS OF BREATH 05/13/2009  . CHEST PAIN 05/13/2009    Psychiatric History:  No prior psychiatric history noted.  Family Med/Psych History:  Family History  Problem Relation Age of Onset  . Diabetes Mother   . Hypertension Mother     Impression/DX:  ANGELICA WIX is a 50 year old male with a history of diastolic congestive heart failure as well as hypertension and tobacco use.  The patient reports that he was not taking any medications for his cardiovascular issues and hypertension.  The patient had continued to smoke as well.  The patient presented on 02/27/2021 with acute onset of right-sided hemiparesis.  Cranial CT scan was unremarkable for acute process.  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  There was no associated hemorrhage or mass-effect.  Patient has been maintained more recently on 325 mg of  aspirin daily and Plavix for CVA prophylaxis for the first 3 months and then aspirin alone.  Subcutaneous Lovenox for DVT has also been started.  Patient had continued with right hemiparesis and was admitted to the CIR program for intensive inpatient therapeutic interventions post stroke.  Upon entering the room, the patient was seated in his wheelchair and was alert and oriented with good receptive and expressive language functions.  Cognition and mental status were appropriate.  The patient acknowledged that he had not been managing his cardiovascular issues and he continued to smoke and insisted that he was going to be much more vigilant going forward with his medical care and was planning on  stopping smoking and had been without cigarettes since his hospitalization and planned on continuing with this.  The patient reports that he has noticed 2 specific issues.  1 is the ongoing motor deficits although he is able to move his right leg and arm but has limited motion and strength.  The patient reports that he is also noted that his information processing speed appears slower than before his stroke but has been improving.  The patient denies any significant depression or anxiety based symptoms and remains motivated to continue with progress in therapies and has also motivated to continue with outpatient PT/OT once he is discharged.  The patient has family support and appears to be coping and managing fairly well given the situation.   Disposition/Plan:  The patient is to be discharged on Friday and plans to continue therapeutic interventions in outpatient rehabilitation setting.  The patient denies any significant depression or anxiety symptoms and appears to be coping fairly well at this point.  Diagnosis:    Subcortical infarction [facility]) - Plan: Ambulatory referral to Neurology, Ambulatory referral to Physical Medicine Rehab         Electronically Signed   _______________________ Arley Phenix, Psy.D. Clinical Neuropsychologist

## 2021-03-12 NOTE — Progress Notes (Signed)
Patient ID: Charles Callahan, male   DOB: [birth year], 50 y.o.   MRN: [id number]   Family education scheduled Friday, Mar 14, 2021 with pt daughter [name]. [address] [place] [phone]

## 2021-03-12 NOTE — Progress Notes (Signed)
Occupational Therapy Session Note  Patient Details  Name: Charles Callahan MRN: [id number] Date of Birth: 1971-01-05  Today's Date: 03/12/2021 OT Individual Time: 6269-4854 OT Individual Time Calculation (min): 45 min (missed 15 mins as pt not in room)    Short Term Goals: Week 1:  OT Short Term Goal 1 (Week 1): Patient will complete LB dressing/bathing using AE PRN with close spvsn. OT Short Term Goal 2 (Week 1): Patient will use RUE in BADL tasks at fine motor assist level with min vc's. OT Short Term Goal 3 (Week 1): Patient will complete 3/3 toileting tasks with close spvsn. OT Short Term Goal 4 (Week 1): Patient will complete dynamic standing ADLs with close spvsn.  Skilled Therapeutic Interventions/Progress Updates:    Treatment session with focus on dynamic standing balance, functional mobility, RUE NMR, and simple housekeeping tasks.  Pt initially not in room and not on unit upon therapist arrival.  Pt arrived back to room at 0745 propelling w/c independently.  Pt reports that he was aware of therapy scheduled at 0730.  Pt reports already washed up and dressed this AM prior to session, despite plan to engage in shower during session per discussion yesterday.  Pt propelled to therapy gym.  Engaged in RUE NMR in sitting with focus on shoulder flexion/extension, abduction/adduction, and internal/external rotation during bimanual task to facilitate symmetrical movements.  Engaged in sit > stand and reaching outside BOS with RUE to increase shoulder flexion while challenging balance.  Pt retrieved horse shoes from basketball goal and crossed midline to toss a horse shoe target all with supervision and no UE support.  Pt ambulated with RW with supervision to gather tossed horseshoes. Engaged in simulated house keeping tasks by gathering items from floor and then completing simulated laundry task.  Therapist educated on various alternative methods/routines to increase safety with housekeeping tasks as  pt to use RW in home upon d/c.   Therapy Documentation Precautions:  Precautions Precautions: Fall Precaution Comments: R hemi Restrictions Weight Bearing Restrictions: No Pain: Pain Assessment Pain Scale: 0-10 Pain Score: 0-No pain   Therapy/Group: Individual Therapy  Rosalio Loud 03/12/2021, 12:28 PM

## 2021-03-12 NOTE — Progress Notes (Signed)
Physical Therapy Session Note  Patient Details  Name: XXAVIER NOON MRN: [id number] Date of Birth: 27-Feb-1971  Today's Date: 03/12/2021 PT Individual Time: 1638-4665 PT Individual Time Calculation (min): 40 min   Short Term Goals: Week 1:  PT Short Term Goal 1 (Week 1): pt will perform bed mobility with supervision PT Short Term Goal 2 (Week 1): pt will perform bed<>chair transfers with LRAD and CGA PT Short Term Goal 3 (Week 1): pt will ambulate 10ft with LRAD and CGA  Skilled Therapeutic Interventions/Progress Updates:     Patient in w/c in the hall on [facility] upon PT arrival. Patient alert and agreeable to PT session. Patient denied pain during session.  Therapeutic Activity: Transfers: Patient performed sit to/from stand x4 with supervision without AD. Provided verbal cues for forward weight shift.  Gait Training:  Patient ambulated >200 feet x2 and >100 feet x1 with CGA without AD. Ambulated with decreased gait speed, step length, and step height, decreased trunk rotation and arm swing, decreased R weight shift with reduced L swing phase, and reduced gastroc activation for forward propulsion. Provided verbal cues for increased arm swing for improved balance and gait speed, increased step length to promote forward propulsion, and external cue from metronome set to 85 bpm for equal stance time.  Neuromuscular Re-education:  Patient performed the following activities for dynamic balance with gait and improved lower extremity motor control: -backwards walking 50 ft focused on increased step length and gluteal/hamstring activation -side stepping R/L 50 feet focused on lateral hip activation in stance and swing with increased lateral step length -grapevine R/L along 18 foot rail, then 40 feet without rail each direction focused on sequencing, weight shift to crossed foot (in front or behind), and lateral step length for increased lateral hip activation  Patient in w/c in the room at end of  session with breaks locked and all needs within reach.    Therapy Documentation Precautions:  Precautions Precautions: Fall Precaution Comments: R hemi Restrictions Weight Bearing Restrictions: No   Therapy/Group: Individual Therapy  Steward Sames L Annalynne Ibanez PT, DPT  03/12/2021, 9:09 PM

## 2021-03-12 NOTE — Discharge Instructions (Signed)
Inpatient Rehab Discharge Instructions  Charles Callahan Discharge date and time: No discharge date for patient encounter.   Activities/Precautions/ Functional Status: Activity: activity as tolerated Diet: regular diet Wound Care: Routine skin checks Functional status:  ___ No restrictions     ___ Walk up steps independently ___ 24/7 supervision/assistance   ___ Walk up steps with assistance ___ Intermittent supervision/assistance  ___ Bathe/dress independently ___ Walk with walker     _x__ Bathe/dress with assistance ___ Walk Independently    ___ Shower independently ___ Walk with assistance    ___ Shower with assistance ___ No alcohol     ___ Return to work/school ________  COMMUNITY REFERRALS UPON DISCHARGE:    Outpatient: PT     OT    ST                 Agency: [facility] Phone: [phone]              Appointment Date/Time: TBD  Medical Equipment/Items Ordered: Best boy                                                 Agency/Supplier: [employer]   Special Instructions: No driving smoking or alcohol  Continue aspirin 325 mg daily and Plavix 75 mg daily x3 months then aspirin alone STROKE/TIA DISCHARGE INSTRUCTIONS SMOKING Cigarette smoking nearly doubles your risk of having a stroke & is the single most alterable risk factor  If you smoke or have smoked in the last 12 months, you are advised to quit smoking for your health.  Most of the excess cardiovascular risk related to smoking disappears within a year of stopping.  Ask you doctor about anti-smoking medications  [place] Quit Line: 1-800-QUIT NOW  Free Smoking Cessation Classes [phone]  CHOLESTEROL Know your levels; limit fat & cholesterol in your diet  Lipid Panel     Component Value Date/Time   CHOL 143 03/01/2021 0252   TRIG 46 03/01/2021 0252   HDL 32 (L) 03/01/2021 0252   CHOLHDL 4.5 03/01/2021 0252   VLDL 9 03/01/2021 0252   LDLCALC 102 (H) 03/01/2021 0252       Many patients benefit from treatment even if their cholesterol is at goal.  Goal: Total Cholesterol (CHOL) less than 160  Goal:  Triglycerides (TRIG) less than 150  Goal:  HDL greater than 40  Goal:  LDL (LDLCALC) less than 100   BLOOD PRESSURE American Stroke Association blood pressure target is less that 120/80 mm/Hg  Your discharge blood pressure is:  BP: (!) 141/81  Monitor your blood pressure  Limit your salt and alcohol intake  Many individuals will require more than one medication for high blood pressure  DIABETES (A1c is a blood sugar average for last 3 months) Goal HGBA1c is under 7% (HBGA1c is blood sugar average for last 3 months)  Diabetes: No known diagnosis of diabetes    Lab Results  Component Value Date   HGBA1C 5.9 (H) 02/28/2021     Your HGBA1c can be lowered with medications, healthy diet, and exercise.  Check your blood sugar as directed by your physician  Call your physician if you experience unexplained or low blood sugars.  PHYSICAL ACTIVITY/REHABILITATION Goal is 30 minutes at least 4 days per week  Activity: Increase activity slowly, Therapies: Physical Therapy: Home Health  Return to work:   Activity decreases your risk of heart attack and stroke and makes your heart stronger.  It helps control your weight and blood pressure; helps you relax and can improve your mood.  Participate in a regular exercise program.  Talk with your doctor about the best form of exercise for you (dancing, walking, swimming, cycling).  DIET/WEIGHT Goal is to maintain a healthy weight  Your discharge diet is:  Diet Order            Diet Heart Room service appropriate? Yes; Fluid consistency: Thin  Diet effective now                 liquids Your height is:    Your current weight is:   Your Body Mass Index (BMI) is:     Following the type of diet specifically designed for you will help prevent another stroke.  Your goal weight range is:    Your goal Body Mass  Index (BMI) is 19-24.  Healthy food habits can help reduce 3 risk factors for stroke:  High cholesterol, hypertension, and excess weight.  RESOURCES Stroke/Support Group:  Call [phone]   STROKE EDUCATION PROVIDED/REVIEWED AND GIVEN TO PATIENT Stroke warning signs and symptoms How to activate emergency medical system (call 911). Medications prescribed at discharge. Need for follow-up after discharge. Personal risk factors for stroke. Pneumonia vaccine given:  Flu vaccine given:  My questions have been answered, the writing is legible, and I understand these instructions.  I will adhere to these goals & educational materials that have been provided to me after my discharge from the hospital.      My questions have been answered and I understand these instructions. I will adhere to these goals and the provided educational materials after my discharge from the hospital.  Patient/Caregiver Signature _______________________________ Date __________  Clinician Signature _______________________________________ Date __________  Please bring this form and your medication list with you to all your follow-up doctor's appointments.

## 2021-03-12 NOTE — Plan of Care (Signed)
  Problem: Consults Goal: RH STROKE PATIENT EDUCATION Description: See Patient Education module for education specifics  Outcome: Progressing   Problem: RH BOWEL ELIMINATION Goal: RH STG MANAGE BOWEL WITH ASSISTANCE Description: STG Manage Bowel with Mod I Assistance. Outcome: Progressing   Problem: RH SKIN INTEGRITY Goal: RH STG ABLE TO PERFORM INCISION/WOUND CARE W/ASSISTANCE Description: STG Able To Perform Incision/Wound Care With Mod I Assistance. Outcome: Progressing   Problem: RH SAFETY Goal: RH STG ADHERE TO SAFETY PRECAUTIONS W/ASSISTANCE/DEVICE Description: STG Adhere to Safety Precautions With Mod I Assistance/Device. Outcome: Progressing Goal: RH STG DECREASED RISK OF FALL WITH ASSISTANCE Description: STG Decreased Risk of Fall With cues and reminders. Outcome: Progressing   Problem: RH PAIN MANAGEMENT Goal: RH STG PAIN MANAGED AT OR BELOW PT'S PAIN GOAL Description: < 3 on a 0-10 pain scale. Outcome: Progressing   Problem: RH KNOWLEDGE DEFICIT Goal: RH STG INCREASE KNOWLEDGE OF HYPERTENSION Description: Patient will demonstrate knowledge of medication management, HTN medications, dietary restrictions, BP parameters with educational materials and handouts provided by staff, at discharge independently. Outcome: Progressing Goal: RH STG INCREASE KNOWLEGDE OF HYPERLIPIDEMIA Description: Patient will demonstrate knowledge of HLD medications, lab values, dietary plans with educational materials and handouts provided by staff, at discharge independently. Outcome: Progressing Goal: RH STG INCREASE KNOWLEDGE OF STROKE PROPHYLAXIS Description: Patient will demonstrate knowledge of medications used to prevent future strokes with educational materials and handouts provided by staff, independently at discharge. Outcome: Progressing

## 2021-03-13 ENCOUNTER — Other Ambulatory Visit (HOSPITAL_COMMUNITY): Payer: Self-pay

## 2021-03-13 MED ORDER — VITAMIN D3 25 MCG PO TABS
1000.0000 [IU] | ORAL_TABLET | Freq: Every day | ORAL | 0 refills | Status: DC
  Filled 2021-03-13: qty 30, 30d supply, fill #0

## 2021-03-13 MED ORDER — OMEPRAZOLE 20 MG PO CPDR
20.0000 mg | DELAYED_RELEASE_CAPSULE | Freq: Every day | ORAL | 0 refills | Status: DC
  Filled 2021-03-13: qty 30, 30d supply, fill #0

## 2021-03-13 MED ORDER — AMLODIPINE BESYLATE 10 MG PO TABS
10.0000 mg | ORAL_TABLET | Freq: Every day | ORAL | 0 refills | Status: DC
  Filled 2021-03-13: qty 30, 30d supply, fill #0

## 2021-03-13 MED ORDER — ATORVASTATIN CALCIUM 80 MG PO TABS
80.0000 mg | ORAL_TABLET | Freq: Every day | ORAL | 0 refills | Status: DC
  Filled 2021-03-13: qty 30, 30d supply, fill #0

## 2021-03-13 MED ORDER — ASPIRIN 325 MG PO TBEC: 325.0000 mg | DELAYED_RELEASE_TABLET | Freq: Every day | ORAL | 0 refills | Status: DC

## 2021-03-13 MED ORDER — CLOPIDOGREL BISULFATE 75 MG PO TABS
75.0000 mg | ORAL_TABLET | Freq: Every day | ORAL | 1 refills | Status: DC
  Filled 2021-03-13: qty 30, 30d supply, fill #0

## 2021-03-13 MED ORDER — NICOTINE 14 MG/24HR TD PT24
MEDICATED_PATCH | TRANSDERMAL | 0 refills | Status: DC
  Filled 2021-03-13: qty 28, fill #0

## 2021-03-13 MED ORDER — LIDOCAINE 5 % EX PTCH
1.0000 | MEDICATED_PATCH | CUTANEOUS | 0 refills | Status: DC
  Filled 2021-03-13: qty 30, 30d supply, fill #0

## 2021-03-13 MED ORDER — LISINOPRIL 20 MG PO TABS
20.0000 mg | ORAL_TABLET | Freq: Every day | ORAL | 0 refills | Status: DC
  Filled 2021-03-13: qty 30, 30d supply, fill #0

## 2021-03-13 MED ORDER — ACETAMINOPHEN 325 MG PO TABS: 650.0000 mg | ORAL_TABLET | ORAL | Status: DC | PRN

## 2021-03-13 MED ORDER — ALBUTEROL SULFATE HFA 108 (90 BASE) MCG/ACT IN AERS
1.0000 | INHALATION_SPRAY | Freq: Four times a day (QID) | RESPIRATORY_TRACT | 0 refills | Status: DC | PRN
  Filled 2021-03-13: qty 18, 25d supply, fill #0

## 2021-03-13 MED ORDER — PANTOPRAZOLE SODIUM 40 MG PO TBEC
40.0000 mg | DELAYED_RELEASE_TABLET | Freq: Every day | ORAL | 0 refills | Status: DC
  Filled 2021-03-13: qty 30, 30d supply, fill #0

## 2021-03-13 NOTE — Progress Notes (Signed)
Occupational Therapy Discharge Summary  Patient Details  Name: Charles Callahan MRN: [id number] Date of Birth: 03-22-1971  Patient has met 61 of 11 long term goals due to improved activity tolerance, improved balance, postural control, ability to compensate for deficits, functional use of  RIGHT upper and RIGHT lower extremity and improved coordination.  Patient to discharge at overall Modified Independent level.  Patient's care partner is independent to provide the necessary physical assistance at discharge for higher level IADL tasks.    Reasons goals not met: n/a  Recommendation:  Patient will benefit from ongoing skilled OT services in outpatient setting to continue to advance functional skills in the area of BADL and functional use of  R UE.  Equipment: wide based quad cane  Reasons for discharge: treatment goals met and discharge from hospital  Patient/family agrees with progress made and goals achieved: Yes  OT Discharge Precautions/Restrictions  Precautions Precautions: Fall Pain Pain Assessment Pain Scale: Faces Faces Pain Scale: No hurt ADL ADL Equipment Provided: Other (comment) (Shower chair) Grooming: Modified independent Where Assessed-Grooming: Sitting at sink,Standing at sink Upper Body Bathing: Modified independent Where Assessed-Upper Body Bathing: Shower Lower Body Bathing: Modified independent Where Assessed-Lower Body Bathing: Shower Upper Body Dressing: Modified independent (Device) Where Assessed-Upper Body Dressing: Other (Comment) (On shower bench) Lower Body Dressing: Modified independent Where Assessed-Lower Body Dressing: Other (Comment) (On shower bench) Toileting: Modified independent Where Assessed-Toileting: Toilet (High raised toilet seat) Toilet Transfer: Modified independent Armed forces technical officer Method: Arts development officer: Raised toilet seat Tub/Shower Transfer: Modified independent Clinical cytogeneticist Method: Programmer, applications: Facilities manager: Modified independent Social research officer, government Method: Radiographer, therapeutic: Shower seat with back ADL Comments: Pt had completed ADLs prior to OT eval session (was washed up, dressed, groomed) and requested shower tomorrow. Will inform OT for next session to complete ADL assessment/CareTool. Cognition Overall Cognitive Status: Within Functional Limits for tasks assessed Arousal/Alertness: Awake/alert Orientation Level: Oriented X4 Memory: Appears intact Awareness: Appears intact Problem Solving: Appears intact Problem Solving Impairment: Functional basic;Verbal basic Safety/Judgment: Appears intact Sensation Sensation Light Touch: Impaired by gross assessment Proprioception: Appears Intact Coordination Gross Motor Movements are Fluid and Coordinated: No Fine Motor Movements are Fluid and Coordinated: No Coordination and Movement Description: mild R hemi, RLE weakness, generalized weakness; improved significantly since eval Finger Nose Finger Test: mild dysmetria on RUE Heel Shin Test: slower on RLE Motor  Motor Motor: Hemiplegia Motor - Skilled Clinical Observations: mild R hemi, RLE weakness, generalized weakness; improved significantly since eval Mobility  Bed Mobility Supine to Sit: Independent with assistive device Sit to Supine: Independent with assistive device Transfers Sit to Stand: Independent with assistive device Stand to Sit: Independent with assistive device  Balance Static Sitting Balance Static Sitting - Balance Support: Feet supported;No upper extremity supported Static Sitting - Level of Assistance: 7: Independent Dynamic Sitting Balance Dynamic Sitting - Balance Support: Feet supported;No upper extremity supported Dynamic Sitting - Level of Assistance: 7: Independent Static Standing Balance Static Standing - Balance Support: During functional activity Static Standing -  Level of Assistance: 6: Modified independent (Device/Increase time) Dynamic Standing Balance Dynamic Standing - Balance Support: During functional activity Dynamic Standing - Level of Assistance: 6: Modified independent (Device/Increase time) Extremity/Trunk Assessment RUE Assessment RUE Assessment: Exceptions to [facility] Passive Range of Motion (PROM) Comments: Abduction to 90 degrees Active Range of Motion (AROM) Comments: Shoulder FLX 70 degrees, horizontal abduction 60 degrees, elbow, fforearm and wrist WFL RUE Body System:  Neuro Brunstrum levels for arm and hand: Arm;Hand Brunstrum level for arm: Stage V Relative Independence from Synergy Brunstrum level for hand: Stage VI Isolated joint movements LUE Assessment LUE Assessment: Within Functional Limits   Daneen Schick  03/13/2021, 2:36 PM

## 2021-03-13 NOTE — Progress Notes (Signed)
Speech Language Pathology Discharge Summary  Patient Details  Name: Charles Callahan MRN: [id number] Date of Birth: [birth year]  Today's Date: 03/13/2021 SLP Individual Time: 1130-1200 SLP Individual Time Calculation (min): 30 min   Skilled Therapeutic Interventions:  Skilled slp intervention focused on cognition and discharge education. He is able to ambulate in hallways in hospital with wc. Pt was in corridor speaking with family upon st arrival. He recalled therapy at this time and during being 30 minutes.   Pt being dc'd home tomorrow. He was able to verbalize physical limitations and rehab strategies for safe transfers and ambulation in the home.  Pt plans to purchase planner or calender for organization of appointments and tasks and pill organizer for medications.     Patient has met 4 of 4 long term goals.  Patient to discharge at overall Modified Independent level.  Reasons goals not met:     Clinical Impression/Discharge Summary:   Pt has met 4 of 4 long term goals this reportng period due to improvement in awareness, problem solving, memory, and speech intelligibility. He is mod independent with functional complex cognitive tasks. Speech intelligiblity is 95-100% at conversational level. Pt will discharge home with supervision from mother and daughter. No follow slp tx recommended at this time.  Care Partner:  Caregiver Able to Provide Assistance: Yes;No  Type of Caregiver Assistance: Cognitive;Physical  Recommendation:  None      Equipment: none recommended at this time   Reasons for discharge: Treatment goals met   Patient/Family Agrees with Progress Made and Goals Achieved: Yes    Charles Callahan 03/13/2021, 11:47 AM

## 2021-03-13 NOTE — Plan of Care (Signed)
  Problem: RH Balance Goal: LTG Patient will maintain dynamic standing with ADLs (OT) Description: LTG:  Patient will maintain dynamic standing balance with assist during activities of daily living (OT)  Outcome: Completed/Met   Problem: Sit to Stand Goal: LTG:  Patient will perform sit to stand in prep for activites of daily living with assistance level (OT) Description: LTG:  Patient will perform sit to stand in prep for activites of daily living with assistance level (OT) Outcome: Completed/Met   Problem: RH Grooming Goal: LTG Patient will perform grooming w/assist,cues/equip (OT) Description: LTG: Patient will perform grooming with assist, with/without cues using equipment (OT) Outcome: Completed/Met   Problem: RH Bathing Goal: LTG Patient will bathe all body parts with assist levels (OT) Description: LTG: Patient will bathe all body parts with assist levels (OT) Outcome: Completed/Met   Problem: RH Dressing Goal: LTG Patient will perform upper body dressing (OT) Description: LTG Patient will perform upper body dressing with assist, with/without cues (OT). Outcome: Completed/Met Goal: LTG Patient will perform lower body dressing w/assist (OT) Description: LTG: Patient will perform lower body dressing with assist, with/without cues in positioning using equipment (OT) Outcome: Completed/Met   Problem: RH Toileting Goal: LTG Patient will perform toileting task (3/3 steps) with assistance level (OT) Description: LTG: Patient will perform toileting task (3/3 steps) with assistance level (OT)  Outcome: Completed/Met   Problem: RH Functional Use of Upper Extremity Goal: LTG Patient will use RT/LT upper extremity as a (OT) Description: LTG: Patient will use right/left upper extremity as a stabilizer/gross assist/diminished/nondominant/dominant level with assist, with/without cues during functional activity (OT) Outcome: Completed/Met   Problem: RH Light Housekeeping Goal: LTG  Patient will perform light housekeeping w/assist (OT) Description: LTG: Patient will perform light housekeeping with assistance, with/without cues (OT). Outcome: Completed/Met   Problem: RH Toilet Transfers Goal: LTG Patient will perform toilet transfers w/assist (OT) Description: LTG: Patient will perform toilet transfers with assist, with/without cues using equipment (OT) Outcome: Completed/Met   Problem: RH Tub/Shower Transfers Goal: LTG Patient will perform tub/shower transfers w/assist (OT) Description: LTG: Patient will perform tub/shower transfers with assist, with/without cues using equipment (OT) Outcome: Completed/Met

## 2021-03-13 NOTE — Progress Notes (Signed)
Occupational Therapy Session Note  Patient Details  Name: Charles Callahan MRN: [id number] Date of Birth: 01/05/1971  Today's Date: 03/13/2021  OT Individual Time: 5465-0354 OT Individual Time Calculation (min): 55 min   OT Individual Time: 1406-1450 OT Individual Time Calculation (min): 44 min    Short Term Goals: Week 1:  OT Short Term Goal 1 (Week 1): Patient will complete LB dressing/bathing using AE PRN with close spvsn. OT Short Term Goal 2 (Week 1): Patient will use RUE in BADL tasks at fine motor assist level with min vc's. OT Short Term Goal 3 (Week 1): Patient will complete 3/3 toileting tasks with close spvsn. OT Short Term Goal 4 (Week 1): Patient will complete dynamic standing ADLs with close spvsn.  Skilled Therapeutic Interventions/Progress Updates:   Session 1: Met pt in shower. Treatment session was focused on safety while completing daily routine tasks. Pt completed sit > stand transfers while in shower with Mod I. Pt requires shower bench and grab bars while transferring in shower. Pt was Mod I for all self-care tasks. Pt was Mod I for stepping over ledge from shower and stand pivot to w/c. Pt was able to self propel with w/c to rehab gym. Pt used quad cane to maneuver through apartment to complete daily routine. Pt was Mod I for lying in bed to standing using his quad cane. OTS asked pt to consider his cane placement when transitioning from bed to standing. Pt was able to problem-solve and move the can to a more appropriate location. In the kitchen, pt stabilized himself on the counter while simulating home management tasks. Pt was Mod I for ambulating through entire apartment using quad cane. Pt was able to step over tub ledge for tub/shower transfer. OT and OTS educated pt on safer transfers into tub/shower. OT suggested pt get a detachable grab bar for the front of his shower to ensure safety while stepping over a high ledge. Pt still has RUE deficits/weakness while completing  ROM and MMT. Pt reported his R shoulder has always been weaker than his L.Pt will continue to benefit from education on safety while using cane. Left pt sitting in w/c in his room with all necessary needs within reach.  Session 2: Met pt sitting in w/c in room, pt agreed to treatment session. Treatment session was focused on increasing shoulder ROM during functional activities. Pt was able to self-propel himself throughout halls. Pt was Mod I for sit > stand tx using quad cane. Pt was able to use R shoulder flexion and extension to complete Wii bowling while standing with cane. OTS reminded pt to not push his pain to complete the game. OTS suggested pt change his position to ease pain while completing shoulder flexion/extension during the game. Pt tolerated standing using quad cane throughout game. OTS provided handouts on energy conservation to limit pain in shoulder once d/c. Left pt sitting in w/c in room, all necessary needs within reach.   Therapy Documentation Precautions:  Precautions Precautions: Fall Precaution Comments: mild R hemi Restrictions Weight Bearing Restrictions: No Pain: Pain Assessment Pain Scale: Faces Faces Pain Scale: No hurt ADL: ADL Equipment Provided: Other (comment) (Shower chair) Grooming: Modified independent Where Assessed-Grooming: Sitting at sink,Standing at sink Upper Body Bathing: Modified independent Where Assessed-Upper Body Bathing: Shower Lower Body Bathing: Modified independent Where Assessed-Lower Body Bathing: Shower Upper Body Dressing: Modified independent (Device) Where Assessed-Upper Body Dressing: Other (Comment) (On shower bench) Lower Body Dressing: Modified independent Where Assessed-Lower Body Dressing: Other (Comment) (  On shower bench) Toileting: Modified independent Where Assessed-Toileting: Toilet (High raised toilet seat) Toilet Transfer: Modified independent Armed forces technical officer Method: Arts development officer: Raised  toilet seat Tub/Shower Transfer: Modified independent Clinical cytogeneticist Method: Librarian, academic: Facilities manager: Modified independent Social research officer, government Method: Radiographer, therapeutic: Shower seat with back ADL Comments: Pt had completed ADLs prior to OT eval session (was washed up, dressed, groomed) and requested shower tomorrow. Will inform OT for next session to complete ADL assessment/CareTool.   Therapy/Group: Individual Therapy    03/13/2021, 3:00 PM

## 2021-03-13 NOTE — Progress Notes (Signed)
Physical Therapy Discharge Summary  Patient Details  Name: Charles Callahan MRN: [id number] Date of Birth: 1970/11/25  Today's Date: 03/13/2021 PT Individual Time: 0900-0938 PT Individual Time Calculation (min): 38 min   Patient has met 8 of 8 long term goals due to improved activity tolerance, improved balance, improved postural control, increased strength, decreased pain, improved attention, improved awareness and improved coordination. Patient to discharge at an ambulatory level Modified Independent.  Pt demonstrates good safety awareness and understanding of deficits and has verbalized and demonstrated confidence with all tasks to ensure safe discharge home. Pt with no further questions regarding mobility.   All goals met   Recommendation:  Patient will benefit from ongoing skilled PT services in outpatient setting to continue to advance safe functional mobility, address ongoing impairments in generalized strengthening, dynamic standing balance/coordination, gait training, NMR, endurance, and to minimize fall risk.  Equipment: quad cane  Reasons for discharge: treatment goals met  Patient/family agrees with progress made and goals achieved: Yes  Today's Interventions Received pt sitting in WC, pt agreeable to therapy, and denied any pain during session. Session with emphasis on discharge planning, functional mobility/transfers, generalized strengthening, dynamic standing balance/coordination, simulated car transfers, ambulation, and improved activity tolerance. Pt ambulated 133f x 2 and 764fx 2 mod I with quad cane to/from 46M ortho gym. Pt performed ambulatory simulated car transfer with quad cane and supervision; entering via side step method. Educated pt on sitting first then getting LEs in for safety purposes. Pt ambulated 1065fn uneven surfaces (ramp and mulch) with quad cane and supervision and descended 1 6in curb with quad cane and supervision with cues to step down with RLE. Pt able  to pick up small clothespin from floor using RUE and CGA/close supervision with no LOB noted. Pt performed BLE strengthening on Nustep at workload 8 decreasing to workload 7 for 8 minutes for a total of 377 steps for improved cardiovascular endurance and reciprocal movement training. Concluded session with [facility]ing in [facility] needs [facility] reach. Pt made mod I in room, NT made aware.   PT Discharge Precautions/Restrictions Precautions Precautions: Fall Precaution Comments: mild R hemi Restrictions Weight Bearing Restrictions: No  Cognition Overall Cognitive Status: Within Functional Limits for tasks assessed Arousal/Alertness: Awake/alert Orientation Level: Oriented X4 Memory: Appears intact Awareness: Appears intact Problem Solving: Appears intact Safety/Judgment: Appears intact Sensation Sensation Light Touch: Impaired by gross assessment Proprioception: Appears Intact Additional Comments: altered sensation along L2 dermatome Coordination Gross Motor Movements are Fluid and Coordinated: No Fine Motor Movements are Fluid and Coordinated: No Coordination and Movement Description: mild R hemi, RLE weakness, generalized weakness; improved significantly since eval Finger Nose Finger Test: mild dysmetria on RUE Heel Shin Test: slower on RLE Motor  Motor Motor: Hemiplegia Motor - Skilled Clinical Observations: mild R hemi, RLE weakness, generalized weakness; improved significantly since eval  Mobility Bed Mobility Bed Mobility: Rolling Right;Rolling Left;Sit to Supine;Supine to Sit Rolling Right: Independent with assistive device Rolling Left: Independent with assistive device Supine to Sit: Independent with assistive device Sit to Supine: Independent with assistive device Transfers Transfers: Sit to Stand;Stand to Sit;Stand Pivot Transfers Sit to Stand: Independent with assistive device Stand to Sit: Independent with assistive device Stand Pivot Transfers: Independent with  assistive device Transfer (Assistive device): Large base quad cane Locomotion  Gait Ambulation: Yes Gait Assistance: Independent with assistive device Gait Distance (Feet): 150 Feet Assistive device: Large base quad cane Gait Gait: Yes Gait Pattern: Impaired Gait Pattern: Step-through pattern;Decreased step  length - right;Decreased dorsiflexion - right;Poor foot clearance - right Gait velocity: decreased Stairs / Additional Locomotion Stairs: Yes Stairs Assistance: Supervision/Verbal cueing Stair Management Technique: One rail Left Number of Stairs: 52 Height of Stairs: 6 Ramp: Independent with assistive device (quad cane) Wheelchair Mobility Wheelchair Mobility: Yes Wheelchair Assistance: Independent with Camera operator: Both lower extermities Wheelchair Parts Management: Independent Distance: >168f  Trunk/Postural Assessment  Cervical Assessment Cervical Assessment: Within Functional Limits Thoracic Assessment Thoracic Assessment: Within Functional Limits Lumbar Assessment Lumbar Assessment: Within Functional Limits Postural Control Postural Control: Deficits on evaluation  Balance Balance Balance Assessed: Yes Static Sitting Balance Static Sitting - Balance Support: Feet supported;No upper extremity supported Static Sitting - Level of Assistance: 7: Independent Dynamic Sitting Balance Dynamic Sitting - Balance Support: Feet supported;No upper extremity supported Dynamic Sitting - Level of Assistance: 7: Independent Static Standing Balance Static Standing - Balance Support: Left upper extremity supported (quad cane) Static Standing - Level of Assistance: 6: Modified independent (Device/Increase time) Dynamic Standing Balance Dynamic Standing - Balance Support: Left upper extremity supported (quad cane) Dynamic Standing - Level of Assistance: 6: Modified independent (Device/Increase time) Dynamic Standing - Comments: during ambulation and  transfers Extremity Assessment  RLE Assessment RLE Assessment: Exceptions to [facility] Strength Right Hip Flexion: 4-/5 Right Hip ABduction: 3+/5 Right Hip ADduction: 3+/5 Right Knee Flexion: 4-/5 Right Knee Extension: 4-/5 Right Ankle Dorsiflexion: 4-/5 Right Ankle Plantar Flexion: 4-/5 LLE Assessment LLE Assessment: Exceptions to [facility] ServicesGeneral Strength Comments: grossly generalized to 4/5  AAlfonse AlpersPT, DPT  03/13/2021, 7:33 AM

## 2021-03-13 NOTE — Progress Notes (Signed)
PROGRESS NOTE   Subjective/Complaints: Charles Callahan has no complaints this morning Patient's chart reviewed- No issues reported overnight BP elevated but bradycardia Pleased with his progress  ROS: Denies CP, SOB, N/V/D.  Objective:   No results found. No results for input(s): WBC, HGB, HCT, PLT in the last 72 hours. Recent Labs    03/10/21 1105 03/12/21 0533  NA 132*  --   K 4.3  --   CL 102  --   CO2 26  --   GLUCOSE 103*  --   BUN 16  --   CREATININE 0.88 0.97  CALCIUM 12.0*  --     Intake/Output Summary (Last 24 hours) at 03/13/2021 1049 Last data filed at 03/13/2021 1048 Gross per 24 hour  Intake 680 ml  Output 700 ml  Net -20 ml        Physical Exam: Vital Signs Blood pressure (!) 159/80, pulse (!) 52, temperature (!) 97.5 F (36.4 C), temperature source Oral, resp. rate 18, weight 100.8 kg, SpO2 100 %. Gen: no distress, normal appearing HEENT: oral mucosa pink and moist, NCAT Cardio: Reg rate Chest: normal effort, normal rate of breathing GI: Non-distended.  BS +. Skin: Warm and dry.  Intact. Psych: Normal mood.  Normal behavior. Musc: No edema in extremities.  No tenderness in extremities. Neuro: Alert Makes eye contact with examiner.   Follows commands.   Fair insight and awareness. Motor: LUE/LE: 5/5 proximal distal RUE: Shoulder abduction 4-/5  RLE: 3-/5 HF, KE, 3+/5 ADF, unchanged  Assessment/Plan: 1. Functional deficits which require 3+ hours per day of interdisciplinary therapy in a comprehensive inpatient rehab setting.  Physiatrist is providing close team supervision and 24 hour management of active medical problems listed below.  Physiatrist and rehab team continue to assess barriers to discharge/monitor patient progress toward functional and medical goals  Care Tool:  Bathing    Body parts bathed by patient: Right arm,Right lower leg,Left arm,Left lower leg,Chest,Abdomen,Front  perineal area,Buttocks,Right upper leg,Left upper leg,Face   Body parts bathed by helper: Right lower leg     Bathing assist Assist Level: Independent with assistive device     Upper Body Dressing/Undressing Upper body dressing   What is the patient wearing?: Pull over shirt    Upper body assist Assist Level: Independent with assistive device    Lower Body Dressing/Undressing Lower body dressing      What is the patient wearing?: Underwear/pull up,Pants     Lower body assist Assist for lower body dressing: Independent with assitive device     Toileting Toileting    Toileting assist Assist for toileting: Independent with assistive device     Transfers Chair/bed transfer  Transfers assist     Chair/bed transfer assist level: Independent with assistive device Chair/bed transfer assistive device:  (quad cane)   Locomotion Ambulation   Ambulation assist      Assist level: Independent with assistive device Assistive device: Cane-quad Max distance: 137ft   Walk 10 feet activity   Assist     Assist level: Independent with assistive device Assistive device: Cane-quad   Walk 50 feet activity   Assist    Assist level: Independent with assistive device  Assistive device: Cane-quad    Walk 150 feet activity   Assist Walk 150 feet activity did not occur: Safety/medical concerns (fatigue, R hemi, decreased balance, weakness)  Assist level: Independent with assistive device Assistive device: Cane-quad    Walk 10 feet on uneven surface  activity   Assist     Assist level: Supervision/Verbal cueing Assistive device: Cane-quad   Wheelchair     Assist Will patient use wheelchair at discharge?: No Type of Wheelchair: Manual    Wheelchair assist level: Independent Max wheelchair distance: >334ft    Wheelchair 50 feet with 2 turns activity    Assist        Assist Level: Independent   Wheelchair 150 feet activity     Assist       Assist Level: Independent   Blood pressure (!) 159/80, pulse (!) 52, temperature (!) 97.5 F (36.4 C), temperature source Oral, resp. rate 18, weight 100.8 kg, SpO2 100 %.  Medical Problem List and Plan: 1.  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  Continue CIR 2.  Impaired mobility: d/c Lovenox since ambulating 400 feet             -antiplatelet therapy: Aspirin 325 mg daily and Plavix 75 mg daily x3 months then aspirin alone 3. Right shoulder pain: Continue lidoderm patch   Controlled on 5/26 4. Mood: Provide emotional support             -antipsychotic agents: N/A 5. Neuropsych: This patient is capable of making decisions on her own behalf. 6. Skin/Wound Care: Routine skin checks 7. Fluids/Electrolytes/Nutrition: Routine in and outs.  8.  Hypertension.  Continue Norvasc 10 mg daily  Increased lisinopril to 20mg  on 5/25 , monitor for effects of this change.   Monitor with increased mobility 9.  Hyperlipidemia: Lipitor 10.  History of diastolic congestive heart failure.               Monitor for signs and symptoms of fluid overload 11.  Tobacco abuse.  Continue NicoDerm patch.  Provide counseling 12.  Thyroid nodule.  Incidental finding.  Outpatient percutaneous sampling. 13. Nausea  Zofran prn started on 5/20  Improved 14.  Hyponatremia  Sodium 132 on 5/23 15.  Hypoalbuminemia  D/c Prosource since eating. Encourage high protein foods.   LOS: 8 days A FACE TO FACE EVALUATION WAS PERFORMED  6/23 [name] 03/13/2021, 10:49 AM

## 2021-03-13 NOTE — Progress Notes (Signed)
Patient ID: Charles Callahan, male   DOB: [birth year], 50 y.o.   MRN: [id number]   Pt MATCH entered into system  [id number] Effective: 5/26. Term: 6/2   Lavera Guise, [place] [phone]

## 2021-03-14 ENCOUNTER — Other Ambulatory Visit (HOSPITAL_COMMUNITY): Payer: Self-pay

## 2021-03-14 NOTE — Progress Notes (Signed)
Inpatient Rehabilitation Care Coordinator Discharge Note  The overall goal for the admission was met for:   Discharge location: Yes, Home   Length of Stay: Yes, 9 Days  Discharge activity level: Yes, Modified Independent level  Home/community participation: Yes  Services provided included: MD, RD, PT, OT, SLP, RN, CM, TR, Pharmacy, Neuropsych and SW  Financial Services: Other: uninsured  Choices offered to/list presented to:pt  Follow-up services arranged: Outpatient: [facility]   Comments (or additional information): PT OT Quad Cane (deliver to pt home)  Patient/Family verbalized understanding of follow-up arrangements: Yes  Individual responsible for coordination of the follow-up plan: Tammy [phone]  Confirmed correct DME delivered: Dyanne Iha 03/14/2021    Dyanne Iha

## 2021-03-14 NOTE — Progress Notes (Signed)
Patient discharged home this morning with daughter present. Information given to patient per PA, no questions noted. Taken down via wheelchair. Vic Ripper

## 2021-03-19 ENCOUNTER — Other Ambulatory Visit (HOSPITAL_COMMUNITY): Payer: Self-pay

## 2021-03-19 ENCOUNTER — Telehealth (HOSPITAL_COMMUNITY): Payer: Self-pay

## 2021-03-19 NOTE — Telephone Encounter (Signed)
Pharmacy Transitions of Care Follow-up Telephone Call  Date of discharge: 03/13/21 Discharge Diagnosis: Subcortical Infarction  How have you been since you were released from the hospital?  Patient doing a little better, trying to walk at home. Doesn't have blood pressure cuff and cannot drive to get one. Asked if patient had anyone who could get one for him and he has a sister. Patient will ask her to bring him one.  Medication changes made at discharge: START taking:  acetaminophen (TYLENOL)   amLODipine (NORVASC)   aspirin   atorvastatin (LIPITOR)   clopidogrel (PLAVIX)   lidocaine (LIDODERM)   nicotine (NICODERM CQ - dosed in mg/24 hours)   pantoprazole (PROTONIX)   Vitamin D3 (Vitamin D)   STOP taking:  dicyclomine 20 MG tablet (BENTYL)   HYDROcodone-homatropine 5-1.5 MG/5ML syrup (HYCODAN)   ibuprofen 600 MG tablet (ADVIL)   omeprazole 20 MG capsule (PRILOSEC)   ondansetron 4 MG tablet (ZOFRAN)   permethrin 5 % cream (ELIMITE)   sucralfate 1 GM/10ML suspension (Carafate)   tobramycin 0.3 % ophthalmic solution (TOBREX)    Medication changes verified by the patient? Yes    Medication Accessibility:  Home Pharmacy: doesn't have one   Was the patient provided with refills on discharged medications? 1 refill for Plavix  Have all prescriptions been transferred from [facility] to home pharmacy?   Is the patient able to afford medications? Applying for Medicaid    Medication Review:  CLOPIDOGREL (PLAVIX) Clopidogrel 75 mg once daily.  - Educated patient on expected duration of therapy with clopidogrel.  - Reviewed potential DDIs with patient  - Advised patient of medications to avoid (NSAIDs, ASA)  - Educated that Tylenol (acetaminophen) will be the preferred analgesic to prevent risk of bleeding  - Emphasized importance of monitoring for signs and symptoms of bleeding (abnormal bruising, prolonged bleeding, nose bleeds, bleeding from gums, discolored urine, black tarry  stools)  - Advised patient to alert all providers of anticoagulation therapy prior to starting a new medication or having a procedure    Follow-up Appointments:  PCP Hospital f/u appt confirmed?  03/24/21 with PCP   Specialist Hospital f/u appt confirmed? Neurology 04/28/21 Dr. Lexine Baton  If their condition worsens, is the pt aware to call PCP or go to the Emergency Dept.? yes  Final Patient Assessment: Patient doing better, has follow up scheduled and is applying for medicaid. Knows he has a refill at [facility].

## 2021-03-25 ENCOUNTER — Other Ambulatory Visit: Payer: Self-pay

## 2021-03-25 ENCOUNTER — Ambulatory Visit: Payer: Medicaid Other | Attending: Physician Assistant

## 2021-03-25 ENCOUNTER — Encounter (HOSPITAL_COMMUNITY): Payer: Self-pay | Admitting: Physical Medicine and Rehabilitation

## 2021-03-25 ENCOUNTER — Ambulatory Visit: Payer: Medicaid Other | Admitting: Occupational Therapy

## 2021-03-25 DIAGNOSIS — R208 Other disturbances of skin sensation: Secondary | ICD-10-CM | POA: Insufficient documentation

## 2021-03-25 DIAGNOSIS — R41841 Cognitive communication deficit: Secondary | ICD-10-CM

## 2021-03-25 DIAGNOSIS — R41840 Attention and concentration deficit: Secondary | ICD-10-CM | POA: Insufficient documentation

## 2021-03-25 DIAGNOSIS — M6281 Muscle weakness (generalized): Secondary | ICD-10-CM | POA: Diagnosis present

## 2021-03-25 DIAGNOSIS — R41842 Visuospatial deficit: Secondary | ICD-10-CM | POA: Insufficient documentation

## 2021-03-25 DIAGNOSIS — R2681 Unsteadiness on feet: Secondary | ICD-10-CM | POA: Insufficient documentation

## 2021-03-25 DIAGNOSIS — R278 Other lack of coordination: Secondary | ICD-10-CM | POA: Diagnosis present

## 2021-03-25 DIAGNOSIS — I69951 Hemiplegia and hemiparesis following unspecified cerebrovascular disease affecting right dominant side: Secondary | ICD-10-CM

## 2021-03-25 DIAGNOSIS — M25511 Pain in right shoulder: Secondary | ICD-10-CM | POA: Diagnosis present

## 2021-03-25 DIAGNOSIS — R471 Dysarthria and anarthria: Secondary | ICD-10-CM | POA: Diagnosis present

## 2021-03-25 NOTE — Therapy (Signed)
[facility] [address] [place], [place], [address] Phone: [phone]   Fax:  [phone]  Speech Language Pathology Evaluation  Patient Details  Name: Charles Callahan MRN: [id number] Date of Birth: 1971/06/16 Referring Provider (SLP): Charlton Amor PA-C   Encounter Date: 03/25/2021   End of Session - 03/25/21 1815    Visit Number 1    Number of Visits 1    SLP Start Time 1315    SLP Stop Time  1400    SLP Time Calculation (min) 45 min    Activity Tolerance Patient tolerated treatment well           Past Medical History:  Diagnosis Date  . CHF (congestive heart failure) (HCC)   . Hypertension     Past Surgical History:  Procedure Laterality Date  . EYE SURGERY      There were no vitals filed for this visit.       SLP Evaluation [facility] - 03/25/21 1316      SLP Visit Information   SLP Received On 03/14/21    Referring Provider (SLP) Charlton Amor PA-C    Onset Date 02-27-21    Medical Diagnosis CVA      Pain Assessment   Currently in Pain? Yes    Pain Score 7     Pain Location Shoulder    Pain Orientation Right    Pain Type Acute pain      Balance Screen   Has the patient fallen in the past 6 months Yes    How many times? 1      Prior Functional Status   Cognitive/Linguistic Baseline Baseline deficits    Baseline deficit details deficits related to 8th grade education    Type of Home House     Lives With Family    Available Support Family    Education 8th grade    Vocation On disability   prep, dishwashing     Cognition   Overall Cognitive Status History of cognitive impairments - at baseline   pt declined need for additional ST intervention at this time     Auditory Comprehension   Overall Auditory Comprehension Appears within functional limits for tasks assessed      Verbal Expression   Overall Verbal Expression Appears within functional limits for tasks assessed      Oral  Motor/Sensory Function   Overall Oral Motor/Sensory Function Appears within functional limits for tasks assessed      Motor Speech   Overall Motor Speech Appears within functional limits for tasks assessed                           SLP Education - 03/25/21 1357    Education Details review of CIR ST, current baseline, memory/attention strategies, future ST as needed    Person(s) Educated Patient;Child(ren)    Methods Explanation;Demonstration;Handout    Comprehension Verbalized understanding;Returned demonstration                Plan - 03/25/21 1815    Clinical Impression Statement Wendle presents for OPST evaluation secondary to CVA in May 2022. Pt declined need for further ST intervention at this time, with pt and daughter reporting return to baseline functioning for cognition, communication, and swallowing. Pt recently participated in ST during CIR, in which pt completed functional mod-complex cognitive tasks with good accuracy and exhibited improved speech intelligibility. No further concerns re: cognition or speech intelligiblity  endorsed during ST evaluation today. Pt denied any difficulty with swallowing. Pt has 8th grade level education, in which baseline deficits reported related to math and processing. Pt reports he is managing medications without difficulty and is currently living with mother due to physical deficits. Some difficulty with time management indicated, which was correlated to motivation according to daughter. Despite reported return to baseline, SLP provided memory and attention compensations to trial at home if needed. SLP recommended requesting additional MD script for ST evaluation if patient and family notice change in cognitive communication as none verbalized at this time. Pt elected not to participate in further ST intervention at this time to focus on PT/OT. Pt and family verbalized understanding with SLP recommendations and denied any further  questions at this time.   Consulted and Agree with Plan of Care Patient;Family member/caregiver           Patient will benefit from skilled therapeutic intervention in order to improve the following deficits and impairments:   Cognitive communication deficit  Dysarthria and anarthria    Problem List Patient Active Problem List   Diagnosis Date Noted  . Nausea without vomiting   . Hypoalbuminemia due to protein-calorie malnutrition (HCC)   . Hyponatremia   . Essential hypertension   . 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 Chronic diastolic congestive heart failure (HCC)   . Dyslipidemia   . Right hemiparesis (HCC)   . Acute CVA (cerebrovascular accident) (HCC) 02/28/2021  . Substance use disorder 02/28/2021  . OBESITY 05/13/2009  . Hypertension 05/13/2009  . STROKE 05/13/2009  . BACK PAIN 05/13/2009  . SHORTNESS OF BREATH 05/13/2009  . CHEST PAIN 05/13/2009    Charles Colonel, MA CCC-SLP 03/25/2021, 8:45 PM  [facility] [address] [place], [place], [address] Phone: [phone]   Fax:  [phone]  Name: Charles Callahan MRN: [id number] Date of Birth: Jun 25, 1971

## 2021-03-25 NOTE — Therapy (Signed)
[facility] [address] [place], [place], [address] Phone: [phone]   Fax:  [phone]  Occupational Therapy Evaluation  Patient Details  Name: Charles Callahan MRN: [id number] Date of Birth: November 01, 1970 Referring Provider (OT): Deatra Ina   Encounter Date: 03/25/2021   OT End of Session - 03/25/21 1519    Visit Number 1    Number of Visits 13    Date for OT Re-Evaluation 05/24/21    Authorization Type medicaid pending    OT Start Time 1230    OT Stop Time 1315    OT Time Calculation (min) 45 min    Activity Tolerance Patient tolerated treatment well    Behavior During Therapy [facility] for tasks assessed/performed           Past Medical History:  Diagnosis Date  . CHF (congestive heart failure) (HCC)   . Hypertension     Past Surgical History:  Procedure Laterality Date  . EYE SURGERY      There were no vitals filed for this visit.   Subjective Assessment - 03/25/21 1239    Subjective  'Get to walking right"  Patient indicates that he has significant shoulder pain.    Currently in Pain? Yes    Pain Score 7     Pain Location Shoulder    Pain Orientation Right    Pain Descriptors / Indicators Aching    Pain Type Acute pain    Pain Onset 1 to 4 weeks ago    Pain Frequency Intermittent    Aggravating Factors  certain movements    Pain Relieving Factors pain patch - lidocaine             [facility] OT Assessment - 03/25/21 0001      Assessment   Medical Diagnosis Subcortical infarcts    Referring Provider (OT) Deatra Ina    Onset Date/Surgical Date 02/26/21    Hand Dominance Right    Prior Therapy CIR therapy      Precautions   Precautions Fall      Balance Screen   Has the patient fallen in the past 6 months Yes    How many times? 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      ADL   Eating/Feeding Minimal assistance    Grooming Minimal assistance    Upper Body Bathing Minimal assistance    Lower Body Bathing Increased time    Upper Body Dressing Increased time    Lower Body Dressing Increased time    Retail buyer - Clothing Manipulation Modified independent    Toileting -  Hygiene Modified Independent   uses left Lexicographer bench    ADL comments Uses soap dish as grab bar      IADL   Prior Level of Function Shopping Independent    Shopping Needs to be accompanied on any shopping trip    Prior Level of Function Light Housekeeping Independent    Light Housekeeping Performs light daily tasks such as dishwashing, bed making    Prior Level of Function Meal Prep Independent    Meal Prep Needs to have meals prepared and served    Prior Level of Automatic Data  Mobility Independent      Written Expression   Dominant Hand Right    Handwriting 100% legible      Vision - History   Baseline Vision Wears glasses only for reading    Visual History --   Patint unaware   Patient Visual Report --   reports needs glasses     Vision Assessment   Eye Alignment Impaired (comment)    Ocular Range of Motion Restricted on looking up    Alignment/Gaze Preference --   right eye more lateral   Tracking/Visual Pursuits Right eye does not track medially    Saccades Additional eye shifts occurred during testing    Convergence Impaired - to be further tested in functional context    Comment Patient reports that he needs to have an eye appt - needs glasses      Cognition   Overall Cognitive Status No family/caregiver present to determine baseline cognitive functioning   Need further assessment - possibly limited insight, attention   Cognition Comments Reports ADHD.  Patient with some difficulty following  instructions,  Patient mildly distracted in busy environment, easily redirected.      Observation/Other Assessments   Focus on Therapeutic Outcomes (FOTO)  49 - 52 Risk adjusted UE   Discharge FS Score - 63     Posture/Postural Control   Posture/Postural Control Postural limitations    Postural Limitations Rounded Shoulders;Forward head;Posterior pelvic tilt   mild - able to self correct     Sensation   Light Touch Impaired by gross assessment    Stereognosis Appears Intact    Proprioception Impaired by gross assessment    Additional Comments Numbness resolving in hands - mild in elbow/forearm      Coordination   Gross Motor Movements are Fluid and Coordinated No    Fine Motor Movements are Fluid and Coordinated No    9 Hole Peg Test Right;Left    Right 9 Hole Peg Test 35.38    Left 9 Hole Peg Test 25.75      Tone   Assessment Location Right Upper Extremity      ROM / Strength   AROM / PROM / Strength AROM;PROM      AROM   Overall AROM  Deficits    Overall AROM Comments 75 shoulder flexion right      PROM   Overall PROM  Deficits    Overall PROM Comments 80 shoulder flexion      Hand Function   Right Hand Gross Grasp Impaired    Right Hand Grip (lbs) 10    Left Hand Gross Grasp Functional    Left Hand Grip (lbs) 70      RUE Tone   RUE Tone Moderate;Hypertonic      RUE Tone   Hypertonic Details elbow flexors                           OT Education - 03/25/21 1519    Education Details potential goals and plan of care for OT    Person(s) Educated Patient;Child(ren)    Methods Explanation    Comprehension Verbalized understanding            OT Short Term Goals - 03/25/21 1527      OT SHORT TERM GOAL #1   Title Patient will complete HEP designed to reduce shoulder pain    Baseline No HEP    Time 4    Period Weeks  Status New    Target Date 04/24/21      OT SHORT TERM GOAL #2   Title Patient will demonstrate awareness of positioning  for comfort and support of RUE in bed    Baseline difficulty sleeping due to pain    Time 4    Period Weeks    Status New      OT SHORT TERM GOAL #3   Title Patient will report pain no greater than 2/10 during ADL    Baseline PAIN 8/10    Time 4    Period Weeks    Status New             OT Long Term Goals - 03/25/21 1529      OT LONG TERM GOAL #1   Title Patient will demonstrate at least 120 degrees of passive shoulder motion (flexion/abduction)    Baseline 80    Time 6    Period Weeks    Status New    Target Date 05/24/21      OT LONG TERM GOAL #2   Title Patient will demonstrate functional use of dominant RUE to feed self at least 50% of meal using utensil    Baseline feeding self with LUE 100%    Time 6    Period Weeks    Status New      OT LONG TERM GOAL #3   Title Patient will complete 9 hole peg test in less than 30 seconds with RUE    Baseline +35 SEC    Time 6    Period Weeks    Status New      OT LONG TERM GOAL #4   Title Patient will demonstrate at least 5 lb increase in grip strength in RUE    Baseline 10    Time 6    Period Weeks    Status New      OT LONG TERM GOAL #5   Title Patient will obtain lightweight object from eye level shelf with active assisted motion of RUE and no increase in pain in right shoulder.    Baseline unable    Time 6    Period Weeks    Status New                 Plan - 03/25/21 1520    Clinical Impression Statement Charles Callahan is a 50 y.o. right-handed male with history of diastolic congestive heart failure as well as hypertension and tobacco use.  Patient lives with parent.  Independent prior to admission.  1 level home with level entry.  Presented 02/27/2021 with acute onset of right side weakness.  Cranial CT scan unremarkable for acute intracranial process.  Area of low density in the cerebral hemisphere white matter consistent with small vessel ischemic change.  Patient did not receive tPA.  MRI showed multiple  subcortical infarcts.  Patient presents to OT eval with right hemiparesis, right shoulder and hip pain, hypertonicity in right UE flexor synergy pattern, binocular visual deficits, milder cognitive impairments - selective attention and insight, decreased balance and activity tolerance all of which impede independent performance of ADL.    OT Occupational Profile and History Detailed Assessment- Review of Records and additional review of physical, cognitive, psychosocial history related to current functional performance    Occupational performance deficits (Please refer to evaluation for details): ADL's;IADL's;Work;Rest and Sleep    Body Structure / Function / Physical Skills ADL;Dexterity;Flexibility;Muscle spasms;ROM;Strength;Vision;Tone;IADL;FMC;Coordination;Balance;Body mechanics;Decreased knowledge of precautions;Endurance;Pain;Sensation;UE functional use;Vestibular;Proprioception;GMC;Decreased knowledge of  use of DME    Cognitive Skills Attention;Memory;Sequencing;Energy/Drive;Problem Solve;Understand    Rehab Potential Good    Clinical Decision Making Several treatment options, min-mod task modification necessary    Comorbidities Affecting Occupational Performance: May have comorbidities impacting occupational performance    Modification or Assistance to Complete Evaluation  Min-Moderate modification of tasks or assist with assess necessary to complete eval    OT Frequency 2x / week    OT Duration 6 weeks    OT Treatment/Interventions Self-care/ADL training;Therapeutic exercise;DME and/or AE instruction;Functional Mobility Training;Cognitive remediation/compensation;Balance training;Visual/perceptual remediation/compensation;Splinting;Manual Therapy;Neuromuscular education;Fluidtherapy;Paraffin;Electrical Stimulation;Aquatic Therapy;Moist Heat;Contrast Bath;Passive range of motion;Therapeutic activities;Patient/family education    Plan Supine scapular and shoulder mobility needs HEP    Consulted  and Agree with Plan of Care Patient           Patient will benefit from skilled therapeutic intervention in order to improve the following deficits and impairments:   Body Structure / Function / Physical Skills: ADL,Dexterity,Flexibility,Muscle spasms,ROM,Strength,Vision,Tone,IADL,FMC,Coordination,Balance,Body mechanics,Decreased knowledge of precautions,Endurance,Pain,Sensation,UE functional use,Vestibular,Proprioception,GMC,Decreased knowledge of use of DME Cognitive Skills: Attention,Memory,Sequencing,Energy/Drive,Problem Solve,Understand     Visit Diagnosis: 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 Nausea without vomiting   . Hypoalbuminemia due to protein-calorie malnutrition (HCC)   . Hyponatremia   . Essential hypertension   . 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 Chronic diastolic congestive heart failure (HCC)   . Dyslipidemia   . Right hemiparesis (HCC)   . Acute CVA (cerebrovascular accident) (HCC) 02/28/2021  . Substance use disorder 02/28/2021  . OBESITY 05/13/2009  . Hypertension 05/13/2009  . STROKE 05/13/2009  . BACK PAIN 05/13/2009  . SHORTNESS OF BREATH 05/13/2009  . CHEST PAIN 05/13/2009    Collier SalinaGellert, Rasean Joos M, OTR/L 03/25/2021, 3:37 PM  [facility] [address] [place], [place], [address] Phone: [phone]   Fax:  [phone]  Name: Charles Callahan MRN: [id number] Date of Birth: 08/14/1971

## 2021-03-25 NOTE — Patient Instructions (Signed)
Memory Compensation Strategies  1. Use "WARM" strategy. W= write it down A=  associate it R=  repeat it M=  make a mental picture  2. You can keep a Glass blower/designer. Use a 3-ring notebook with sections for the following:  calendar, important names and phone numbers, medications, doctors' names/phone numbers, "to do list"/reminders, and a section to journal what you did each day  3. Use a calendar to write appointments down.  4. Write yourself a schedule for the day.  This can be placed on the calendar or in a separate section of the Memory Notebook.  Keeping a regular schedule can help memory.  5. Use medication organizer with sections for each day or morning/evening pills  You may need help loading it  6. Keep a basket, or pegboard by the door.   Place items that you need to take out with you in the basket or on the pegboard.  You may also want to include a message board for reminders.  7. Use sticky notes. Place sticky notes with reminders in a place where the task is performed.  For example:  "turn off the stove" placed by the stove, "lock the door" placed on the door at eye level, "take your medications" on the bathroom mirror or by the place where you normally take your medications  8. Use alarms/timers.  Use while cooking to remind yourself to check on food or as a reminder to take your medicine, or as a reminder to make a call, or as a reminder to perform another task, etc.  9. Use a voice recorder app or small tape recorder to record important information and notes for yourself. Go back at the end of the day and listen to these.  _____________________________________________________________________________________________________________________________________  Strategies for Improving Your Attention and Memory  Use good eye-contact  Give the speaker your undivided attention  Look directly at the speaker  Complete one task at a time  Avoid multitasking  Complete  one task before starting a new one  Write a note to yourself if you think of something else that needs to be done  Let others know when you need quiet time and can't be interrupted  Don't answer the phone, texts, or emails while you are working on another task  Put aside distracting thoughts  If you find your mind wandering, refocus your attention on the speaker  Avoid off-topic comments or responses that may divert your attention   If something important comes to mind, let the speaker know and pause to write yourself a note: "Do you mind holding on a minute, I have to write something down."  Put thoughts on hold and focus on salient information  Limit distractions in your environment  Think about the environment around you  Limit background noise by turning off the TV or music, putting your phone away  Close the door and work in quiet  Use active listening  Actively participate in the conversation to stay focused  Paraphrase what you have heard to include the most important details  Adding some associations may help you remember  Ask questions to clarify certain points  Summarize the speaker's comments periodically  Avoid nodding your head and using "mhm" responses as these are more passive and don't help your attention  Alert the other person/people  It may be helpful to alert your listener to the fact that you may need reminders to keep on track  Tell the speaker in advance that you may need to  stop them and have them repeat salient information  If you lose focus, interject and let the person know, "I'm sorry, I lost you, can you tell me again?"  Write down information  Write down pertinent information as it comes up, such as telephone numbers, names of people, addresses, details from appointments and conversations, etc.

## 2021-03-27 ENCOUNTER — Encounter: Payer: Self-pay | Admitting: Physical Medicine and Rehabilitation

## 2021-03-27 ENCOUNTER — Other Ambulatory Visit: Payer: Self-pay

## 2021-03-27 ENCOUNTER — Encounter
Payer: Medicaid Other | Attending: Physical Medicine and Rehabilitation | Admitting: Physical Medicine and Rehabilitation

## 2021-03-27 VITALS — BP 125/86 | HR 83 | Temp 97.7°F | Ht 66.0 in | Wt 220.0 lb

## 2021-03-27 DIAGNOSIS — I639 Cerebral infarction, unspecified: Secondary | ICD-10-CM

## 2021-03-27 DIAGNOSIS — M67911 Unspecified disorder of synovium and tendon, right shoulder: Secondary | ICD-10-CM | POA: Diagnosis not present

## 2021-03-27 DIAGNOSIS — K1379 Other lesions of oral mucosa: Secondary | ICD-10-CM | POA: Diagnosis not present

## 2021-03-27 DIAGNOSIS — S43001S Unspecified subluxation of right shoulder joint, sequela: Secondary | ICD-10-CM

## 2021-03-27 MED ORDER — VITAMIN D3 25 MCG PO TABS: 1000.0000 [IU] | ORAL_TABLET | Freq: Every day | ORAL | 0 refills | Status: DC

## 2021-03-27 MED ORDER — BENZOCAINE 10 % MT GEL: 1.0000 "application " | OROMUCOSAL | 0 refills | Status: DC | PRN

## 2021-03-27 MED ORDER — NICOTINE 14 MG/24HR TD PT24: MEDICATED_PATCH | TRANSDERMAL | 3 refills | Status: DC

## 2021-03-27 MED ORDER — LIDOCAINE 5 % EX PTCH: 1.0000 | MEDICATED_PATCH | CUTANEOUS | 0 refills | Status: DC

## 2021-03-27 MED ORDER — BENZONATATE 100 MG PO CAPS: 100.0000 mg | ORAL_CAPSULE | Freq: Three times a day (TID) | ORAL | 0 refills | Status: DC | PRN

## 2021-03-27 MED ORDER — LIDOCAINE 5 % EX PTCH: 2.0000 | MEDICATED_PATCH | CUTANEOUS | 0 refills | Status: DC

## 2021-03-27 NOTE — Progress Notes (Signed)
Subjective:    Patient ID: Charles Callahan, male    DOB: 25-Aug-1971, 50 y.o.   MRN: [id number]  HPI Charles Callahan Patient is a 50 year old man who presents for transitional care follow-up after CIR admission for CVA.   1) R shoulder pain - lidocaine patch helps but does not cover whole area of pain -also has limited abduction to 90 degrees in this shoulder  2) Cough -whenever he coughs his pain worsens  3) Confusion:  -daughter notes confusion -he is not currently enrolled in SLP.   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   Pain Inventory Average Pain 5 Pain Right Now 5 My pain is sharp, stabbing, tingling, and aching  LOCATION OF PAIN  leg , hip  BOWEL Number of stools per week: 2 Oral laxative use No  Type of laxative n/a Enema or suppository use No  History of colostomy No  Incontinent No   BLADDER Normal In and out cath, frequency n/a Able to self cath Yes  Bladder incontinence Yes  Frequent urination Yes  Leakage with coughing No  Difficulty starting stream No  Incomplete bladder emptying No    Mobility use a cane how many minutes can you walk? 6-7 mins ability to climb steps?  yes do you drive?  no Do you have any goals in this area?  yes  Function not employed: date last employed not sure  Neuro/Psych tingling confusion depression  Prior Studies N/a  Physicians involved in your care N/a   Family History  Problem Relation Age of Onset   Diabetes Mother    Hypertension Mother    Social History   Socioeconomic History   Marital status: Married    Spouse name: Not on file   Number of children: Not on file   Years of education: Not on file   Highest education level: Not on file  Occupational History   Not on file  Tobacco Use   Smoking status: Some Days    Packs/day: 1.00    Pack years: 0.00     Types: Cigarettes   Smokeless tobacco: Never  Vaping Use   Vaping Use: Former  Substance and Sexual Activity   Alcohol use: Yes    Comment: rare   Drug use: Yes    Types: Cocaine   Sexual activity: Yes  Other Topics Concern   Not on file  Social History Narrative   ** Merged History Encounter **       Social Determinants of Health   Financial Resource Strain: Not on file  Food Insecurity: Food Insecurity Present   Worried About Programme researcher, broadcasting/film/video in the Last Year: Sometimes true   Barista in the Last Year: Sometimes true  Transportation Needs: Personal assistant (Medical): Yes   Lack of Transportation (Non-Medical): Yes  Physical Activity: Not on file  Stress: Not on file  Social Connections: Not on file   Past Surgical History:  Procedure Laterality Date   EYE SURGERY     Past Medical History:  Diagnosis Date   CHF (congestive heart failure) (HCC)    Hypertension    BP 125/86 (BP Location: Left Arm, Patient Position: Sitting)   Pulse 83   Temp 97.7 F (36.5 C)   Ht 5\' 6"  (1.676 m)  Wt 220 lb (99.8 kg)   SpO2 97%   BMI 35.51 kg/m   Opioid Risk Score:   Fall Risk Score:  `1  Depression screen PHQ 2/9  Depression screen [facility] 2/9 03/27/2021 06/14/2015  Decreased Interest 1 0  Down, Depressed, Hopeless 2 0  PHQ - 2 Score 3 0  Altered sleeping 0 -  Tired, decreased energy 3 -  Change in appetite 0 -  Feeling bad or failure about yourself  3 -  Trouble concentrating 3 -  Moving slowly or fidgety/restless 3 -  Suicidal thoughts 0 -  PHQ-9 Score 15 -       Review of Systems  Constitutional:  Positive for unexpected weight change.       Weight gain  HENT: Negative.    Eyes: Negative.   Respiratory:  Positive for cough and wheezing.   Cardiovascular: Negative.   Gastrointestinal: Negative.   Endocrine: Negative.   Genitourinary: Negative.   Musculoskeletal:  Positive for gait problem.       Hip pain  Skin:   Positive for rash.  Allergic/Immunologic: Negative.   Neurological:  Positive for tremors.  Hematological: Negative.   Psychiatric/Behavioral:  Positive for confusion.       Objective:   Physical Exam  Gen: no distress, normal appearing HEENT: oral mucosa pink and moist, NCAT Cardio: Reg rate Chest: normal effort, normal rate of breathing, +cough Abd: soft, non-distended Ext: no edema Psych: pleasant, normal affect Skin: intact Neuro: Alert and oriented x3 Musculoskeletal: right shoulder abduction limited to 90 degrees, 4/5 strength in right arm and leg. Antalgic gait.      Assessment & Plan:  Charles Callahan Patient is a 50 year old man who presents for transitional care f/u after CVA.  1) CVA -all medications reviewed and he is taking as prescribed  -he does not require refills  2) Right shoulder pain 2/2 subluxation/rotator cuff tendonitis -increase lidocaine patches to 2 per day, discussed can increase to 3 if needed -Discussed [employer] PNS system as an option of pain treatment via neuromodulation. Provided following link for patient to learn more about the system: https://www.spr[employer].com/.  Scheduled for September 27th.  -refilled vitamin D  3) Impaired mobility and ADLs -continue outpatient PT and OT. I will review notes and coordinate with therapists as needed  4) Confusion: -ordered SLP  5) GERD: -infrequent -d/c pantoprazole given side effects -advised small meals, avoiding spicy foods  6) Cough -tessalon pearles prescribed  7) oral pain: Oragel ordered -dentistry referral placed  8) HTN: -controlled with current medications, continue  9) Active smoking -currently 1 per day- discussed risks and made goal to stop completely -nicotine patch prescribed  Current impairments: Impaired mobility and ADLs, confusion  The patient's medical and/or psychosocial problems require moderate decision-making during transitions in care from inpatient rehabilitation to  home. This transitional care appointment included review of the patient's hospital discharge summary, review of the patient's hospital diagnostic tests and discussion of appropriate follow-up, education of the patient regarding their condition, re-establishment of necessary referrals. I will be reviewing patient's home and/or outpatient therapy notes as they progress through therapy and corresponding with therapists accordingly. I have encouraged compliance with current medication regimen (with adjustment to regimen as needed), follow-up with necessary providers, and the importance of following a healthy diet and exercise routine to maximize recovery, health, and quality of life.

## 2021-04-01 ENCOUNTER — Ambulatory Visit: Payer: Medicaid Other | Admitting: Occupational Therapy

## 2021-04-01 ENCOUNTER — Other Ambulatory Visit: Payer: Self-pay

## 2021-04-01 ENCOUNTER — Encounter: Payer: Self-pay | Admitting: Occupational Therapy

## 2021-04-01 VITALS — BP 154/89 | HR 67

## 2021-04-01 DIAGNOSIS — R41841 Cognitive communication deficit: Secondary | ICD-10-CM | POA: Diagnosis not present

## 2021-04-01 DIAGNOSIS — M6281 Muscle weakness (generalized): Secondary | ICD-10-CM

## 2021-04-01 DIAGNOSIS — M25511 Pain in right shoulder: Secondary | ICD-10-CM

## 2021-04-01 DIAGNOSIS — R278 Other lack of coordination: Secondary | ICD-10-CM

## 2021-04-01 DIAGNOSIS — R41840 Attention and concentration deficit: Secondary | ICD-10-CM

## 2021-04-01 DIAGNOSIS — I69951 Hemiplegia and hemiparesis following unspecified cerebrovascular disease affecting right dominant side: Secondary | ICD-10-CM

## 2021-04-01 DIAGNOSIS — R2681 Unsteadiness on feet: Secondary | ICD-10-CM

## 2021-04-01 NOTE — Therapy (Signed)
[facility] [address] [place], [place], [address] Phone: [phone]   Fax:  [phone]  Occupational Therapy Treatment  Patient Details  Name: Charles Callahan MRN: [id number] Date of Birth: 01/20/1971 Referring Provider (OT): Deatra Ina   Encounter Date: 04/01/2021   OT End of Session - 04/01/21 1448     Visit Number 2    Number of Visits 13    Date for OT Re-Evaluation 05/24/21    Authorization Type medicaid pending    OT Start Time 1445    OT Stop Time 1530    OT Time Calculation (min) 45 min    Activity Tolerance Patient tolerated treatment well    Behavior During Therapy [facility] for tasks assessed/performed             Past Medical History:  Diagnosis Date   CHF (congestive heart failure) (HCC)    Hypertension     Past Surgical History:  Procedure Laterality Date   EYE SURGERY      Vitals:   04/01/21 1447  BP: (!) 154/89  Pulse: 67     Subjective Assessment - 04/01/21 1447     Subjective  Just took the pain patch off so it's about a 4 right now.    Currently in Pain? Yes    Pain Score 4     Pain Location Shoulder    Pain Orientation Right    Pain Descriptors / Indicators Aching    Pain Type Acute pain    Pain Onset 1 to 4 weeks ago    Pain Frequency Intermittent    Aggravating Factors  raising it up    Pain Relieving Factors pain patch - lidocaine             Supine shoulder exercises with unweighted dowel. Pt unable to tolerate d/t pain and coughing d/t being supine   Table slides x 10 reps for increased shoulder range of motion with no reports of pain  Physioball x 10 for development of reach - no reports of pain  Cane seated with RUE using patient's quad cane and forward reaching x 10 reps.   Scapular mobilizations and encouraging patient to increase scap retraction and downward rotation d/t excessive forward humeral head with shoulder flexion and movements.   Reviewed  sleep positions but unable to demonstrate and physical go over sleep positions this session. To review next session.                      OT Short Term Goals - 03/25/21 1527       OT SHORT TERM GOAL #1   Title Patient will complete HEP designed to reduce shoulder pain    Baseline No HEP    Time 4    Period Weeks    Status New    Target Date 04/24/21      OT SHORT TERM GOAL #2   Title Patient will demonstrate awareness of positioning for comfort and support of RUE in bed    Baseline difficulty sleeping due to pain    Time 4    Period Weeks    Status New      OT SHORT TERM GOAL #3   Title Patient will report pain no greater than 2/10 during ADL    Baseline PAIN 8/10    Time 4    Period Weeks    Status New  OT Long Term Goals - 04/01/21 1450       OT LONG TERM GOAL #1   Title Patient will demonstrate at least 120 degrees of passive shoulder motion (flexion/abduction)    Baseline 80    Time 6    Period Weeks    Status New      OT LONG TERM GOAL #2   Title Patient will demonstrate functional use of dominant RUE to feed self at least 50% of meal using utensil    Baseline feeding self with LUE 100%    Time 6    Period Weeks    Status On-going   pt reports feeding self with RUE 100% 04/01/2021     OT LONG TERM GOAL #3   Title Patient will complete 9 hole peg test in less than 30 seconds with RUE    Baseline +35 SEC    Time 6    Period Weeks    Status New      OT LONG TERM GOAL #4   Title Patient will demonstrate at least 5 lb increase in grip strength in RUE    Baseline 10    Time 6    Period Weeks    Status New      OT LONG TERM GOAL #5   Title Patient will obtain lightweight object from eye level shelf with active assisted motion of RUE and no increase in pain in right shoulder.    Baseline unable    Time 6    Period Weeks    Status New                   Plan - 04/01/21 1535     Clinical Impression Statement  Pt is progressing towards goals with increased functional use of RUE with feeding self and other ADLs and IADLs. Pt demonstrates significant forward humeral head and rotation with shoulder movements with RUE.    OT Occupational Profile and History Detailed Assessment- Review of Records and additional review of physical, cognitive, psychosocial history related to current functional performance    Occupational performance deficits (Please refer to evaluation for details): ADL's;IADL's;Work;Rest and Sleep    Body Structure / Function / Physical Skills ADL;Dexterity;Flexibility;Muscle spasms;ROM;Strength;Vision;Tone;IADL;FMC;Coordination;Balance;Body mechanics;Decreased knowledge of precautions;Endurance;Pain;Sensation;UE functional use;Vestibular;Proprioception;GMC;Decreased knowledge of use of DME    Cognitive Skills Attention;Memory;Sequencing;Energy/Drive;Problem Solve;Understand    Rehab Potential Good    Clinical Decision Making Several treatment options, min-mod task modification necessary    Comorbidities Affecting Occupational Performance: May have comorbidities impacting occupational performance    Modification or Assistance to Complete Evaluation  Min-Moderate modification of tasks or assist with assess necessary to complete eval    OT Frequency 2x / week    OT Duration 6 weeks    OT Treatment/Interventions Self-care/ADL training;Therapeutic exercise;DME and/or AE instruction;Functional Mobility Training;Cognitive remediation/compensation;Balance training;Visual/perceptual remediation/compensation;Splinting;Manual Therapy;Neuromuscular education;Fluidtherapy;Paraffin;Electrical Stimulation;Aquatic Therapy;Moist Heat;Contrast Bath;Passive range of motion;Therapeutic activities;Patient/family education    Plan Supine scapular and shoulder mobility needs HEP, review sleep positions    Consulted and Agree with Plan of Care Patient             Patient will benefit from skilled therapeutic  intervention in order to improve the following deficits and impairments:   Body Structure / Function / Physical Skills: ADL, Dexterity, Flexibility, Muscle spasms, ROM, Strength, Vision, Tone, IADL, FMC, Coordination, Balance, Body mechanics, Decreased knowledge of precautions, Endurance, Pain, Sensation, UE functional use, Vestibular, Proprioception, GMC, Decreased knowledge of use of DME Cognitive Skills: Attention, Memory, Sequencing, Energy/Drive, Problem  Solve, Understand     Visit Diagnosis: 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)  Muscle weakness (generalized)  Unsteadiness on feet  Acute pain of right shoulder  Attention and concentration deficit  Other lack of coordination    Problem List Patient Active Problem List   Diagnosis Date Noted   Nausea without vomiting    Hypoalbuminemia due to protein-calorie malnutrition (HCC)    Hyponatremia    Essential hypertension    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 [place], [place], [address] Phone: [phone]   Fax:  [phone]  Name: Charles Callahan MRN: [id number] Date of Birth: Jan 13, 1971

## 2021-04-03 ENCOUNTER — Ambulatory Visit: Payer: Medicaid Other | Admitting: Occupational Therapy

## 2021-04-08 ENCOUNTER — Ambulatory Visit: Payer: Medicaid Other | Admitting: Occupational Therapy

## 2021-04-08 ENCOUNTER — Other Ambulatory Visit: Payer: Self-pay

## 2021-04-08 DIAGNOSIS — R41840 Attention and concentration deficit: Secondary | ICD-10-CM

## 2021-04-08 DIAGNOSIS — M6281 Muscle weakness (generalized): Secondary | ICD-10-CM

## 2021-04-08 DIAGNOSIS — I69951 Hemiplegia and hemiparesis following unspecified cerebrovascular disease affecting right dominant side: Secondary | ICD-10-CM

## 2021-04-08 DIAGNOSIS — R208 Other disturbances of skin sensation: Secondary | ICD-10-CM

## 2021-04-08 DIAGNOSIS — R41842 Visuospatial deficit: Secondary | ICD-10-CM

## 2021-04-08 DIAGNOSIS — R2681 Unsteadiness on feet: Secondary | ICD-10-CM

## 2021-04-08 DIAGNOSIS — R278 Other lack of coordination: Secondary | ICD-10-CM

## 2021-04-08 DIAGNOSIS — M25511 Pain in right shoulder: Secondary | ICD-10-CM

## 2021-04-08 DIAGNOSIS — R41841 Cognitive communication deficit: Secondary | ICD-10-CM | POA: Diagnosis not present

## 2021-04-08 NOTE — Therapy (Signed)
[facility] [address] [place], [place], [address] Phone: [phone]   Fax:  [phone]  Occupational Therapy Treatment  Patient Details  Name: Charles Callahan MRN: [id number] Date of Birth: 06/05/1971 Referring Provider (OT): Deatra Ina   Encounter Date: 04/08/2021   OT End of Session - 04/08/21 1319     Visit Number 3    Number of Visits 13    Date for OT Re-Evaluation 05/24/21    Authorization Type medicaid pending    OT Start Time 1318    OT Stop Time 1400    OT Time Calculation (min) 42 min    Activity Tolerance Patient tolerated treatment well    Behavior During Therapy [facility]FL for tasks assessed/performed             Past Medical History:  Diagnosis Date   CHF (congestive heart failure) (HCC)    Hypertension     Past Surgical History:  Procedure Laterality Date   EYE SURGERY      There were no vitals filed for this visit.   Subjective Assessment - 04/08/21 1320     Subjective  I have been working my arm in the jacuzzi    Currently in Pain? Yes    Pain Score 3     Pain Location Shoulder    Pain Orientation Right    Pain Descriptors / Indicators Aching    Pain Type Acute pain    Pain Onset 1 to 4 weeks ago    Pain Frequency Intermittent    Aggravating Factors  mostly when sleeping             ADLs reviewed sleeping positions  RUE shoulder Pt with significant anterior pull of RUE shoulder with any reaching. Pt worked on [employer] for [employer] on [employer] at incline, abducted on physioball and gentle mobilizations, shoulder flexion and chest press with medium size ball x 10 reps with emphasis on retraction and downward rotation of scap. UE ranger with RUE reach and stabilization and control. Pt with some reports of pain but pain decreased with exercises. All exercises completed in sitting on edge of mat.   Education on possible Aquatics therapy. Will monitor and address as  needed.                     OT Education - 04/08/21 1639     Education Details education on aquatics - continue to assess appropriateness; sleep positions    Person(s) Educated Patient    Methods Explanation;Demonstration    Comprehension Verbalized understanding;Returned demonstration              OT Short Term Goals - 03/25/21 1527       OT SHORT TERM GOAL #1   Title Patient will complete HEP designed to reduce shoulder pain    Baseline No HEP    Time 4    Period Weeks    Status New    Target Date 04/24/21      OT SHORT TERM GOAL #2   Title Patient will demonstrate awareness of positioning for comfort and support of RUE in bed    Baseline difficulty sleeping due to pain    Time 4    Period Weeks    Status New      OT SHORT TERM GOAL #3   Title Patient will report pain no greater than 2/10 during ADL    Baseline PAIN 8/10    Time 4  Period Weeks    Status New               OT Long Term Goals - 04/01/21 1450       OT LONG TERM GOAL #1   Title Patient will demonstrate at least 120 degrees of passive shoulder motion (flexion/abduction)    Baseline 80    Time 6    Period Weeks    Status New      OT LONG TERM GOAL #2   Title Patient will demonstrate functional use of dominant RUE to feed self at least 50% of meal using utensil    Baseline feeding self with LUE 100%    Time 6    Period Weeks    Status On-going   pt reports feeding self with RUE 100% 04/01/2021     OT LONG TERM GOAL #3   Title Patient will complete 9 hole peg test in less than 30 seconds with RUE    Baseline +35 SEC    Time 6    Period Weeks    Status New      OT LONG TERM GOAL #4   Title Patient will demonstrate at least 5 lb increase in grip strength in RUE    Baseline 10    Time 6    Period Weeks    Status New      OT LONG TERM GOAL #5   Title Patient will obtain lightweight object from eye level shelf with active assisted motion of RUE and no increase in  pain in right shoulder.    Baseline unable    Time 6    Period Weeks    Status New                   Plan - 04/08/21 1638     Clinical Impression Statement Pt is progressing towards increased functional reach with RUE however continues to present with anterior shoulder with reach.    OT Occupational Profile and History Detailed Assessment- Review of Records and additional review of physical, cognitive, psychosocial history related to current functional performance    Occupational performance deficits (Please refer to evaluation for details): ADL's;IADL's;Work;Rest and Sleep    Body Structure / Function / Physical Skills ADL;Dexterity;Flexibility;Muscle spasms;ROM;Strength;Vision;Tone;IADL;FMC;Coordination;Balance;Body mechanics;Decreased knowledge of precautions;Endurance;Pain;Sensation;UE functional use;Vestibular;Proprioception;GMC;Decreased knowledge of use of DME    Cognitive Skills Attention;Memory;Sequencing;Energy/Drive;Problem Solve;Understand    Rehab Potential Good    Clinical Decision Making Several treatment options, min-mod task modification necessary    Comorbidities Affecting Occupational Performance: May have comorbidities impacting occupational performance    Modification or Assistance to Complete Evaluation  Min-Moderate modification of tasks or assist with assess necessary to complete eval    OT Frequency 2x / week    OT Duration 6 weeks    OT Treatment/Interventions Self-care/ADL training;Therapeutic exercise;DME and/or AE instruction;Functional Mobility Training;Cognitive remediation/compensation;Balance training;Visual/perceptual remediation/compensation;Splinting;Manual Therapy;Neuromuscular education;Fluidtherapy;Paraffin;Electrical Stimulation;Aquatic Therapy;Moist Heat;Contrast Bath;Passive range of motion;Therapeutic activities;Patient/family education    Plan Supine scapular and shoulder mobility needs HEP    Consulted and Agree with Plan of Care Patient              Patient will benefit from skilled therapeutic intervention in order to improve the following deficits and impairments:   Body Structure / Function / Physical Skills: ADL, Dexterity, Flexibility, Muscle spasms, ROM, Strength, Vision, Tone, IADL, FMC, Coordination, Balance, Body mechanics, Decreased knowledge of precautions, Endurance, Pain, Sensation, UE functional use, Vestibular, Proprioception, GMC, Decreased knowledge of use of DME Cognitive Skills: Attention,  Memory, Sequencing, Energy/Drive, Problem Solve, Understand     Visit Diagnosis: 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)  Muscle weakness (generalized)  Unsteadiness on feet  Attention and concentration deficit  Acute pain of right shoulder  Visuospatial deficit  Other lack of coordination  Other disturbances of skin sensation    Problem List Patient Active Problem List   Diagnosis Date Noted   Nausea without vomiting    Hypoalbuminemia due to protein-calorie malnutrition (HCC)    Hyponatremia    Essential hypertension    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 [place], [place], [address] Phone: [phone]   Fax:  [phone]  Name: Charles Callahan MRN: [id number] Date of Birth: [birth year]

## 2021-04-10 ENCOUNTER — Encounter: Payer: Self-pay | Admitting: Occupational Therapy

## 2021-04-10 ENCOUNTER — Encounter: Payer: Self-pay | Admitting: Physical Medicine and Rehabilitation

## 2021-04-10 ENCOUNTER — Other Ambulatory Visit: Payer: Self-pay

## 2021-04-10 ENCOUNTER — Ambulatory Visit: Payer: Medicaid Other | Admitting: Occupational Therapy

## 2021-04-10 DIAGNOSIS — R278 Other lack of coordination: Secondary | ICD-10-CM

## 2021-04-10 DIAGNOSIS — M6281 Muscle weakness (generalized): Secondary | ICD-10-CM

## 2021-04-10 DIAGNOSIS — I69951 Hemiplegia and hemiparesis following unspecified cerebrovascular disease affecting right dominant side: Secondary | ICD-10-CM

## 2021-04-10 DIAGNOSIS — R2681 Unsteadiness on feet: Secondary | ICD-10-CM

## 2021-04-10 DIAGNOSIS — R208 Other disturbances of skin sensation: Secondary | ICD-10-CM

## 2021-04-10 DIAGNOSIS — M25511 Pain in right shoulder: Secondary | ICD-10-CM

## 2021-04-10 DIAGNOSIS — R41840 Attention and concentration deficit: Secondary | ICD-10-CM

## 2021-04-10 DIAGNOSIS — R41842 Visuospatial deficit: Secondary | ICD-10-CM

## 2021-04-10 DIAGNOSIS — R41841 Cognitive communication deficit: Secondary | ICD-10-CM | POA: Diagnosis not present

## 2021-04-10 NOTE — Therapy (Signed)
[facility] [address] [place], [place], [address] Phone: [phone]   Fax:  [phone]  Occupational Therapy Treatment  Patient Details  Name: Charles Callahan MRN: [id number] Date of Birth: 1971/08/15 Referring Provider (OT): Deatra Ina   Encounter Date: 04/10/2021   OT End of Session - 04/10/21 1526     Visit Number 4    Number of Visits 13    Date for OT Re-Evaluation 05/24/21    Authorization Type medicaid pending    OT Start Time 1230    OT Stop Time 1300    OT Time Calculation (min) 30 min    Activity Tolerance Patient limited by pain    Behavior During Therapy [facility] for tasks assessed/performed             Past Medical History:  Diagnosis Date   CHF (congestive heart failure) (HCC)    Hypertension     Past Surgical History:  Procedure Laterality Date   EYE SURGERY      There were no vitals filed for this visit.   Subjective Assessment - 04/10/21 1306     Subjective  Feels he is getting better, pain lessening - although 5/10 at rest in right shoulder    Currently in Pain? Yes    Pain Score 5     Pain Location Shoulder    Pain Orientation Right    Pain Descriptors / Indicators Aching    Pain Type Chronic pain    Pain Onset 1 to 4 weeks ago    Pain Frequency Constant    Aggravating Factors  movement    Pain Relieving Factors pain patch    Effect of Pain on Daily Activities limiting all movement RUE    Multiple Pain Sites Yes    Pain Score 5    Pain Location Hip    Pain Orientation Right    Pain Descriptors / Indicators Aching    Pain Type Acute pain    Pain Onset In the past 7 days    Pain Frequency Intermittent    Aggravating Factors  Movement                          OT Treatments/Exercises (OP) - 04/10/21 0001       Neurological Re-education Exercises   Other Exercises 1 Neuromuscular reeducation to address arm on body movement.  Patient with very painful  and tight shoulder - feels like nearly constant spasming.  Anterior displacement of humeral head.  Limited scapular mobility.  Patient may benefit from medication to reduce muscle tension.  Worked on rolling from supine toward left with right arm in progressively more shoulder abduction range.  Difficulty rolling toward right side - needed to grade motion in this direction.      Manual Therapy   Scapular Mobilization Working to improve scapular mobility elevation depression, add/abduction.  Patient did best when able to coordiante breathing with range of motion.                    OT Education - 04/10/21 1313     Education Details relaxation techniques, arm on body stretching, may benefit from medication for mm tone    Person(s) Educated Patient    Methods Explanation    Comprehension Need further instruction              OT Short Term Goals - 04/10/21 1527  OT SHORT TERM GOAL #1   Title Patient will complete HEP designed to reduce shoulder pain    Baseline No HEP    Time 4    Period Weeks    Status On-going    Target Date 04/24/21      OT SHORT TERM GOAL #2   Title Patient will demonstrate awareness of positioning for comfort and support of RUE in bed    Baseline difficulty sleeping due to pain    Time 4    Period Weeks    Status On-going      OT SHORT TERM GOAL #3   Title Patient will report pain no greater than 2/10 during ADL    Baseline PAIN 8/10    Time 4    Period Weeks    Status On-going               OT Long Term Goals - 04/10/21 1527       OT LONG TERM GOAL #1   Title Patient will demonstrate at least 120 degrees of passive shoulder motion (flexion/abduction)    Baseline 80    Time 6    Period Weeks    Status On-going      OT LONG TERM GOAL #2   Title Patient will demonstrate functional use of dominant RUE to feed self at least 50% of meal using utensil    Baseline feeding self with LUE 100%    Time 6    Period Weeks     Status On-going   pt reports feeding self with RUE 100% 04/01/2021     OT LONG TERM GOAL #3   Title Patient will complete 9 hole peg test in less than 30 seconds with RUE    Baseline +35 SEC    Time 6    Period Weeks    Status On-going      OT LONG TERM GOAL #4   Title Patient will demonstrate at least 5 lb increase in grip strength in RUE    Baseline 10    Time 6    Period Weeks    Status On-going      OT LONG TERM GOAL #5   Title Patient will obtain lightweight object from eye level shelf with active assisted motion of RUE and no increase in pain in right shoulder.    Baseline unable    Time 6    Period Weeks    Status On-going                   Plan - 04/10/21 1526     Clinical Impression Statement Pt is significantly limited by shoulder pain and stiffness    OT Occupational Profile and History Detailed Assessment- Review of Records and additional review of physical, cognitive, psychosocial history related to current functional performance    Occupational performance deficits (Please refer to evaluation for details): ADL's;IADL's;Work;Rest and Sleep    Body Structure / Function / Physical Skills ADL;Dexterity;Flexibility;Muscle spasms;ROM;Strength;Vision;Tone;IADL;FMC;Coordination;Balance;Body mechanics;Decreased knowledge of precautions;Endurance;Pain;Sensation;UE functional use;Vestibular;Proprioception;GMC;Decreased knowledge of use of DME    Cognitive Skills Attention;Memory;Sequencing;Energy/Drive;Problem Solve;Understand    Rehab Potential Good    Clinical Decision Making Several treatment options, min-mod task modification necessary    Comorbidities Affecting Occupational Performance: May have comorbidities impacting occupational performance    Modification or Assistance to Complete Evaluation  Min-Moderate modification of tasks or assist with assess necessary to complete eval    OT Frequency 2x / week    OT Duration 6 weeks  OT Treatment/Interventions  Self-care/ADL training;Therapeutic exercise;DME and/or AE instruction;Functional Mobility Training;Cognitive remediation/compensation;Balance training;Visual/perceptual remediation/compensation;Splinting;Manual Therapy;Neuromuscular education;Fluidtherapy;Paraffin;Electrical Stimulation;Aquatic Therapy;Moist Heat;Contrast Bath;Passive range of motion;Therapeutic activities;Patient/family education    Plan Supine scapular and shoulder mobility needs HEP    Consulted and Agree with Plan of Care Patient             Patient will benefit from skilled therapeutic intervention in order to improve the following deficits and impairments:   Body Structure / Function / Physical Skills: ADL, Dexterity, Flexibility, Muscle spasms, ROM, Strength, Vision, Tone, IADL, FMC, Coordination, Balance, Body mechanics, Decreased knowledge of precautions, Endurance, Pain, Sensation, UE functional use, Vestibular, Proprioception, GMC, Decreased knowledge of use of DME Cognitive Skills: Attention, Memory, Sequencing, Energy/Drive, Problem Solve, Understand     Visit Diagnosis: 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)  Muscle weakness (generalized)  Unsteadiness on feet  Attention and concentration deficit  Acute pain of right shoulder  Visuospatial deficit  Other lack of coordination  Other disturbances of skin sensation    Problem List Patient Active Problem List   Diagnosis Date Noted   Nausea without vomiting    Hypoalbuminemia due to protein-calorie malnutrition (HCC)    Hyponatremia    Essential hypertension    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

## 2021-04-11 ENCOUNTER — Other Ambulatory Visit: Payer: Self-pay | Admitting: Physical Medicine and Rehabilitation

## 2021-04-11 MED ORDER — BACLOFEN 10 MG PO TABS: 5.0000 mg | ORAL_TABLET | Freq: Three times a day (TID) | ORAL | 0 refills | Status: DC | PRN

## 2021-04-15 ENCOUNTER — Ambulatory Visit: Payer: Medicaid Other | Admitting: Occupational Therapy

## 2021-04-16 ENCOUNTER — Other Ambulatory Visit: Payer: Self-pay

## 2021-04-16 ENCOUNTER — Encounter: Payer: Self-pay | Admitting: Physician Assistant

## 2021-04-16 ENCOUNTER — Ambulatory Visit: Payer: Medicaid Other | Attending: Physician Assistant | Admitting: Physician Assistant

## 2021-04-16 DIAGNOSIS — I6350 Cerebral infarction due to unspecified occlusion or stenosis of unspecified cerebral artery: Secondary | ICD-10-CM

## 2021-04-16 DIAGNOSIS — E871 Hypo-osmolality and hyponatremia: Secondary | ICD-10-CM | POA: Diagnosis not present

## 2021-04-16 DIAGNOSIS — F1990 Other psychoactive substance use, unspecified, uncomplicated: Secondary | ICD-10-CM

## 2021-04-16 DIAGNOSIS — G8191 Hemiplegia, unspecified affecting right dominant side: Secondary | ICD-10-CM

## 2021-04-16 DIAGNOSIS — I10 Essential (primary) hypertension: Secondary | ICD-10-CM

## 2021-04-16 DIAGNOSIS — I5032 Chronic diastolic (congestive) heart failure: Secondary | ICD-10-CM

## 2021-04-16 DIAGNOSIS — J9801 Acute bronchospasm: Secondary | ICD-10-CM

## 2021-04-16 DIAGNOSIS — E785 Hyperlipidemia, unspecified: Secondary | ICD-10-CM

## 2021-04-16 DIAGNOSIS — Z09 Encounter for follow-up examination after completed treatment for conditions other than malignant neoplasm: Secondary | ICD-10-CM

## 2021-04-16 DIAGNOSIS — E041 Nontoxic single thyroid nodule: Secondary | ICD-10-CM

## 2021-04-16 MED ORDER — AMLODIPINE BESYLATE 10 MG PO TABS
10.0000 mg | ORAL_TABLET | Freq: Every day | ORAL | 3 refills | Status: DC
  Filled 2021-04-16: qty 30, 30d supply, fill #0

## 2021-04-16 MED ORDER — ALBUTEROL SULFATE HFA 108 (90 BASE) MCG/ACT IN AERS
1.0000 | INHALATION_SPRAY | Freq: Four times a day (QID) | RESPIRATORY_TRACT | 0 refills | Status: DC | PRN
  Filled 2021-04-16: qty 18, 25d supply, fill #0

## 2021-04-16 MED ORDER — PANTOPRAZOLE SODIUM 40 MG PO TBEC
40.0000 mg | DELAYED_RELEASE_TABLET | Freq: Every day | ORAL | 2 refills | Status: DC
  Filled 2021-04-16: qty 30, 30d supply, fill #0

## 2021-04-16 MED ORDER — ATORVASTATIN CALCIUM 80 MG PO TABS
80.0000 mg | ORAL_TABLET | Freq: Every day | ORAL | 3 refills | Status: DC
  Filled 2021-04-16: qty 30, 30d supply, fill #0

## 2021-04-16 MED ORDER — LISINOPRIL 20 MG PO TABS
20.0000 mg | ORAL_TABLET | Freq: Every day | ORAL | 3 refills | Status: DC
  Filled 2021-04-16: qty 30, 30d supply, fill #0

## 2021-04-16 MED ORDER — CETIRIZINE HCL 10 MG PO TABS
10.0000 mg | ORAL_TABLET | Freq: Every day | ORAL | 11 refills | Status: DC
  Filled 2021-04-16: qty 30, 30d supply, fill #0

## 2021-04-16 MED ORDER — VITAMIN D3 25 MCG PO TABS
1000.0000 [IU] | ORAL_TABLET | Freq: Every day | ORAL | 3 refills | Status: DC
  Filled 2021-04-16: qty 30, 30d supply, fill #0

## 2021-04-16 MED ORDER — CLOPIDOGREL BISULFATE 75 MG PO TABS
75.0000 mg | ORAL_TABLET | Freq: Every day | ORAL | 1 refills | Status: DC
  Filled 2021-04-16: qty 30, 30d supply, fill #0

## 2021-04-16 MED ORDER — ASPIRIN 325 MG PO TBEC
325.0000 mg | DELAYED_RELEASE_TABLET | Freq: Every day | ORAL | 3 refills | Status: DC
  Filled 2021-04-16: qty 30, 30d supply, fill #0

## 2021-04-16 MED ORDER — BACLOFEN 10 MG PO TABS
5.0000 mg | ORAL_TABLET | Freq: Three times a day (TID) | ORAL | 1 refills | Status: DC | PRN
  Filled 2021-04-16: qty 30, 20d supply, fill #0

## 2021-04-16 NOTE — Progress Notes (Signed)
Patient ID: Charles Callahan, male   DOB: 1970/11/23, 50 y.o.   MRN: [id number]  Virtual Visit via Telephone Note  I connected with Oswaldo Milian on 04/16/21 at 10:10 AM EDT by telephone and verified that I am speaking with the correct person using two identifiers.  Location: Patient: home Provider: [facility] office   I discussed the limitations, risks, security and privacy concerns of performing an evaluation and management service by telephone and the availability of in person appointments. I also discussed with the patient that there may be a patient responsible charge related to this service. The patient expressed understanding and agreed to proceed.   History of Present Illness: Was hospitalized 5/12-5/18 after presenting with acute onset R sided weakness.  PTA he was fully functioning.  He was subsequently admitted to inpatient rehab.  Most recent outpatient visit 04/10/2021.  He is able to get dressed on his own.  He stays with his mom and she cooks for him and helps him with anything he needs.  He feels he is getting stronger daily but has R sided hemiparesis.  He is checking his blood pressures at home and getting readings of ~120-128/low 80s.  From Inpatient rehab discharge summary: Admit date: 03/05/2021 Discharge date: 03/14/2021   Discharge Diagnoses:  Principal Problem:   Subcortical infarction [facility]) Active Problems:   Hypoalbuminemia due to protein-calorie malnutrition (HCC)   Hyponatremia   Essential hypertension   Nausea without vomiting Hyperlipidemia History of diastolic congestive heart failure Tobacco abuse Incidental finding thyroid nodule   Family history.  Mother with diabetes and hypertension.  Denies any colon cancer esophageal cancer or rectal cancer     Brief HPI:   Charles Callahan is a 50 y.o. right-handed male with history of diastolic congestive heart failure as well as hypertension and tobacco use.  Patient lives with parent.  Independent prior to admission.  1  level home with level entry.  Presented 02/27/2021 with acute onset of right side weakness.  Cranial CT scan unremarkable for acute intracranial process.  Area of low density in the cerebral hemisphere white matter consistent with small vessel ischemic change.  Patient did not receive tPA.  MRI showed multiple scattered cortical and subcortical acute ischemic nonhemorrhagic left cerebral infarctions.  No associated hemorrhage or mass-effect.  CT angiogram of the head and neck showed no hemodynamically significant stenosis of the neck however there was a 3-4 cm mass abutting or arising from the left thyroid lobe.  Ultrasound of thyroid showed a solitary 3.7 cm right thyroid nodule recommending percutaneous sampling which could  be done as outpatient.  Echocardiogram with ejection fraction of 55 to 60% no wall motion abnormalities grade 2 diastolic dysfunction.  Admission chemistries unremarkable except sodium 133 urine drug screen negative alcohol negative urinalysis negative nitrite.  Currently maintained on aspirin 325 mg daily and Plavix 75 mg daily for CVA prophylaxis x3 months then aspirin alone.  Subcutaneous Lovenox for DVT prophylaxis.  Therapy evaluations completed due to patient's right side weakness was admitted for a comprehensive rehab program.     Hospital Course: DEWELL MONNIER was admitted to rehab 03/05/2021 for inpatient therapies to consist of PT, ST and OT at least three hours five days a week. Past admission physiatrist, therapy team and rehab RN have worked together to provide customized collaborative inpatient rehab.  Pertaining to patient's multiple scattered cortical and subcortical ischemic nonhemorrhagic left cerebral infarctions remained stable.  Patient would remain on aspirin and Plavix x3 months then aspirin  alone.  Patient had been on Lovenox discontinued as he was now ambulating greater than 400 feet.  Blood pressure controlled on Norvasc's as well as lisinopril and would need  outpatient follow-up.  Lipitor ongoing for hyperlipidemia.  Noted history of diastolic congestive heart failure exhibited no signs of fluid overload.  He did have a history of tobacco use maintained on NicoDerm patch provided with counseling.  Incidental finding of thyroid nodule outpatient percutaneous sampling recommended.  Plan aspirin 325 mg daily and Plavix 75 mg daily x3 months then aspirin alone   Medications at discharge. 1.  Tylenol as needed 2.  Norvasc 10 mg p.o. daily 3.  Aspirin 325 mg p.o. daily 4.  Lipitor 80 mg p.o. daily 5.  Vitamin D 1000 units p.o. daily 6.  Plavix 75 mg p.o. daily 7.  Lidoderm patch as directed 8.  Lisinopril 10 mg p.o. daily 9.  NicoDerm patch as directed 10.  Protonix 40 mg p.o. 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 Primary hypertension controlled - amLODipine (NORVASC) 10 MG tablet; Take 1 tablet (10 mg total) by mouth daily.  Dispense: 30 tablet; Refill: 3 - lisinopril (ZESTRIL) 20 MG tablet; Take 1 tablet (20 mg total) by mouth daily.  Dispense: 30 tablet; Refill: 3 - Comprehensive metabolic panel; Future - CBC with Differential/Platelet; Future  2. Cerebral artery occlusion with cerebral infarction [facility]) Plan per hospital note is plavix and aspirin for 3 months then aspirin only.   - aspirin 325 MG EC tablet; Take 1 tablet (325 mg total) by mouth daily.  Dispense: 30 tablet; Refill: 3 - clopidogrel (PLAVIX) 75 MG tablet; Take 1 tablet (75 mg total) by mouth daily.  Dispense: 30 tablet; Refill: 1 - Comprehensive metabolic panel; Future -  CBC with Differential/Platelet; Future  3. Right hemiparesis (HCC) - Vitamin D3 (VITAMIN D) 25 MCG tablet; Take 1 tablet (1,000 Units total) by mouth daily.  Dispense: 30 tablet; Refill: 3 - baclofen (LIORESAL) 10 MG tablet; Take 0.5 tablets (5 mg total) by mouth 3 (three) times daily as needed for muscle spasms (shoulder pain/spasticity).  Dispense: 30 each; Refill: 1  4. Hyponatremia  - Comprehensive metabolic panel; Future  5. Substance use disorder [place].org is the website for narcotics anonymous  [employer]cy (website) or (323) 228-5495 is the information for alcoholics anonymous  Both are free and immediately available for help with alcohol and drug use   6. Chronic diastolic congestive heart failure (HCC)   7. Dyslipidemia - atorvastatin (LIPITOR) 80 MG tablet; Take 1 tablet (80 mg total) by mouth daily.  Dispense: 30 tablet; Refill: 3  8. Hospital discharge follow-up  9. Bronchospasm Rf albuterol  10. Thyroid nodule incidentally noted on imaging study He says biopsy is scheduled but he is unsure when.  We can follow up on this at his next appt to ensure it has been done - Thyroid Panel With TSH; Future   Follow Up Instructions: Assign PCP in about 6-8 weeks   I discussed the assessment and treatment plan with the patient. The patient was provided an opportunity to ask questions and all were answered. The patient agreed with the  plan and demonstrated an understanding of the instructions.   The patient was advised to call back or seek an in-person evaluation if the symptoms worsen or if the condition fails to improve as anticipated.  I provided 18 minutes of non-face-to-face time during this encounter.   Georgian Co, PA-C

## 2021-04-17 ENCOUNTER — Ambulatory Visit: Payer: Medicaid Other | Admitting: Occupational Therapy

## 2021-04-17 ENCOUNTER — Other Ambulatory Visit: Payer: Self-pay

## 2021-04-22 ENCOUNTER — Ambulatory Visit: Payer: Self-pay | Attending: Physician Assistant | Admitting: Occupational Therapy

## 2021-04-23 ENCOUNTER — Other Ambulatory Visit: Payer: Self-pay

## 2021-04-24 ENCOUNTER — Ambulatory Visit: Payer: Self-pay | Admitting: Occupational Therapy

## 2021-04-28 ENCOUNTER — Encounter: Payer: Self-pay | Admitting: Adult Health

## 2021-04-28 ENCOUNTER — Inpatient Hospital Stay: Payer: MEDICAID | Admitting: Adult Health

## 2021-04-28 ENCOUNTER — Telehealth: Payer: Self-pay

## 2021-04-28 NOTE — Progress Notes (Deleted)
[facility] [address] [place]. [place] [address] [phone]       HOSPITAL FOLLOW UP NOTE  [name] Date of Birth:  10-Mar-1971 Medical Record Number:  [id number]   Reason for Referral:  hospital stroke follow up    SUBJECTIVE:   CHIEF COMPLAINT:  No chief complaint on file.   HPI:   [name] is a 50 y.o. male with history of HTN and CdHF, and stroke (listed on chart but patient denies) who presented on 02/27/2021 with 1-2 day history of right sided weakness and numbness.  Personally reviewed hospitalization pertinent progress notes, lab work and imaging with summary provided.  Evaluated by Dr. Roda Shutters with stroke work-up revealing multiple scattered nonhemorrhagic infarcts involving the left cerebral hemisphere with involvement of the left frontal, parietal, temporal and occipital lobes likely in setting left M1 MCA stenosis as evidenced on CTA head/neck.  EF 55 to 60%.  LDL 129 and A1c 5.9.  Recommended DAPT for 3 months and aspirin alone as well as atorvastatin 80 mg daily.  Current tobacco use with smoking cessation counseling provided.  Recommend BP goal 130-150 given high-grade stenosis.  Residual deficits right-sided weakness and sensory impairment.  PT/OT recommended CIR.  Today, 04/28/2021, Charles Callahan is being seen for hospital follow-up.  Reports residual right-sided weakness ***.  Currently working with PT/OT with continued improvement.  Remains on aspirin and Plavix as well as atorvastatin without associated side effects.  Blood pressure today ***.       ROS:   14 system review of systems performed and negative with exception of ***  PMH:  Past Medical History:  Diagnosis Date   CHF (congestive heart failure) (HCC)    Hypertension     PSH:  Past Surgical History:  Procedure Laterality Date   EYE SURGERY      Social History:  Social History   Socioeconomic History   Marital status: Married    Spouse name: Not on file    Number of children: Not on file   Years of education: Not on file   Highest education level: Not on file  Occupational History   Not on file  Tobacco Use   Smoking status: Some Days    Packs/day: 1.00    Pack years: 0.00    Types: Cigarettes   Smokeless tobacco: Never  Vaping Use   Vaping Use: Former  Substance and Sexual Activity   Alcohol use: Yes    Comment: rare   Drug use: Yes    Types: Cocaine   Sexual activity: Yes  Other Topics Concern   Not on file  Social History Narrative   ** Merged History Encounter **       Social Determinants of Health   Financial Resource Strain: Not on file  Food Insecurity: Food Insecurity Present   Worried About Programme researcher, broadcasting/film/video in the Last Year: Sometimes true   Barista in the Last Year: Sometimes true  Transportation Needs: Unmet Transportation Needs   Lack of Transportation (Medical): Yes   Lack of Transportation (Non-Medical): Yes  Physical Activity: Not on file  Stress: Not on file  Social Connections: Not on file  Intimate Partner Violence: Not on file    Family History:  Family History  Problem Relation Age of Onset   Diabetes Mother    Hypertension Mother     Medications:   Current Outpatient Medications on File Prior to Visit  Medication Sig Dispense Refill  acetaminophen (TYLENOL) 325 MG tablet Take 2 tablets (650 mg total) by mouth every 4 (four) hours as needed for mild pain (or temp > 37.5 C (99.5 F)).     albuterol (VENTOLIN HFA) 108 (90 Base) MCG/ACT inhaler Inhale 1-2 puffs into the lungs every 6 (six) hours as needed for wheezing. 18 g 0   amLODipine (NORVASC) 10 MG tablet Take 1 tablet (10 mg total) by mouth daily. 30 tablet 3   aspirin 325 MG EC tablet Take 1 tablet (325 mg total) by mouth daily. 30 tablet 3   atorvastatin (LIPITOR) 80 MG tablet Take 1 tablet (80 mg total) by mouth daily. 30 tablet 3   baclofen (LIORESAL) 10 MG tablet Take 0.5 tablets (5 mg total) by mouth 3 (three) times daily  as needed for muscle spasms (shoulder pain/spasticity). 30 each 1   benzocaine (ORAJEL) 10 % mucosal gel Use as directed 1 application in the mouth or throat as needed for mouth pain. 5.3 g 0   benzonatate (TESSALON PERLES) 100 MG capsule Take 1 capsule (100 mg total) by mouth 3 (three) times daily as needed for cough. 20 capsule 0   cetirizine (ZYRTEC) 10 MG tablet Take 1 tablet (10 mg total) by mouth daily. 30 tablet 11   clopidogrel (PLAVIX) 75 MG tablet Take 1 tablet (75 mg total) by mouth daily. 30 tablet 1   lidocaine (LIDODERM) 5 % Place 2 patches onto the skin daily. Remove & Discard patch within 12 hours or as directed by MD 60 patch 0   lisinopril (ZESTRIL) 20 MG tablet Take 1 tablet (20 mg total) by mouth daily. 30 tablet 3   nicotine (NICODERM CQ - DOSED IN MG/24 HOURS) 14 mg/24hr patch Apply 1 patch (14 mg) daily x2 weeks 30 patch 3   pantoprazole (PROTONIX) 40 MG tablet Take 1 tablet (40 mg total) by mouth daily. 30 tablet 2   Vitamin D3 (VITAMIN D) 25 MCG tablet Take 1 tablet (1,000 Units total) by mouth daily. 30 tablet 3   [DISCONTINUED] lisinopril-hydrochlorothiazide (PRINZIDE,ZESTORETIC) 20-25 MG per tablet Take 1 tablet by mouth daily. 90 tablet 3   No current facility-administered medications on file prior to visit.    Allergies:   Allergies  Allergen Reactions   Morphine And Related Nausea And Vomiting      OBJECTIVE:  Physical Exam  There were no vitals filed for this visit. There is no height or weight on file to calculate BMI. No results found.  Depression screen PHQ 2/9 03/27/2021  Decreased Interest 1  Down, Depressed, Hopeless 2  PHQ - 2 Score 3  Altered sleeping 0  Tired, decreased energy 3  Change in appetite 0  Feeling bad or failure about yourself  3  Trouble concentrating 3  Moving slowly or fidgety/restless 3  Suicidal thoughts 0  PHQ-9 Score 15     General: well developed, well nourished, seated, in no evident distress Head: head  normocephalic and atraumatic.   Neck: supple with no carotid or supraclavicular bruits Cardiovascular: regular rate and rhythm, no murmurs Musculoskeletal: no deformity Skin:  no rash/petichiae Vascular:  Normal pulses all extremities   Neurologic Exam Mental Status: Awake and fully alert. Oriented to place and time. Recent and remote memory intact. Attention span, concentration and fund of knowledge appropriate. Mood and affect appropriate.  Cranial Nerves: Fundoscopic exam reveals sharp disc margins. Pupils equal, briskly reactive to light. Extraocular movements full without nystagmus. Visual fields full to confrontation. Hearing intact. Facial sensation intact.  Face, tongue, palate moves normally and symmetrically.  Motor: Normal bulk and tone. Normal strength in all tested extremity muscles Sensory.: intact to touch , pinprick , position and vibratory sensation.  Coordination: Rapid alternating movements normal in all extremities. Finger-to-nose and heel-to-shin performed accurately bilaterally. Gait and Station: Arises from chair without difficulty. Stance is normal. Gait demonstrates normal stride length and balance with ***. Tandem walk and heel toe ***.  Reflexes: 1+ and symmetric. Toes downgoing.     NIHSS  *** Modified Rankin  ***      ASSESSMENT: Charles Callahan is a 50 y.o. year old male with recent left MCA, MCA/PCA and MCA/PCA territory stroke likely due to left M1 high-grade stenosis on 02/27/2021. Vascular risk factors include uncontrolled HTN, HLD, intracranial stenosis and tobacco use.      PLAN:  *** : Residual deficit: ***. Continue {anticoagulants:31417}  and ***  for secondary stroke prevention.  Discussed secondary stroke prevention measures and importance of close PCP follow up for aggressive stroke risk factor management  HTN: BP goal <130/90.  Stable on *** per PCP HLD: LDL goal <70. Recent LDL ***.  DMII: A1c goal<7.0. Recent A1c ***.     Follow up in ***  or call earlier if needed   CC:  [facility] provider: Dr. Pearlean Brownie PCP: No primary care provider on file.    I spent *** minutes of face-to-face and non-face-to-face time with patient.  This included previsit chart review, lab review, study review, order entry, electronic health record documentation, patient education regarding recent stroke, residual deficits, importance of managing stroke risk factors and answered all questions to patient satisfaction     Ihor Austin, [facility]  [facility] [address] [place], [place] [address]  Phone [phone] Fax [phone] Note: This document was prepared with digital dictation and possible smart phrase technology. Any transcriptional errors that result from this process are unintentional.

## 2021-04-28 NOTE — Telephone Encounter (Signed)
04/28/21 pt no showed appt with Shanda Bumps, NP.

## 2021-04-29 ENCOUNTER — Ambulatory Visit: Payer: Self-pay | Admitting: Occupational Therapy

## 2021-05-01 ENCOUNTER — Encounter: Payer: Self-pay | Admitting: Occupational Therapy

## 2021-05-01 ENCOUNTER — Other Ambulatory Visit: Payer: Self-pay

## 2021-05-01 ENCOUNTER — Ambulatory Visit: Payer: Self-pay | Admitting: Occupational Therapy

## 2021-05-01 DIAGNOSIS — I69951 Hemiplegia and hemiparesis following unspecified cerebrovascular disease affecting right dominant side: Secondary | ICD-10-CM

## 2021-05-01 NOTE — Therapy (Unsigned)
[facility] [address] [place], [place], [address] Phone: [phone]   Fax:  [phone]  Patient Details  Name: Charles Callahan MRN: [id number] Date of Birth: [birth year] Referring Provider:  No ref. provider found  Encounter Date: 05/01/2021 Patient is being discharged from OT services due to multiple missed appointments without notification to office.  Calls made, and messages left without response.   Unable to comment on progress toward goals as patient has not been seen x several weeks.    Collier Salina 05/01/2021, 7:17 PM  [facility] [address] [place], [place], [address] Phone: [phone]   Fax:  [phone]

## 2021-05-06 ENCOUNTER — Ambulatory Visit: Payer: Self-pay | Admitting: Occupational Therapy

## 2021-05-08 ENCOUNTER — Encounter: Payer: Self-pay | Admitting: Occupational Therapy

## 2021-06-17 ENCOUNTER — Ambulatory Visit: Payer: Self-pay | Admitting: Critical Care Medicine

## 2021-06-28 IMAGING — DX DG CHEST 2V
2 series · 2 of 2 positions shown · non-contrast
Comparison: 03/04/2017

CLINICAL DATA: Chest pain for 1 hour

EXAM:
CHEST - 2 VIEW

[chest pa]
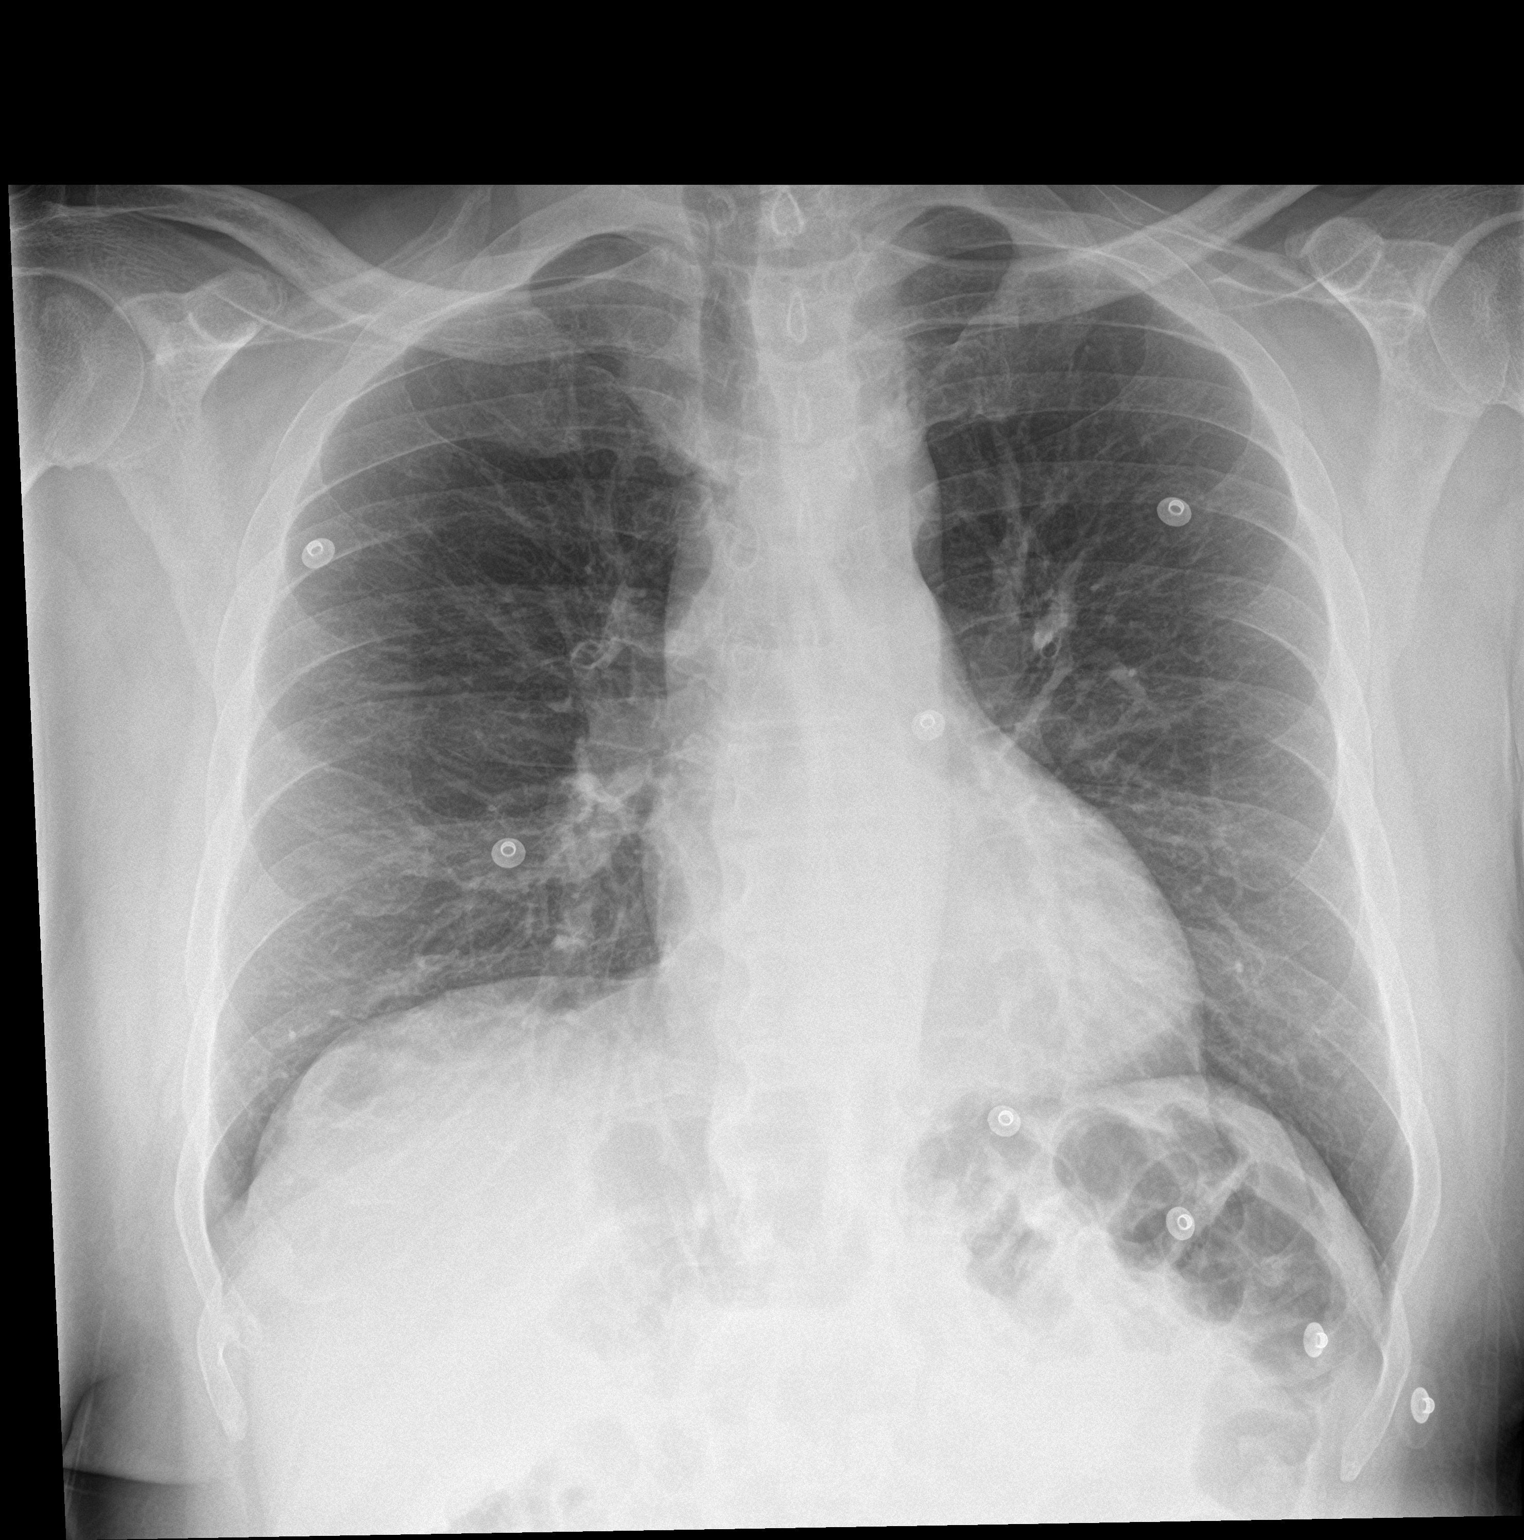

[chest lat]
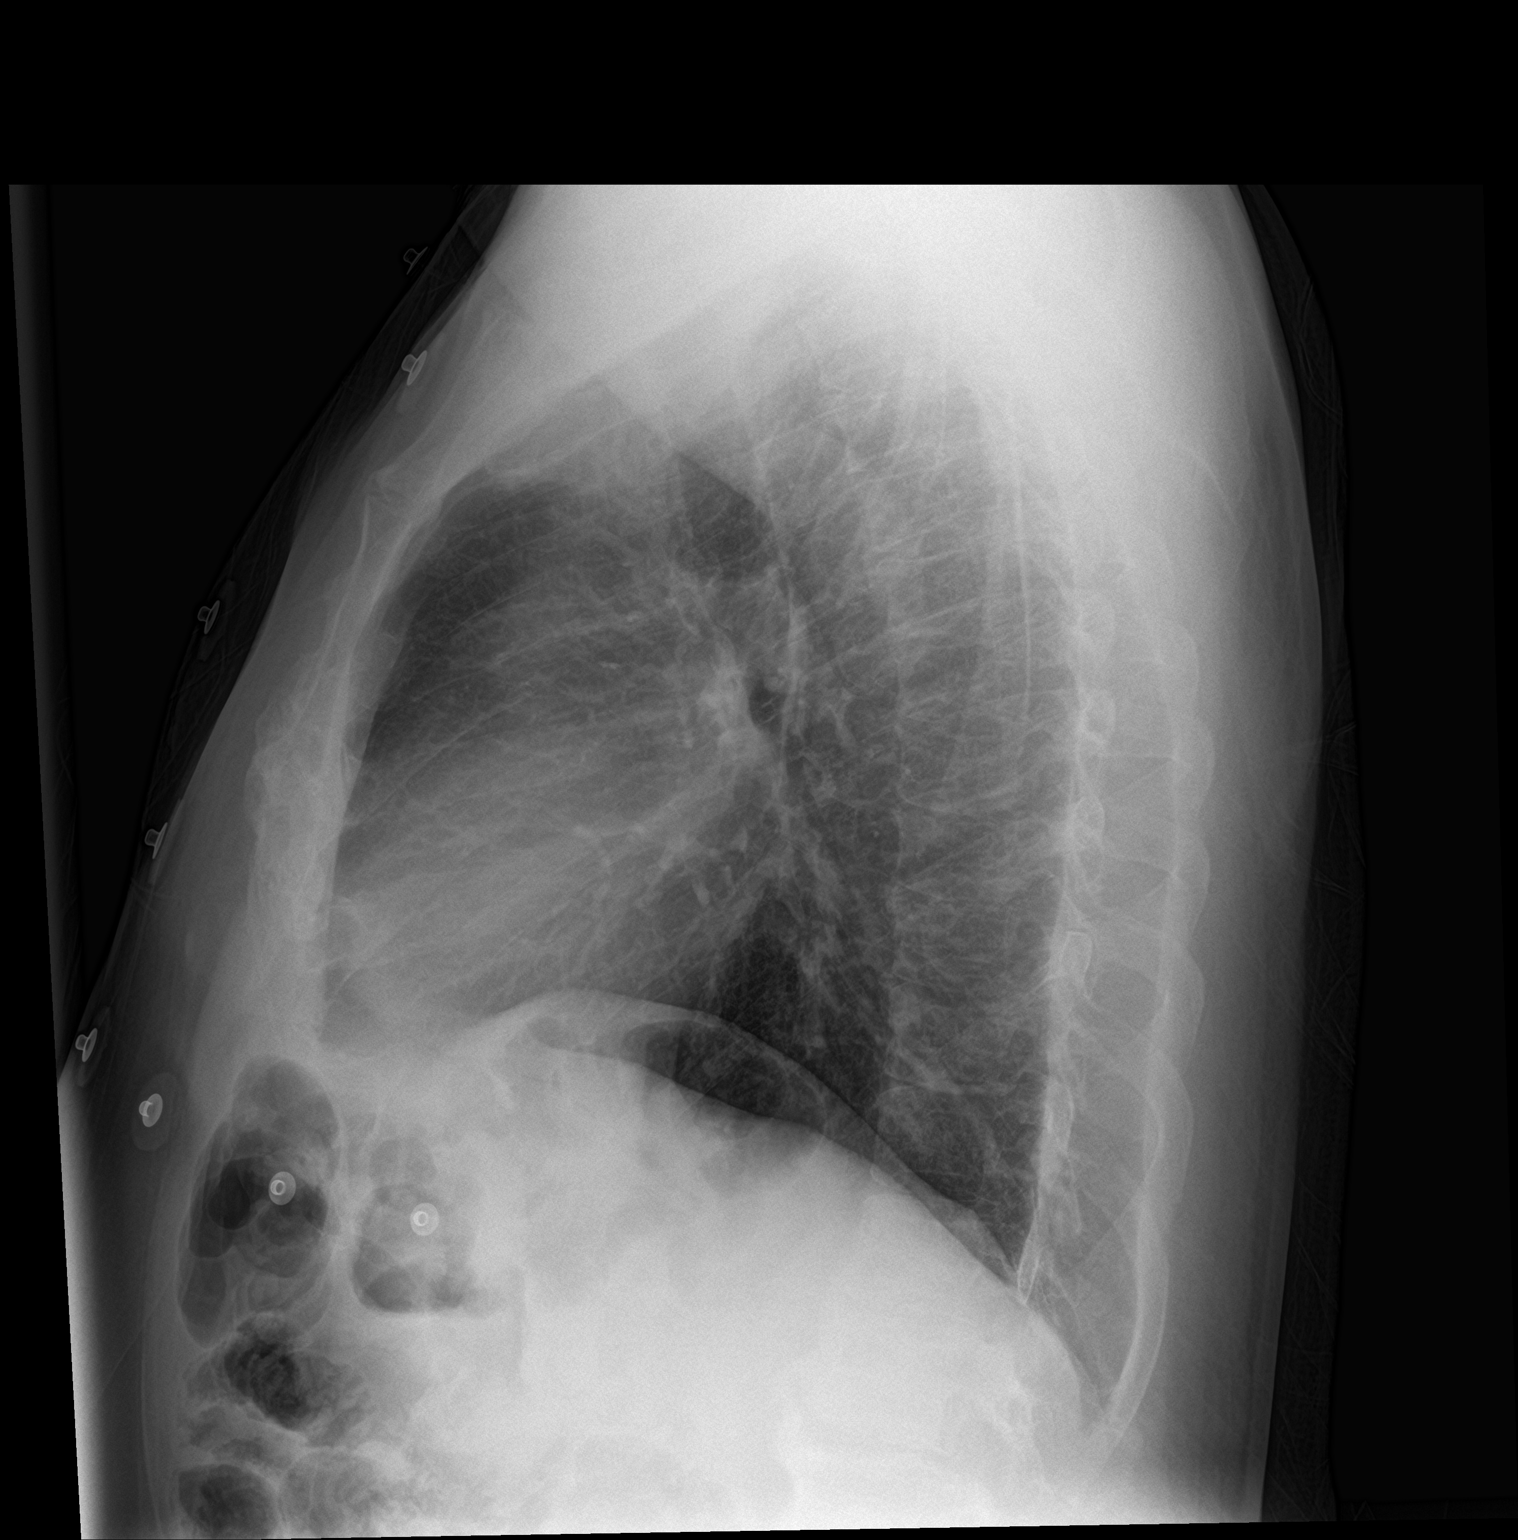

[2 of 2 positions shown; findings below may reference images not displayed]

FINDINGS: The heart size and mediastinal contours are within normal limits.
Both lungs are clear. The visualized skeletal structures are
unremarkable.
IMPRESSION: No active cardiopulmonary disease.

## 2021-07-10 IMAGING — US US THYROID
1 series · 13 of 25 positions shown · non-contrast
Comparison: None.

CLINICAL DATA: Palpable abnormality.  Palpable thyroid nodule.

EXAM:
THYROID ULTRASOUND
TECHNIQUE: Ultrasound examination of the thyroid gland and adjacent soft
tissues was performed.

[Series 1: us thyroid · 13 of 68 slices shown]
[im 1/68]
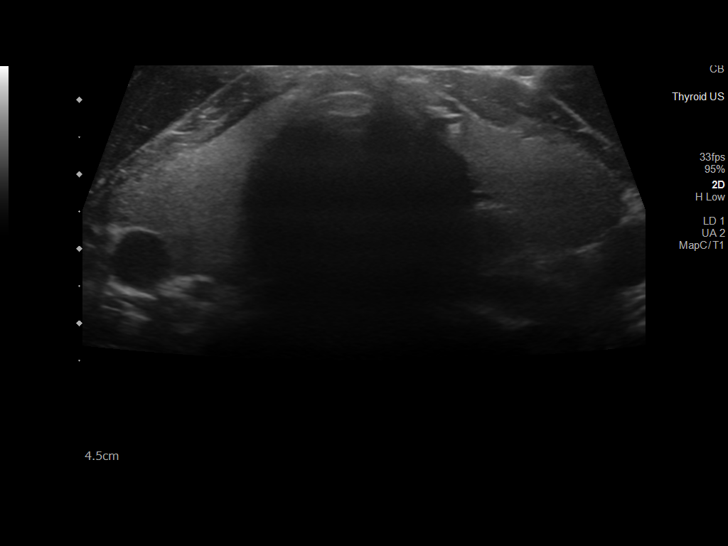
[im 6/68]
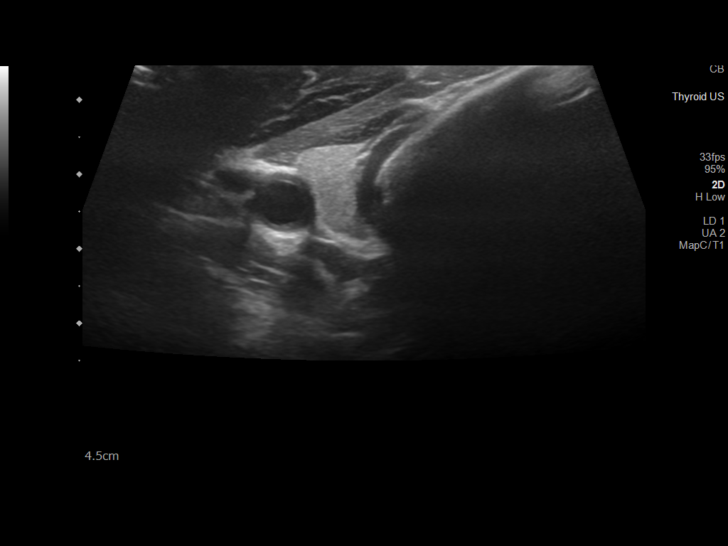
[im 12/68]
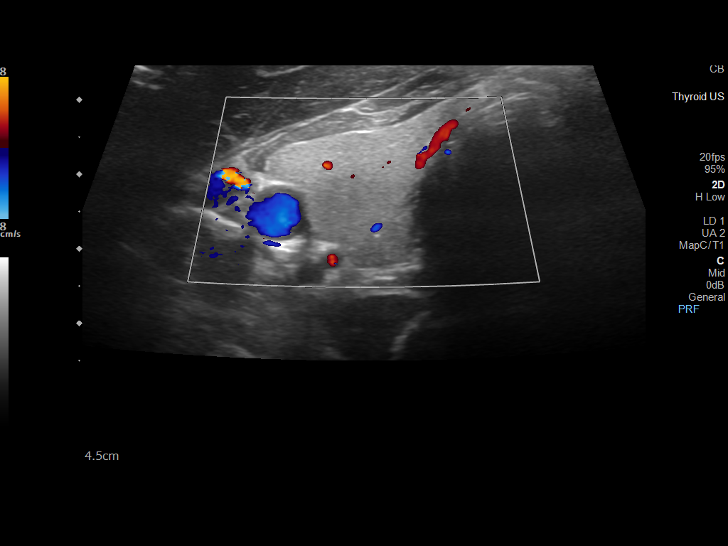
[im 17/68]
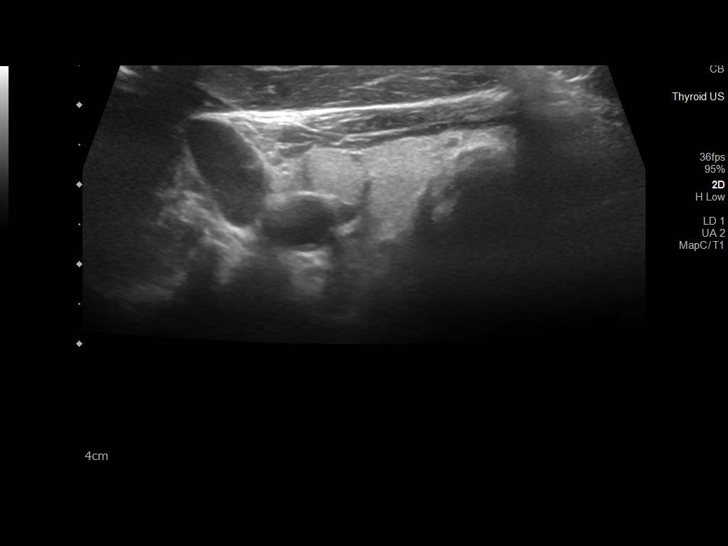
[im 23/68]
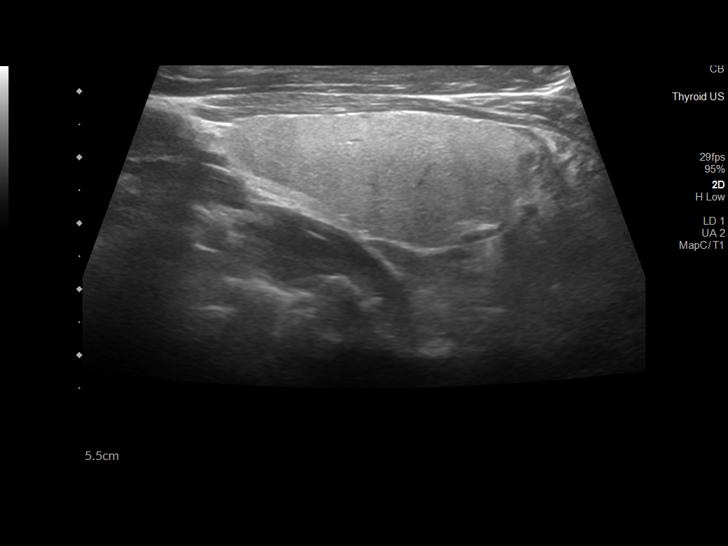
[im 28/68]
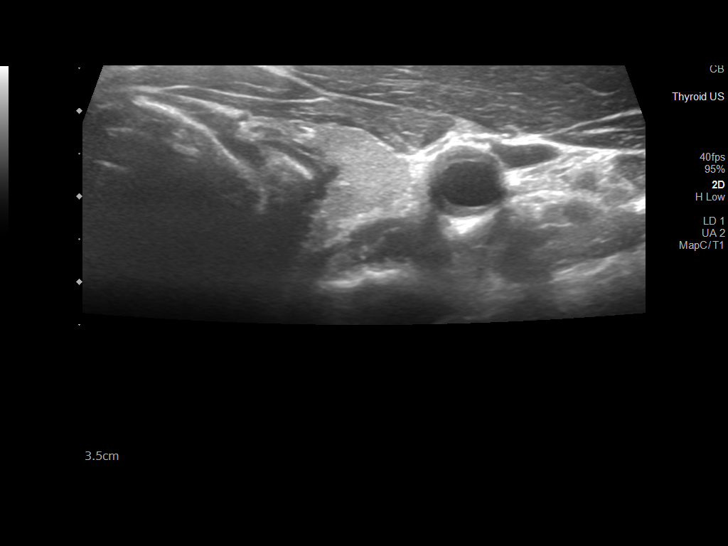
[im 34/68]
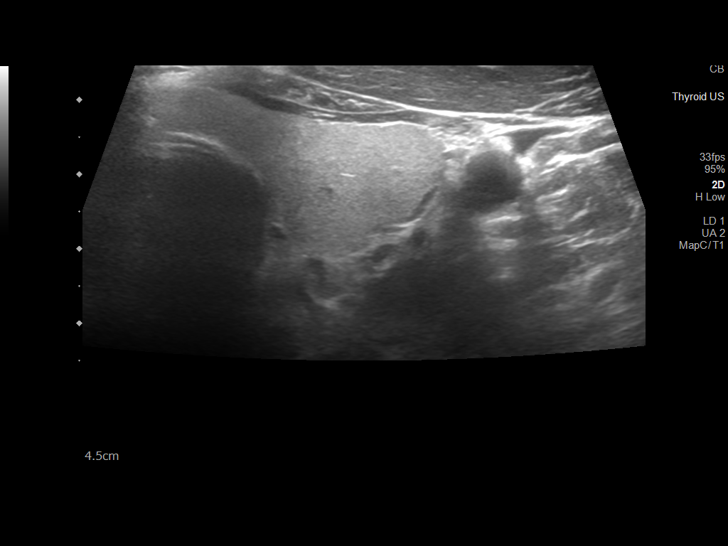
[im 40/68]
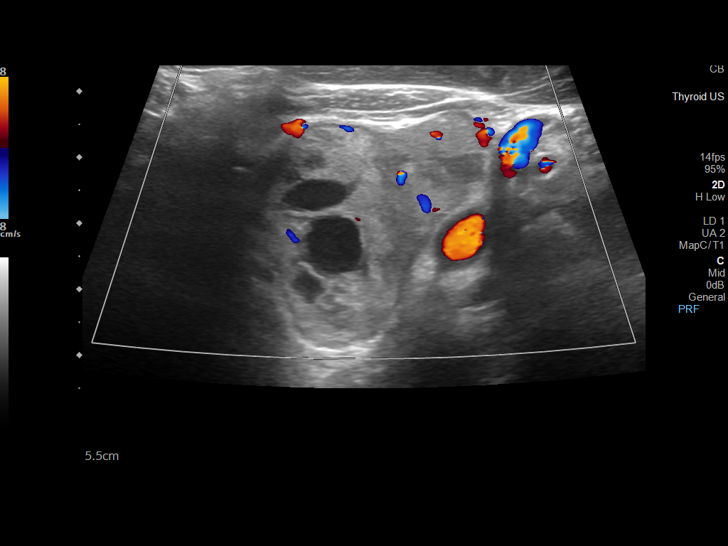
[im 45/68]
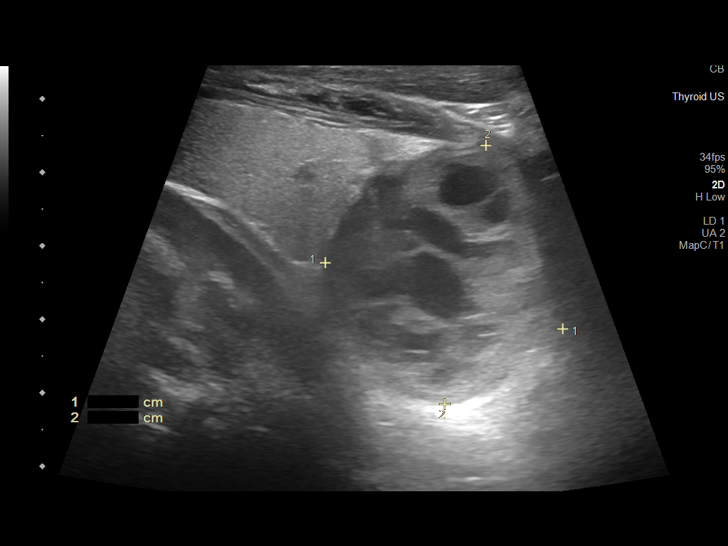
[im 51/68]
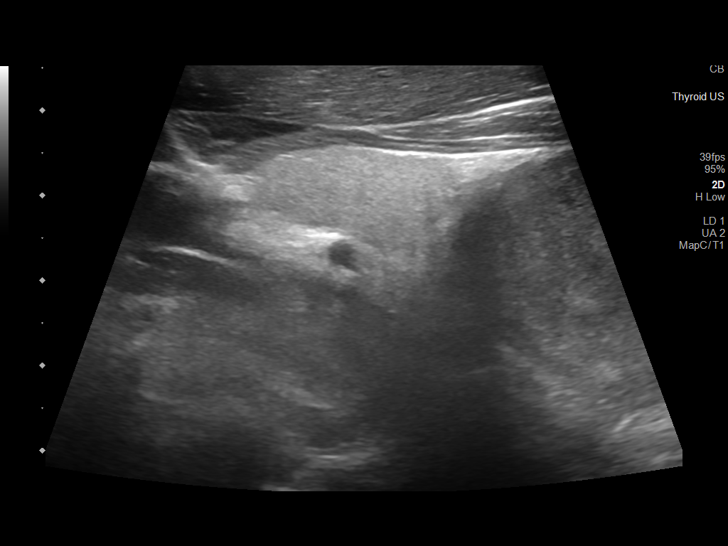
[im 56/68]
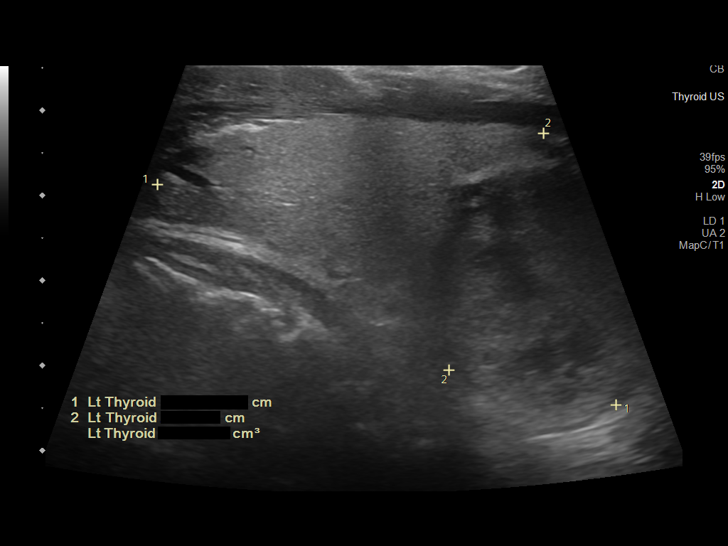
[im 62/68]
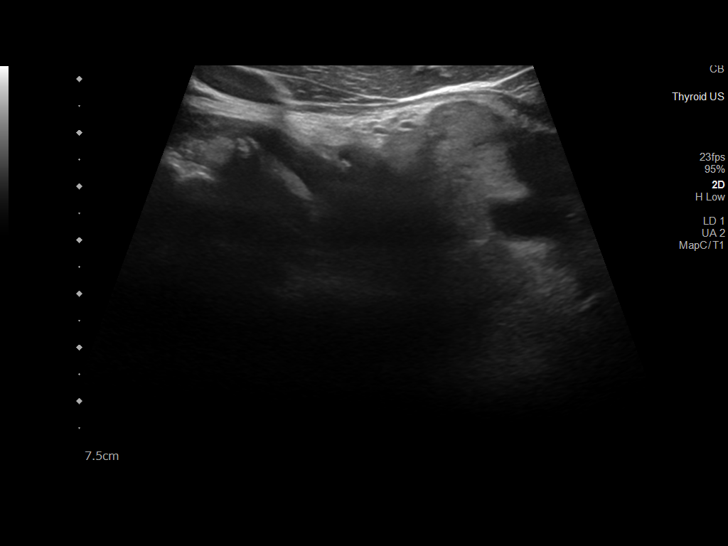
[im 68/68]
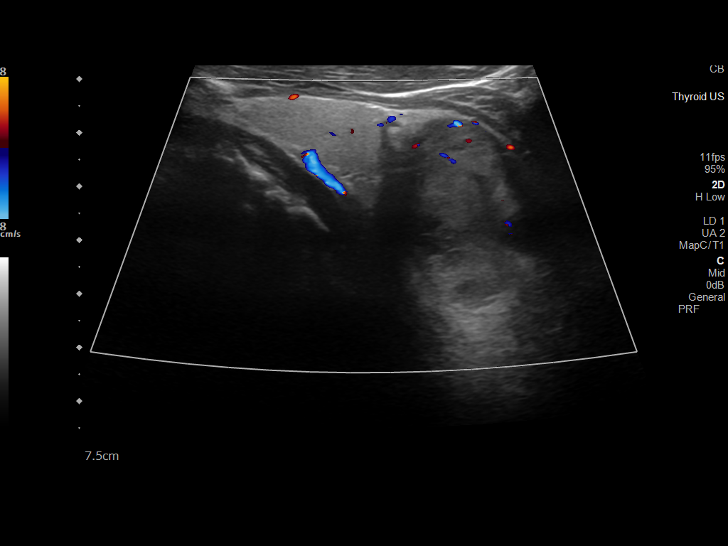

[13 of 25 positions shown; findings below may reference images not displayed]

FINDINGS: Parenchymal Echotexture: Normal

Isthmus: Normal in size measuring 0.2 cm in diameter

Right lobe: Normal in size measuring 5.3 x 2.0 x 2.2 cm

Left lobe: Enlarged measuring 6.0 x 3.0 x 2.6 cm

_________________________________________________________

Estimated total number of nodules >/= 1 cm: 1

Number of spongiform nodules >/=  2 cm not described below (TR1): 0

Number of mixed cystic and solid nodules >/= 1.5 cm not described
below (TR2): 0

_________________________________________________________

Nodule # 1:

Location: Left; Inferior

Maximum size: 3.7 cm; Other 2 dimensions: 3.5 x 3.4 cm

Composition: solid/almost completely solid (2)

Echogenicity: hypoechoic (2)

Shape: taller-than-wide (3)

Margins: lobulated/irregular (2)

Echogenic foci: none (0)

ACR TI-RADS total points: 9.

ACR TI-RADS risk category: TR5 (>/= 7 points).

ACR TI-RADS recommendations:

**Given size (>/= 1.0 cm) and appearance, fine needle aspiration of
this highly suspicious nodule should be considered based on TI-RADS
criteria.

_________________________________________________________
IMPRESSION: Solitary 3.7 cm TR5 (highly suspicious) right-sided thyroid nodule
meets imaging criteria to recommend percutaneous sampling.

The above is in keeping with the ACR TI-RADS recommendations - [HOSPITAL] 1072;[DATE].

## 2021-07-10 IMAGING — CT CT ANGIO HEAD-NECK (W OR W/O PERF)
2 of 11 series · 6 of 35 positions shown · IV contrast (OMNI 350)
Comparison: CT head 02/27/2021. Correlation made with MRI
02/27/2021

CLINICAL DATA: Right arm weakness and numbness, left cerebral
infarcts on MRI

EXAM:
CT ANGIOGRAPHY HEAD AND NECK
TECHNIQUE: Multidetector CT imaging of the head and neck was performed using
the standard protocol during bolus administration of intravenous
contrast. Multiplanar CT image reconstructions and MIPs were
obtained to evaluate the vascular anatomy. Carotid stenosis
measurements (when applicable) are obtained utilizing NASCET
criteria, using the distal internal carotid diameter as the
denominator.
CONTRAST:  50mL OMNIPAQUE IOHEXOL 350 MG/ML SOLN

[Series 11: cta neck axial · axial · 0.39mm/px · z∈[-243,-28]mm · 5 of 328 slices shown]
[im 55/328  soft-tissue]
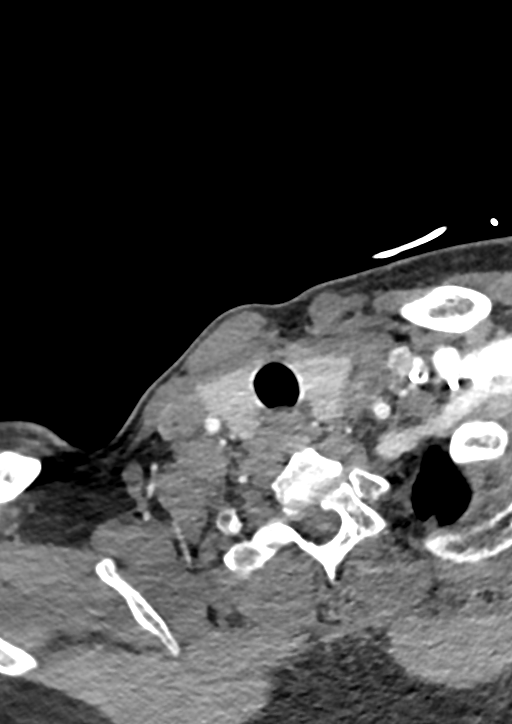
[im 110/328  bone]
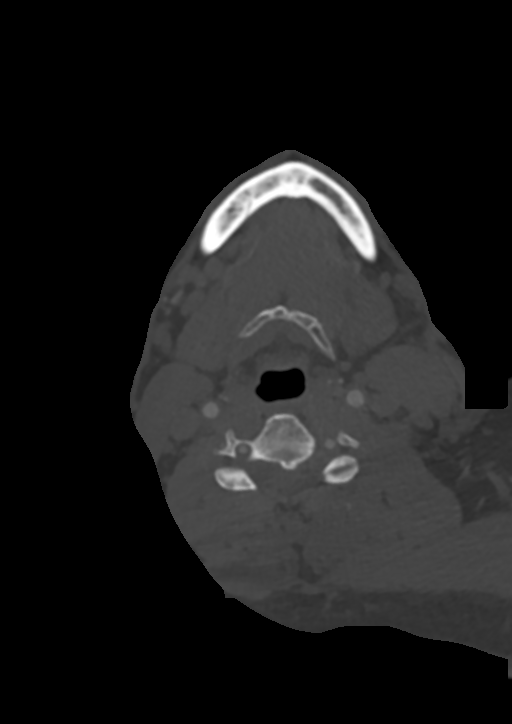
[im 164/328  soft-tissue]
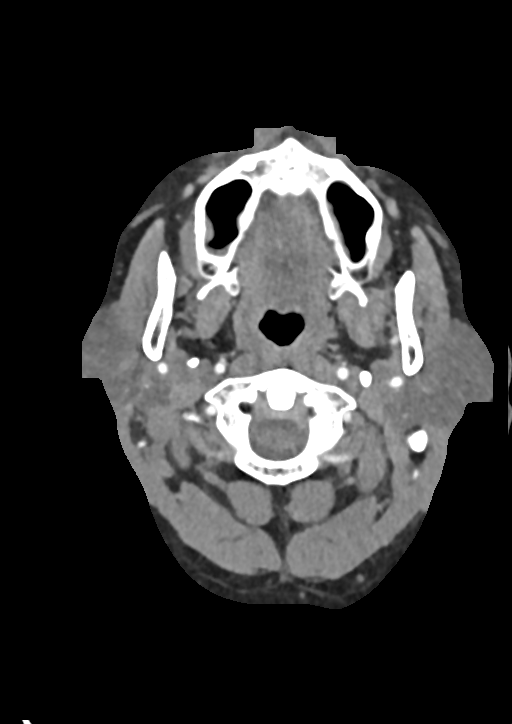
[im 219/328  bone]
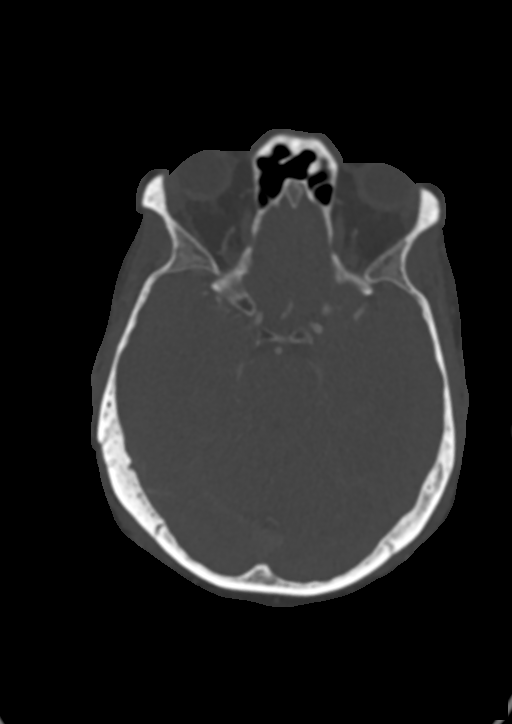
[im 273/328  soft-tissue]
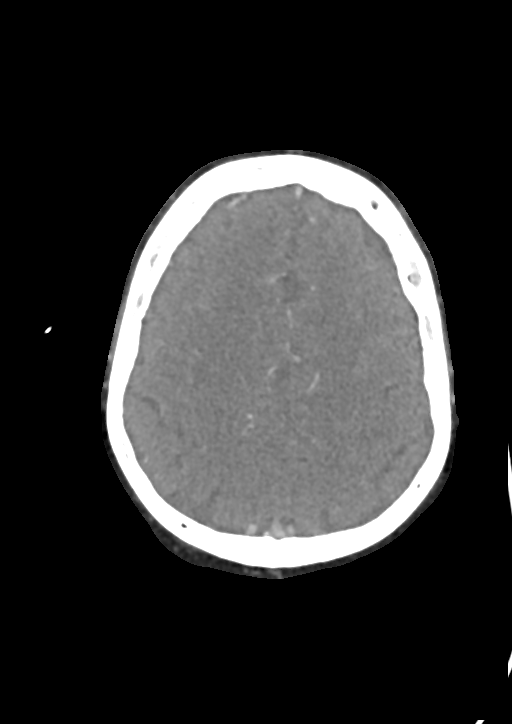

[Series 13: cta neck sagittal · sagittal · 0.49mm/px · 1 of 201 slices shown]
[im 58/201  soft-tissue]
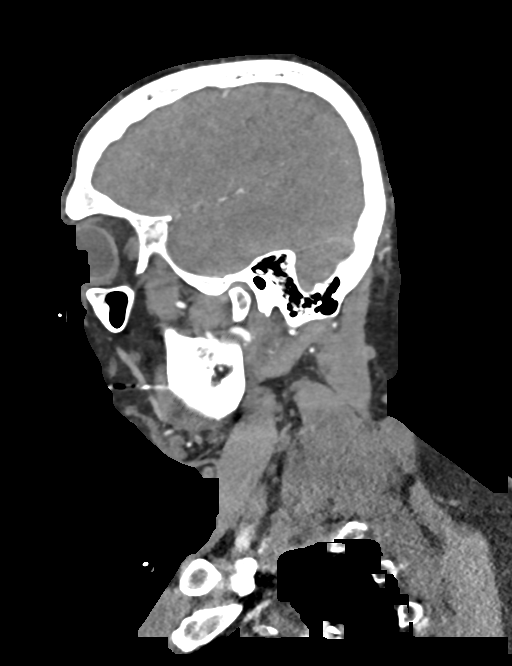

[6 of 35 positions shown; findings below may reference images not displayed]

FINDINGS: CT HEAD

Brain: Multiple small left cerebral hemisphere infarcts are better
seen on the prior MRI. There is no acute intracranial hemorrhage. No
new loss of gray-white differentiation. Stable findings of probable
chronic microvascular ischemic changes. No extra-axial fluid
collection.

Vascular: No new findings.

Skull: Calvarium is unremarkable.

Sinuses/Orbits: No acute finding.

Other: None.

Review of the MIP images confirms the above findings

CTA NECK

Aortic arch: Great vessel origins are patent.

Right carotid system: Patent.  No stenosis at the ICA origins.

Left carotid system: Patent.  No stenosis at the ICA origin.

Vertebral arteries: Patent and codominant.  No stenosis.

Skeleton: Mild degenerative changes of the cervical spine.

Other neck: As seen on recent chest CT, there is a 3-4 cm mass
abutting or arising from the left thyroid lobe.

Upper chest: Visualized lung apices are clear.

Review of the MIP images confirms the above findings

CTA HEAD

Anterior circulation: Intracranial internal carotid arteries are
patent with mild calcified plaque. Anterior cerebral arteries are
patent. Right A1 ACA is hypoplastic. Right middle cerebral artery is
patent. There is severe stenosis of the proximal to mid left M1 MCA.
Flow within the distal left MCA branches appears relatively
decreased compared to the right.

Posterior circulation: Intracranial vertebral arteries are patent.
Basilar artery is patent. Major cerebellar artery origins patent.
Posterior cerebral arteries are patent.

Venous sinuses: Patent as allowed by contrast bolus timing.

Review of the MIP images confirms the above findings
IMPRESSION: No acute intracranial hemorrhage. Small infarcts better seen on
prior MRI.

No hemodynamically significant stenosis in the neck.

Severe left M1 MCA stenosis with reduced flow in the distal left MCA
branches compared to the right.

3-4 cm mass abutting or arising from the left thyroid lobe.
Ultrasound is recommended for further evaluation.

## 2021-07-15 ENCOUNTER — Encounter: Payer: Self-pay | Attending: Physical Medicine and Rehabilitation | Admitting: Physical Medicine and Rehabilitation

## 2022-07-16 ENCOUNTER — Observation Stay (HOSPITAL_COMMUNITY)
Admission: EM | Admit: 2022-07-16 | Discharge: 2022-07-17 | Disposition: A | Discharge status: Home / Self Care | Payer: Medicaid Other | Attending: Internal Medicine | Admitting: Internal Medicine

## 2022-07-16 ENCOUNTER — Observation Stay (HOSPITAL_COMMUNITY): Payer: Medicaid Other

## 2022-07-16 ENCOUNTER — Other Ambulatory Visit: Payer: Self-pay

## 2022-07-16 ENCOUNTER — Encounter (HOSPITAL_COMMUNITY): Payer: Self-pay | Admitting: Emergency Medicine

## 2022-07-16 ENCOUNTER — Emergency Department (HOSPITAL_COMMUNITY): Payer: Medicaid Other

## 2022-07-16 DIAGNOSIS — Z79899 Other long term (current) drug therapy: Secondary | ICD-10-CM | POA: Diagnosis not present

## 2022-07-16 DIAGNOSIS — R531 Weakness: Secondary | ICD-10-CM | POA: Diagnosis present

## 2022-07-16 DIAGNOSIS — Z7902 Long term (current) use of antithrombotics/antiplatelets: Secondary | ICD-10-CM | POA: Diagnosis not present

## 2022-07-16 DIAGNOSIS — E7849 Other hyperlipidemia: Secondary | ICD-10-CM | POA: Diagnosis not present

## 2022-07-16 DIAGNOSIS — I110 Hypertensive heart disease with heart failure: Secondary | ICD-10-CM | POA: Diagnosis not present

## 2022-07-16 DIAGNOSIS — I6381 Other cerebral infarction due to occlusion or stenosis of small artery: Secondary | ICD-10-CM

## 2022-07-16 DIAGNOSIS — E785 Hyperlipidemia, unspecified: Secondary | ICD-10-CM | POA: Diagnosis present

## 2022-07-16 DIAGNOSIS — G459 Transient cerebral ischemic attack, unspecified: Secondary | ICD-10-CM

## 2022-07-16 DIAGNOSIS — I10 Essential (primary) hypertension: Secondary | ICD-10-CM | POA: Diagnosis present

## 2022-07-16 DIAGNOSIS — F17210 Nicotine dependence, cigarettes, uncomplicated: Secondary | ICD-10-CM | POA: Diagnosis not present

## 2022-07-16 DIAGNOSIS — R2990 Unspecified symptoms and signs involving the nervous system: Secondary | ICD-10-CM | POA: Diagnosis present

## 2022-07-16 DIAGNOSIS — E041 Nontoxic single thyroid nodule: Secondary | ICD-10-CM | POA: Insufficient documentation

## 2022-07-16 DIAGNOSIS — I639 Cerebral infarction, unspecified: Principal | ICD-10-CM | POA: Diagnosis present

## 2022-07-16 DIAGNOSIS — Z7982 Long term (current) use of aspirin: Secondary | ICD-10-CM | POA: Insufficient documentation

## 2022-07-16 DIAGNOSIS — I5030 Unspecified diastolic (congestive) heart failure: Secondary | ICD-10-CM | POA: Diagnosis not present

## 2022-07-16 DIAGNOSIS — Z20822 Contact with and (suspected) exposure to covid-19: Secondary | ICD-10-CM | POA: Insufficient documentation

## 2022-07-16 HISTORY — DX: Cerebral infarction, unspecified: I63.9

## 2022-07-16 LAB — RAPID URINE DRUG SCREEN, HOSP PERFORMED
Amphetamines: NOT DETECTED
Barbiturates: NOT DETECTED
Benzodiazepines: NOT DETECTED
Cocaine: NOT DETECTED
Opiates: NOT DETECTED
Tetrahydrocannabinol: NOT DETECTED

## 2022-07-16 LAB — DIFFERENTIAL
Abs Immature Granulocytes: 0.02 10*3/uL (ref 0.00–0.07)
Basophils Absolute: 0 10*3/uL (ref 0.0–0.1)
Basophils Relative: 1 %
Eosinophils Absolute: 0.1 10*3/uL (ref 0.0–0.5)
Eosinophils Relative: 2 %
Immature Granulocytes: 1 %
Lymphocytes Relative: 38 %
Lymphs Abs: 1.6 10*3/uL (ref 0.7–4.0)
Monocytes Absolute: 0.5 10*3/uL (ref 0.1–1.0)
Monocytes Relative: 13 %
Neutro Abs: 1.9 10*3/uL (ref 1.7–7.7)
Neutrophils Relative %: 45 %

## 2022-07-16 LAB — I-STAT CHEM 8, ED
BUN: 14 mg/dL (ref 6–20)
Calcium, Ion: 1.49 mmol/L — ABNORMAL HIGH (ref 1.15–1.40)
Chloride: 102 mmol/L (ref 98–111)
Creatinine, Ser: 1 mg/dL (ref 0.61–1.24)
Glucose, Bld: 95 mg/dL (ref 70–99)
HCT: 48 % (ref 39.0–52.0)
Hemoglobin: 16.3 g/dL (ref 13.0–17.0)
Potassium: 4.8 mmol/L (ref 3.5–5.1)
Sodium: 136 mmol/L (ref 135–145)
TCO2: 28 mmol/L (ref 22–32)

## 2022-07-16 LAB — CBC
HCT: 46.3 % (ref 39.0–52.0)
Hemoglobin: 15.8 g/dL (ref 13.0–17.0)
MCH: 31.5 pg (ref 26.0–34.0)
MCHC: 34.1 g/dL (ref 30.0–36.0)
MCV: 92.4 fL (ref 80.0–100.0)
Platelets: 272 10*3/uL (ref 150–400)
RBC: 5.01 MIL/uL (ref 4.22–5.81)
RDW: 13 % (ref 11.5–15.5)
WBC: 4.1 10*3/uL (ref 4.0–10.5)
nRBC: 0 % (ref 0.0–0.2)

## 2022-07-16 LAB — RESP PANEL BY RT-PCR (FLU A&B, COVID) ARPGX2
Influenza A by PCR: NEGATIVE
Influenza B by PCR: NEGATIVE
SARS Coronavirus 2 by RT PCR: NEGATIVE

## 2022-07-16 LAB — COMPREHENSIVE METABOLIC PANEL
ALT: 17 U/L (ref 0–44)
AST: 17 U/L (ref 15–41)
Albumin: 3.3 g/dL — ABNORMAL LOW (ref 3.5–5.0)
Alkaline Phosphatase: 74 U/L (ref 38–126)
Anion gap: 8 (ref 5–15)
BUN: 10 mg/dL (ref 6–20)
CO2: 25 mmol/L (ref 22–32)
Calcium: 11.2 mg/dL — ABNORMAL HIGH (ref 8.9–10.3)
Chloride: 103 mmol/L (ref 98–111)
Creatinine, Ser: 0.97 mg/dL (ref 0.61–1.24)
GFR, Estimated: >60 mL/min (ref >60)
Glucose, Bld: 100 mg/dL — ABNORMAL HIGH (ref 70–99)
Potassium: 4.3 mmol/L (ref 3.5–5.1)
Sodium: 136 mmol/L (ref 135–145)
Total Bilirubin: 0.6 mg/dL (ref 0.3–1.2)
Total Protein: 6.4 g/dL — ABNORMAL LOW (ref 6.5–8.1)

## 2022-07-16 LAB — ETHANOL: Alcohol, Ethyl (B): <10 mg/dL (ref <10)

## 2022-07-16 LAB — URINALYSIS, ROUTINE W REFLEX MICROSCOPIC
Bilirubin Urine: NEGATIVE
Glucose, UA: NEGATIVE mg/dL
Hgb urine dipstick: NEGATIVE
Ketones, ur: NEGATIVE mg/dL
Leukocytes,Ua: NEGATIVE
Nitrite: NEGATIVE
Protein, ur: NEGATIVE mg/dL
Specific Gravity, Urine: 1.011 (ref 1.005–1.030)
pH: 6 (ref 5.0–8.0)

## 2022-07-16 LAB — APTT: aPTT: 35 seconds (ref 24–36)

## 2022-07-16 LAB — PROTIME-INR
INR: 1 (ref 0.8–1.2)
Prothrombin Time: 13.2 s (ref 11.4–15.2)

## 2022-07-16 LAB — CBG MONITORING, ED: Glucose-Capillary: 89 mg/dL (ref 70–99)

## 2022-07-16 MED ORDER — LABETALOL HCL 5 MG/ML IV SOLN
10.0000 mg | Freq: Once | INTRAVENOUS | Status: AC
  Administered 2022-07-16: 10 mg via INTRAVENOUS

## 2022-07-16 MED ORDER — LORAZEPAM 2 MG/ML IJ SOLN
0.5000 mg | Freq: Four times a day (QID) | INTRAMUSCULAR | Status: DC | PRN
  Administered 2022-07-16: 0.5 mg via INTRAVENOUS
  Filled 2022-07-16: qty 1

## 2022-07-16 MED ORDER — SODIUM CHLORIDE 0.9 % IV BOLUS
1000.0000 mL | Freq: Once | INTRAVENOUS | Status: AC
  Administered 2022-07-16: 1000 mL via INTRAVENOUS

## 2022-07-16 MED ORDER — ASPIRIN 81 MG PO TBEC
81.0000 mg | DELAYED_RELEASE_TABLET | Freq: Every day | ORAL | Status: DC
  Administered 2022-07-17: 81 mg via ORAL
  Filled 2022-07-16: qty 1

## 2022-07-16 MED ORDER — ACETAMINOPHEN 325 MG PO TABS: 650.0000 mg | ORAL_TABLET | Freq: Four times a day (QID) | ORAL | Status: DC | PRN

## 2022-07-16 MED ORDER — ENOXAPARIN SODIUM 40 MG/0.4ML IJ SOSY
40.0000 mg | PREFILLED_SYRINGE | INTRAMUSCULAR | Status: DC
  Administered 2022-07-16: 40 mg via SUBCUTANEOUS
  Filled 2022-07-16: qty 0.4

## 2022-07-16 MED ORDER — GADOPICLENOL 0.5 MMOL/ML IV SOLN
9.5000 mL | Freq: Once | INTRAVENOUS | Status: AC | PRN
  Administered 2022-07-16: 9.5 mL via INTRAVENOUS

## 2022-07-16 MED ORDER — TENECTEPLASE FOR STROKE
0.2500 mg/kg | PACK | Freq: Once | INTRAVENOUS | Status: DC
  Filled 2022-07-16: qty 10

## 2022-07-16 MED ORDER — POLYETHYLENE GLYCOL 3350 17 G PO PACK: 17.0000 g | PACK | Freq: Every day | ORAL | Status: DC | PRN

## 2022-07-16 MED ORDER — ATORVASTATIN CALCIUM 80 MG PO TABS
80.0000 mg | ORAL_TABLET | Freq: Every day | ORAL | Status: DC
  Administered 2022-07-16 – 2022-07-17 (×2): 80 mg via ORAL
  Filled 2022-07-16: qty 1
  Filled 2022-07-16: qty 2

## 2022-07-16 MED ORDER — CLOPIDOGREL BISULFATE 75 MG PO TABS
75.0000 mg | ORAL_TABLET | Freq: Every day | ORAL | Status: DC
  Administered 2022-07-16 – 2022-07-17 (×2): 75 mg via ORAL
  Filled 2022-07-16 (×2): qty 1

## 2022-07-16 MED ORDER — ASPIRIN 325 MG PO TABS
325.0000 mg | ORAL_TABLET | Freq: Every day | ORAL | Status: DC
  Administered 2022-07-16: 325 mg via ORAL
  Filled 2022-07-16: qty 1

## 2022-07-16 MED ORDER — ACETAMINOPHEN 650 MG RE SUPP: 650.0000 mg | Freq: Four times a day (QID) | RECTAL | Status: DC | PRN

## 2022-07-16 MED ORDER — NICOTINE 14 MG/24HR TD PT24
14.0000 mg | MEDICATED_PATCH | Freq: Every day | TRANSDERMAL | Status: DC
  Filled 2022-07-16: qty 1

## 2022-07-16 MED ORDER — PANTOPRAZOLE SODIUM 40 MG PO TBEC
40.0000 mg | DELAYED_RELEASE_TABLET | Freq: Every day | ORAL | Status: DC
  Administered 2022-07-16 – 2022-07-17 (×2): 40 mg via ORAL
  Filled 2022-07-16 (×2): qty 1

## 2022-07-16 NOTE — ED Provider Notes (Signed)
[facility] EMERGENCY DEPARTMENT Provider Note   CSN: [id number] Arrival date & time: 07/16/22  1318  An emergency department physician performed an initial assessment on this suspected stroke patient at 23.  History  Chief Complaint  Patient presents with   Code Stroke    Charles Callahan is a 51 y.o. male.  Patient has a history of hypertension and stroke.  Patient does not have any residual symptoms from the stroke.  He was outside and all of a sudden his left arm and left leg became weak.  Patient was transported to the emergency department and a code stroke was called.  By the time neurology saw the patient his symptoms had completely resolved  The history is provided by the patient and medical records. No language interpreter was used.  Weakness Severity:  Moderate Onset quality:  Sudden Timing:  Intermittent Chronicity:  New Context: not alcohol use   Relieved by:  Nothing Worsened by:  Nothing Ineffective treatments:  None tried Associated symptoms: no abdominal pain, no chest pain, no cough, no diarrhea, no frequency, no headaches and no seizures        Home Medications Prior to Admission medications   Medication Sig Start Date End Date Taking? Authorizing Provider  acetaminophen (TYLENOL) 325 MG tablet Take 2 tablets (650 mg total) by mouth every 4 (four) hours as needed for mild pain (or temp > 37.5 C (99.5 F)). 03/13/21   Angiulli, Lavon Paganini, PA-C  albuterol (VENTOLIN HFA) 108 (90 Base) MCG/ACT inhaler Inhale 1-2 puffs into the lungs every 6 (six) hours as needed for wheezing. 04/16/21   Argentina Donovan, PA-C  amLODipine (NORVASC) 10 MG tablet Take 1 tablet (10 mg total) by mouth daily. 04/16/21   Argentina Donovan, PA-C  aspirin 325 MG EC tablet Take 1 tablet (325 mg total) by mouth daily. 04/16/21   Argentina Donovan, PA-C  atorvastatin (LIPITOR) 80 MG tablet Take 1 tablet (80 mg total) by mouth daily. 04/16/21   Argentina Donovan, PA-C  baclofen  (LIORESAL) 10 MG tablet Take 0.5 tablets (5 mg total) by mouth 3 (three) times daily as needed for muscle spasms (shoulder pain/spasticity). 04/16/21   Argentina Donovan, PA-C  benzocaine (ORAJEL) 10 % mucosal gel Use as directed 1 application in the mouth or throat as needed for mouth pain. 03/27/21   Raulkar, Clide Deutscher, MD  benzonatate (TESSALON PERLES) 100 MG capsule Take 1 capsule (100 mg total) by mouth 3 (three) times daily as needed for cough. 03/27/21   Raulkar, Clide Deutscher, MD  cetirizine (ZYRTEC) 10 MG tablet Take 1 tablet (10 mg total) by mouth daily. 04/16/21   Argentina Donovan, PA-C  clopidogrel (PLAVIX) 75 MG tablet Take 1 tablet (75 mg total) by mouth daily. 04/16/21   Argentina Donovan, PA-C  lidocaine (LIDODERM) 5 % Place 2 patches onto the skin daily. Remove & Discard patch within 12 hours or as directed by MD 03/27/21   Raulkar, Clide Deutscher, MD  lisinopril (ZESTRIL) 20 MG tablet Take 1 tablet (20 mg total) by mouth daily. 04/16/21   Argentina Donovan, PA-C  nicotine (NICODERM CQ - DOSED IN MG/24 HOURS) 14 mg/24hr patch Apply 1 patch (14 mg) daily x2 weeks 03/27/21   Raulkar, Clide Deutscher, MD  pantoprazole (PROTONIX) 40 MG tablet Take 1 tablet (40 mg total) by mouth daily. 04/16/21   Argentina Donovan, PA-C  Vitamin D3 (VITAMIN D) 25 MCG tablet Take 1 tablet (1,000 Units  total) by mouth daily. 04/16/21   Argentina Donovan, PA-C  lisinopril-hydrochlorothiazide (PRINZIDE,ZESTORETIC) 20-25 MG per tablet Take 1 tablet by mouth daily. 06/14/15 02/16/21  Micheline Chapman, NP      Allergies    Morphine and related    Review of Systems   Review of Systems  Constitutional:  Negative for appetite change and fatigue.  HENT:  Negative for congestion, ear discharge and sinus pressure.   Eyes:  Negative for discharge.  Respiratory:  Negative for cough.   Cardiovascular:  Negative for chest pain.  Gastrointestinal:  Negative for abdominal pain and diarrhea.  Genitourinary:  Negative for frequency and  hematuria.  Musculoskeletal:  Negative for back pain.  Skin:  Negative for rash.  Neurological:  Positive for weakness. Negative for seizures and headaches.       Weakness left arm and left leg that has resolved now  Psychiatric/Behavioral:  Negative for hallucinations.     Physical Exam Updated Vital Signs BP (!) 156/133   Pulse (!) 55   Temp 98.5 F (36.9 C) (Oral)   Resp 20   Ht 5\' 6"  (1.676 m)   Wt 95.2 kg   SpO2 100%   BMI 33.88 kg/m  Physical Exam Vitals and nursing note reviewed.  Constitutional:      Appearance: He is well-developed.  HENT:     Head: Normocephalic.     Nose: Nose normal.  Eyes:     General: No scleral icterus.    Conjunctiva/sclera: Conjunctivae normal.  Neck:     Thyroid: No thyromegaly.  Cardiovascular:     Rate and Rhythm: Normal rate and regular rhythm.     Heart sounds: No murmur heard.    No friction rub. No gallop.  Pulmonary:     Breath sounds: No stridor. No wheezing or rales.  Chest:     Chest wall: No tenderness.  Abdominal:     General: There is no distension.     Tenderness: There is no abdominal tenderness. There is no rebound.  Musculoskeletal:        General: Normal range of motion.     Cervical back: Neck supple.  Lymphadenopathy:     Cervical: No cervical adenopathy.  Skin:    Findings: No erythema or rash.  Neurological:     Mental Status: He is alert and oriented to person, place, and time.     Motor: No abnormal muscle tone.     Coordination: Coordination normal.  Psychiatric:        Behavior: Behavior normal.     ED Results / Procedures / Treatments   Labs (all labs ordered are listed, but only abnormal results are displayed) Labs Reviewed  I-STAT CHEM 8, ED - Abnormal; Notable for the following components:      Result Value   Calcium, Ion 1.49 (*)    All other components within normal limits  RESP PANEL BY RT-PCR (FLU A&B, COVID) ARPGX2  ETHANOL  CBC  DIFFERENTIAL  RAPID URINE DRUG SCREEN, HOSP  PERFORMED  URINALYSIS, ROUTINE W REFLEX MICROSCOPIC  PROTIME-INR  APTT  COMPREHENSIVE METABOLIC PANEL  CBG MONITORING, ED    EKG None  Radiology CT HEAD CODE STROKE WO CONTRAST  Result Date: 07/16/2022 CLINICAL DATA:  Code stroke. EXAM: CT HEAD WITHOUT CONTRAST TECHNIQUE: Contiguous axial images were obtained from the base of the skull through the vertex without intravenous contrast. RADIATION DOSE REDUCTION: This exam was performed according to the departmental dose-optimization program which includes automated exposure control,  adjustment of the mA and/or kV according to patient size and/or use of iterative reconstruction technique. COMPARISON:  CT head Feb 28, 2021. FINDINGS: Brain: New areas of hypoattenuation and loss of gray differentiation in the left parietal and left temporal lobes. No evidence of acute hemorrhage. No mass lesion, midline shift or hydrocephalus. Patchy white matter hypodensities, nonspecific but compatible with chronic microvascular disease. Vascular: No hyperdense vessel identified. Skull: No acute fracture. Sinuses/Orbits: No acute findings. Other: No mastoid effusions. IMPRESSION: 1. Age indeterminate infarcts in the left temporal and parietal lobes, new since Feb 28, 2021 but favored chronic given appearance. MRI could further evaluate. 2. No acute hemorrhage. Code stroke imaging results were communicated on 07/16/2022 at 1:39 pm to provider 1:32 via telephone, who verbally acknowledged these results. Electronically Signed   By: Margaretha Sheffield M.D.   On: 07/16/2022 13:41    Procedures Procedures    Medications Ordered in ED Medications  clopidogrel (PLAVIX) tablet 75 mg (has no administration in time range)  LORazepam (ATIVAN) injection 0.5 mg (has no administration in time range)  labetalol (NORMODYNE) injection 10 mg (10 mg Intravenous Given 07/16/22 1331)    ED Course/ Medical Decision Making/ A&P  CRITICAL CARE Performed by: Milton Ferguson Total  critical care time: 35 minutes Critical care time was exclusive of separately billable procedures and treating other patients. Critical care was necessary to treat or prevent imminent or life-threatening deterioration. Critical care was time spent personally by me on the following activities: development of treatment plan with patient and/or surrogate as well as nursing, discussions with consultants, evaluation of patient's response to treatment, examination of patient, obtaining history from patient or surrogate, ordering and performing treatments and interventions, ordering and review of laboratory studies, ordering and review of radiographic studies, pulse oximetry and re-evaluation of patient's condition.                             Patient was seen by neurology.  His symptoms had completely resolved by the time neurology saw him.  Patient will be admitted for stroke work-up but the neurologist felt like he needed to stay in the emergency department until 5 PM.  He can get TNK up till 5 PM if his symptoms return Medical Decision Making Amount and/or Complexity of Data Reviewed Labs: ordered. Radiology: ordered.  Risk Prescription drug management. Decision regarding hospitalization.  This patient presents to the ED for concern of weakness, this involves an extensive number of treatment options, and is a complaint that carries with it a high risk of complications and morbidity.  The differential diagnosis includes stroke, tumor   Co morbidities that complicate the patient evaluation  History of stroke   Additional history obtained:  Additional history obtained from the patient External records from outside source obtained and reviewed including hospital records   Lab Tests:  I Ordered, and personally interpreted labs.  The pertinent results include: CBC chemistries unremarkable   Imaging Studies ordered:  I ordered imaging studies including MRI I independently visualized and  interpreted imaging which showed acute stroke I agree with the radiologist interpretation   Cardiac Monitoring: / EKG:  The patient was maintained on a cardiac monitor.  I personally viewed and interpreted the cardiac monitored which showed an underlying rhythm of: Normal sinus rhythm   Consultations Obtained:  I requested consultation with the neurology,  and discussed lab and imaging findings as well as pertinent plan - they recommend: Admit  Problem List / ED Course / Critical interventions / Medication management  Hypertension and stroke No medicine Reevaluation of the patient after these medicines showed that the patient improved I have reviewed the patients home medicines and have made adjustments as needed   Social Determinants of Health:  None   Test / Admission - Considered:  Patient will be admitted to the hospital for further work-up  Patient with acute stroke is admitted to medicine with neuro consult        Final Clinical Impression(s) / ED Diagnoses Final diagnoses:  None    Rx / DC Orders ED Discharge Orders     None         Milton Ferguson, MD 07/17/22 1657

## 2022-07-16 NOTE — Progress Notes (Signed)
Notified of new left sided numbness of his mouth, arm and leg. On my evaluation, NIHSS is still 0. No objective numbness. Evaluated again after 30 mins and NIHSS still at 0.  [facility] Pager Number [phone]

## 2022-07-16 NOTE — Progress Notes (Signed)
PHARMACIST CODE STROKE RESPONSE  Notified to mix TNK at 13:37 by Dr. Lorrin Goodell TNK preparation completed at 13:39  TNK dose = 24 mg IV over 5 seconds.   Issues/delays encountered (if applicable): TNK NOT given once mixed due to rapidly improving symptoms.   Luisa Hart, PharmD, BCPS Clinical Pharmacist 07/16/2022 1:50 PM

## 2022-07-16 NOTE — ED Notes (Signed)
Got patient into a gown on the monitor did ekg shown to Dr Roderic Palau patient is resting with call bell in reach

## 2022-07-16 NOTE — H&P (Addendum)
Date: 07/16/2022               Patient Name:  KAIEN PEZZULLO MRN: [id number]  DOB: [birth year] Age / Sex: 51 y.o., male   PCP: Pcp, No         Medical Service: Internal Medicine Teaching Service         Attending Physician: Dr. Lucious Groves, DO    First Contact: Roswell Nickel, MD      Pager: [phone]      Second Contact: Lorine Bears      Pager: Tenny Craw           After Hours (After 5p/  First Contact Pager: [phone]  weekends / holidays): Second Contact Pager: [phone]   SUBJECTIVE   Chief Complaint: Left-sided weakness   History of Present Illness: Mr Zunker is a 51 year old male with a past medical history of diastolic CHF, hypertension, GERD, tobacco use, and prior stroke resulting in mild right-sided weakness who presents with sudden-onset left-sided weakness.  Patient states that the left side of his body suddenly became weak earlier today 9-28 when he was walking to the bus around 13:00, causing him to fall. He did not hit his head or lose consciousness. His niece was present and immediately called EMS. The left-sided weakness was accompanied by facial droop and numbness involving the left side of his body that seems to travel from his mouth to foot. Since arriving at the [facility] ED, these symptoms have occurred intermittently in self-resolving episodes. During initial encounter with the treatment team, he denied feeling either weakness or numbness.  Medical history is significant for a prior stroke sustained in 02-2021 that resulted in mild right-sided weakness. He was prescribed medications after that hospitalization, but discontinued them shortly after discharge due to lack of insurance coverage. He has not taken any medications within the last year.  Of note, he reports a long history of high calcium levels and says that his mother also had high calcium. He reports chronic constipation. Denies any over-the-counter supplementation including vitamin D, calcium  tablets, or Tums.    ED Course: Upon arrival to the ED, vitals notable for BP 172/127. Patient was still experiencing left-sided weakness. Head CT negative for large territory infarction or hemorrhage. Although within the thrombolytic window, his symptoms improved before administration and so it was withheld. Laboratory testing notable for calcium 11.2. Labetalol, clopidogrel, and aspirin were administered. Brain MRI and MRI-angiography are pending.   Meds:  No outpatient medications have been marked as taking for the 07/16/22 encounter [facility] Encounter).    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    OBJECTIVE:   Physical Exam: Blood pressure (!) 182/104, pulse (!) 59, temperature 98.5 F (36.9 C), temperature source Oral, resp. rate 19, height '5\' 6"'  (  1.676 m), weight 95.2 kg, SpO2 98 %.   General:      awake and alert, lying comfortably in bed, cooperative, not in acute distress Head:      normocephalic and atraumatic, oral mucosa moist Eyes:      extraocular movements intact, conjunctivae pink Lungs:      normal respiratory effort, breathing unlabored, symmetrical chest rise, no crackles or wheezing Cardiac:      regular rate and rhythm, normal S1 and S2,  no pitting edema Musculoskeletal:  full range of motion in joints, motor strength 5- /5 in all four extremities including grip Neurologic:      oriented to person-place-time, sensation to light touch symmetric in upper and lower extremities, CN II through XII grossly intact Psychiatric:      mood and affect normal, intelligible speech   Labs: CBC    Component Value Date/Time   WBC 4.1 07/16/2022 1330   RBC 5.01 07/16/2022 1330   HGB 16.3 07/16/2022 1332   HCT 48.0 07/16/2022 1332   PLT 272 07/16/2022 1330   MCV 92.4 07/16/2022 1330   MCH 31.5 07/16/2022 1330   MCHC 34.1 07/16/2022 1330   RDW 13.0 07/16/2022 1330   LYMPHSABS 1.6 07/16/2022 1330   MONOABS 0.5 07/16/2022 1330   EOSABS 0.1 07/16/2022 1330   BASOSABS 0.0 07/16/2022 1330     CMP     Component Value Date/Time   NA 136 07/16/2022 1435   K 4.3 07/16/2022 1435   CL 103 07/16/2022 1435   CO2 25 07/16/2022 1435   GLUCOSE 100 (H) 07/16/2022 1435   BUN 10 07/16/2022 1435   CREATININE 0.97 07/16/2022 1435   CREATININE 0.98 06/14/2015 0938   CALCIUM 11.2 (H) 07/16/2022 1435   PROT 6.4 (L) 07/16/2022 1435   ALBUMIN 3.3 (L) 07/16/2022 1435   AST 17 07/16/2022 1435   ALT 17 07/16/2022 1435   ALKPHOS 74 07/16/2022 1435   BILITOT 0.6 07/16/2022 1435   GFRNONAA >60 07/16/2022 1435   GFRNONAA >89 06/14/2015 0938   GFRAA >60 09/20/2017 0821   GFRAA >89 06/14/2015 0938     Imaging: CT HEAD CODE STROKE WO CONTRAST  Result Date: 07/16/2022 CLINICAL DATA:  Code stroke. EXAM: CT HEAD WITHOUT CONTRAST TECHNIQUE: Contiguous axial images were obtained from the base of the skull through the vertex without intravenous contrast. RADIATION DOSE REDUCTION: This exam was performed according to the departmental dose-optimization program which includes automated exposure control, adjustment of the mA and/or kV according to patient size and/or use of iterative reconstruction technique. COMPARISON:  CT head Feb 28, 2021. FINDINGS:  Brain: New areas of hypoattenuation and loss of gray differentiation in the left parietal and left temporal lobes. No evidence of acute hemorrhage. No mass lesion, midline shift or hydrocephalus. Patchy white matter hypodensities, nonspecific but compatible with chronic microvascular disease. Vascular: No hyperdense vessel identified. Skull: No acute fracture. Sinuses/Orbits: No acute findings. Other: No mastoid effusions. IMPRESSION: 1. Age indeterminate infarcts in the left temporal and parietal lobes, new since Feb 28, 2021 but favored chronic given appearance. MRI could further evaluate. 2. No acute hemorrhage. Code stroke imaging results were communicated on 07/16/2022 at 1:39 pm to provider 1:32 via telephone, who verbally acknowledged these results. Electronically Signed   By: Margaretha Sheffield M.D.   On: 07/16/2022 13:41      ECG: My personal interpretation is normal sinus rhythm    ASSESSMENT & PLAN:   Assessment & Plan by Problem: Principal Problem:   Stroke-like  symptoms Active Problems:   Dyslipidemia   Essential hypertension   Hypercalcemia   NINO AMANO is a 51 y.o. person living with a history of  CHF, hypertension, GERD, tobacco use, and prior stroke who presented with sudden-onset left-sided weakness, now admitted for possible stroke and further diagnostic workup, currently on hospital day 0   ---Left-sided weakness and numbness ---History of multiple left-sided cortical and subcortical infarcts Patient experienced a stroke in 02-2021 with subsequent imaging demonstrating multiple left-sided cortical and subcortical infarcts. He has mild right-sided weakness at baseline as a result. Today 9-28, he reports sudden-onset left-sided weakness with associated numbness. Head CT revealed left temporal and parietal lobe infarcts, likely chronic, and was negative for acute hemorrhage. Thrombolytics were withheld when his symptoms spontaneously resolved. These symptoms have recurred  intermittently a few times during his stay in the ED but are currently absent on exam. The fluctuating course of these symptoms is more consistent with transient ischemic attack than stroke. Further diagnostic workup with MRI is warranted to identify a potential underlying structural cause. > Consult neurology, appreciate recommendations > Atorvastatin 21m q24 > Aspirin 879mq24 > Clopidogrel 7545m24 > Brain MRI plus angiography > Acetaminophen 650m23m PRN > Lipid panel > Trend CMP and CBC > Echocardiogram with bubble study > Physical-occupational therapy evaluation   ---Hypercalcemia This is a chronic issue dating back to 2010.  Calcium 11.2 this admission.  His work-up was initiated in 02/2021 which showed high normal PTH, normal calcitriol, and undetectable PTHrP.  Low suspicion for malignancy with undetectable PTHrP and unremarkable CTA chest/abd/pel 2022.  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  Besides hypercalcemia, he has no other features of CRAB so low suspicion for multiple myeloma. > Check vitamin D level > Check 24hr urine calcium > Polyethylene glycol for constipation   ---Hypertension Blood pressure 172/127 upon arrival to the ED. Prescribed amlodipine and lisinopril but reports non-adherence for over one year. > Lisinopril 20mg66m > Hold amlodipine to avoid dramatic reductions in BP   ---Tobacco use Reports about 40ppd smoking history, currently smokes. > Nicotine patch 14mg 26m-Gastroesophageal reflux disease Chronic and stable. > Pantoprazole 40mg q66m --Thyroid nodule Solitary 3.7cm highly suspicious right-sided thyroid nodule identified via ultrasound 02-2021. > [facility]     Diet: Heart Healthy VTE: Enoxaparin IVF: None,None Code: Full  Prior to Admission Living Arrangement: Home, living with niece Anticipated Discharge Location: Home Barriers to Discharge: medical management and  further diagnostic workup  Dispo: Admit patient to Observation with expected length of stay less than 2 midnights.  Signed: Zekiah Coen,Serita Butcherternal Medicine Resident PGY-1  07/16/2022, 6:51 PM

## 2022-07-16 NOTE — ED Notes (Signed)
Patient transported to MRI 

## 2022-07-16 NOTE — ED Triage Notes (Signed)
Pt brought in as code stroke. LSN 1230 as pt was walking to bus stop. His nephew saw him fall at the bus stop at 1237. EMS called for left sided weakness/ left sided facial droop and slurred speech.

## 2022-07-16 NOTE — ED Notes (Signed)
Q30 min neuro checks until 5pm

## 2022-07-16 NOTE — ED Notes (Signed)
Pt complaining of new numbness to left arm/left leg and mouth. Notified stroke MD and stroke team. No drift to left side.

## 2022-07-16 NOTE — Hospital Course (Addendum)
-  Got back from work, walking to the bus. Left side felt weak at 1 PM. No fall  -Have not taken his meds since May or seen a PCP  -Sx come and goes. Now gone. Sx start from facial drooping, numbness in the mouth and go down to his LE.   -Reports constipation. Hx of hypercalcemia in mom. -No OTC, vitamin D, Ca sup or Tums  Fam -mom-HTN. DM. Stroke, hyper Ca  -Live w his cousin -Work in [place] -PCP: none -Tobacco: long time 32 years -Alcohol socially -No drugs since first stroke. Used cocaine before  Hypercalcemia This is a chronic issue dating back to 2010.  Calcium 11.2 this admission.  His work-up was initiated in 02/2021 which showed high normal PTH, normal calcitriol, and undetectable PTHrP.  Low suspicion for malignancy with undetectable PTHrP and unremarkable CTA chest/abd/pel 2022.  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  Besides hypercalcemia, he has no other features of CRAB so low suspicion for multiple myeloma -Obtain vitamin D level -Check 24-hour urine calcium -MiraLAX for constipation  9/29 Slept well overnight. He feels good strength on his left side. He has been able to ambulate with no issues.

## 2022-07-16 NOTE — ED Provider Notes (Signed)
At 5 PM, patient has no symptoms.  Denies any weakness, numbness or tingling.  Stable for admission for stroke work-up per neurology plan.  D/w Dr. Lorrin Goodell. He agrees with medical admission for stroke workup.  D/w internal medicine residents.    Ezequiel Essex, MD 07/16/22 Lurline Hare

## 2022-07-16 NOTE — Consult Note (Signed)
NEUROLOGY CONSULTATION NOTE   Date of service: July 16, 2022 Patient Name: Charles Callahan MRN:  [id number] DOB:  1971-04-15 Reason for consult: "L sided weakness, code stroke activated by EMS" Requesting Provider: Bethann Berkshire, MD _ _ _   _ __   _ __ _ _  __ __   _ __   __ _  History of Present Illness  Charles Callahan is a 51 y.o. male with PMH significant for diastolic congestive heart failure, hypertension, smoker, prior strokes and microhemorrhages with very mild residual right-sided weakness who presents with sudden onset left-sided weakness.  Patient reports that around noon today, he was walking to the bus stop when he had sudden onset left upper extremity and left lower extremity weakness.  His niece called EMS and he was brought in as a code stroke.  His symptoms were persistent on arrival to the ED and a CT head was obtained which was negative for a large territory infarct or ICH.  He was initially offered TNKase at 1337.  However, he had rapid improvement in his symptoms and was able to walk without any issues and move his left upper extremity without any issues.  Given significant improvement in his deficit and now with minimal residual left leg drift, felt that his symptoms were now too mild to treat and thus TNKase offered was withdrawn.  LKW: 1200 07/16/22 mRS: 0 and is able to walk by himself despite prior strokes with mild right-sided weakness. tNKASE: Initially offered at 1337 however patient had rapid improvement spontaneously with a NIH down to a 1 and thus TNK offered was withdrawn.  He will be closely monitored every 30 minutes while he is within the 4-1/2-hour TNKase window which ends at 5 PM. Thrombectomy: Not offered due to low suspicion for LVO and symptoms too mild to treat. NIHSS components Score: Comment  1a Level of Conscious 0[x]  1[]  2[]  3[]      1b LOC Questions 0[x]  1[]  2[]       1c LOC Commands 0[x]  1[]  2[]       2 Best Gaze 0[x]  1[]  2[]       3 Visual 0[x]   1[]  2[]  3[]      4 Facial Palsy 0[]  1[x]  2[]  3[]      5a Motor Arm - left 0[]  1[x]  2[]  3[]  4[]  UN[]    5b Motor Arm - Right 0[x]  1[]  2[]  3[]  4[]  UN[]    6a Motor Leg - Left 0[]  1[]  2[x]  3[]  4[]  UN[]    6b Motor Leg - Right 0[x]  1[]  2[]  3[]  4[]  UN[]    7 Limb Ataxia 0[]  1[]  2[x]  3[]  UN[]     8 Sensory 0[x]  1[]  2[]  UN[]      9 Best Language 0[x]  1[]  2[]  3[]      10 Dysarthria 0[x]  1[]  2[]  UN[]      11 Extinct. and Inattention 0[x]  1[]  2[]       TOTAL: 6    Symptoms later improved with NIHSS of 1 due to a mild LLE drift.   ROS   Constitutional Denies weight loss, fever and chills.   HEENT Denies changes in vision and hearing.   Respiratory Denies SOB and cough.   CV Denies palpitations and CP   GI Denies abdominal pain, nausea, vomiting and diarrhea.   GU Denies dysuria and urinary frequency.   MSK Denies myalgia and joint pain.   Skin Denies rash and pruritus.   Neurological Denies headache and syncope.   Psychiatric Denies recent changes in mood. Denies anxiety and depression.  Past History   Past Medical History:  Diagnosis Date   CHF (congestive heart failure) ([place])    Hypertension    Stroke [facility])    Past Surgical History:  Procedure Laterality Date   EYE SURGERY     Family History  Problem Relation Age of Onset   Diabetes Mother    Hypertension Mother    Social History   Socioeconomic History   Marital status: Married    Spouse name: Not on file   Number of children: Not on file   Years of education: Not on file   Highest education level: Not on file  Occupational History   Not on file  Tobacco Use   Smoking status: Every Day    Packs/day: 1.00    Types: Cigarettes   Smokeless tobacco: Never  Vaping Use   Vaping Use: Former  Substance and Sexual Activity   Alcohol use: Yes    Comment: rare   Drug use: Yes    Types: Cocaine   Sexual activity: Yes  Other Topics Concern   Not on file  Social History Narrative   ** Merged History Encounter **       Social  Determinants of Health   Financial Resource Strain: Not on file  Food Insecurity: Food Insecurity Present (03/01/2021)   Hunger Vital Sign    Worried About Running Out of Food in the Last Year: Sometimes true    Ran Out of Food in the Last Year: Sometimes true  Transportation Needs: Unmet Transportation Needs (03/01/2021)   PRAPARE - Hydrologist (Medical): Yes    Lack of Transportation (Non-Medical): Yes  Physical Activity: Not on file  Stress: Not on file  Social Connections: Not on file   Allergies  Allergen Reactions   Morphine And Related Nausea And Vomiting    Medications  (Not in a hospital admission)    Vitals   Vitals:   07/16/22 1300 07/16/22 1330 07/16/22 1337 07/16/22 1356  BP:  (!) 172/127 (!) 179/106 (!) 174/104  Pulse:  74  61  Resp:    16  Temp:    98.5 F (36.9 C)  TempSrc:    Oral  SpO2:    100%  Weight: 95.2 kg     Height: 5\' 6"  (1.676 m)        Body mass index is 33.88 kg/m.  Physical Exam   General: Laying comfortably in bed; in no acute distress.  HENT: Normal oropharynx and mucosa. Normal external appearance of ears and nose.  Neck: Supple, no pain or tenderness  CV: No JVD. No peripheral edema.  Pulmonary: Symmetric Chest rise. Normal respiratory effort.  Abdomen: Soft to touch, non-tender.  Ext: No cyanosis, edema, or deformity  Skin: No rash. Normal palpation of skin.   Musculoskeletal: Normal digits and nails by inspection. No clubbing.   Neurologic Examination  Mental status/Cognition: Alert, oriented to self, place, month and year, good attention. Speech/language: Fluent, comprehension intact, object naming intact, repetition intact.  Cranial nerves:   CN II Pupils equal and reactive to light, no VF deficits    CN III,IV,VI EOM intact, no gaze preference or deviation, no nystagmus    CN V normal sensation in V1, V2, and V3 segments bilaterally    CN VII no asymmetry, no nasolabial fold flattening     CN VIII normal hearing to speech    CN IX & X normal palatal elevation, no uvular deviation    CN XI  5/5 head turn and 5/5 shoulder shrug bilaterally   CN XII midline tongue protrusion    Motor:  Muscle bulk: normal, tone normal Mvmt Root Nerve  Muscle Right Left Comments  SA C5/6 Ax Deltoid 5 5   EF C5/6 Mc Biceps 5 5   EE C6/7/8 Rad Triceps 5 5   WF C6/7 Med FCR     WE C7/8 PIN ECU     F Ab C8/T1 U ADM/FDI 5 5   HF L1/2/3 Fem Illopsoas 5 4+   KE L2/3/4 Fem Quad 5 5   DF L4/5 D Peron Tib Ant 5 5   PF S1/2 Tibial Grc/Sol 5 5    Reflexes:  Right Left Comments  Pectoralis      Biceps (C5/6) 2 2   Brachioradialis (C5/6) 2 2    Triceps (C6/7) 2 2    Patellar (L3/4) 2 2    Achilles (S1)      Hoffman      Plantar     Jaw jerk    Sensation:  Light touch Intact throughout   Pin prick    Temperature    Vibration   Proprioception    Coordination/Complex Motor:  - Finger to Nose intact bilaterally - Heel to shin intact bilaterally - Rapid alternating movement are slowed mildly in left upper extremity. - Gait: Stride length normal. Arm swing poor. Base width narrow.  He is able to toe walk and heel walk.  Labs   CBC:  Recent Labs  Lab 07/16/22 1330 07/16/22 1332  WBC 4.1  --   NEUTROABS 1.9  --   HGB 15.8 16.3  HCT 46.3 48.0  MCV 92.4  --   PLT 272  --     Basic Metabolic Panel:  Lab Results  Component Value Date   NA 136 07/16/2022   K 4.8 07/16/2022   CO2 26 03/10/2021   GLUCOSE 95 07/16/2022   BUN 14 07/16/2022   CREATININE 1.00 07/16/2022   CALCIUM 12.0 (H) 03/10/2021   GFRNONAA >60 03/12/2021   GFRAA >60 09/20/2017   Lipid Panel:  Lab Results  Component Value Date   LDLCALC 102 (H) 03/01/2021   HgbA1c:  Lab Results  Component Value Date   HGBA1C 5.9 (H) 02/28/2021   Urine Drug Screen:     Component Value Date/Time   LABOPIA NONE DETECTED 02/27/2021 1810   COCAINSCRNUR NONE DETECTED 02/27/2021 1810   LABBENZ NONE DETECTED 02/27/2021 1810    AMPHETMU NONE DETECTED 02/27/2021 1810   THCU NONE DETECTED 02/27/2021 1810   LABBARB NONE DETECTED 02/27/2021 1810    Alcohol Level     Component Value Date/Time   ETH <10 02/27/2021 1810    CT Head without contrast(Personally reviewed): Age-indeterminate infarcts in the left temporal and parietal lobes which are new since Feb 28, 2021 but favored chronic given appearance.  CT angio Head and Neck with contrast: Pending  MRI Brain: pending  Impression   Charles Callahan is a 51 y.o. male with PMH significant for diastolic congestive heart failure, hypertension, smoker, prior strokes and microhemorrhages with very mild residual right-sided weakness who presents with sudden onset left-sided weakness.  He was initially offered TNKase, but offer withdrawn after significant spontaneous improvement in symptoms with only a mild residual left lower extremity drift.  His presentation is most concerning for a lacunar stroke likely in the setting of poorly controlled hypertension and smoking.  Recommendations   - Frequent Neuro checks per stroke unit protocol -  Recommend brain imaging with MRI Brain without contrast - recommend vessel imaging with CT angio head and neck with and without contrast. - Recommend obtaining TTE - Recommend obtaining Lipid panel with LDL - Please start statin if LDL > 70 - Recommend HbA1c - Antithrombotic -aspirin 81 mg daily along with Plavix 75 mg daily for 21 days, followed by Plavix 75 mg daily alone. - Recommend DVT ppx - SBP goal - permissive hypertension first 24 h < 220/110. Held home meds.  - Recommend Telemetry monitoring for arrythmia - Recommend bedside swallow screen prior to PO intake. - Stroke education booklet - Recommend PT/OT/SLP consult -Recommend keeping him in the ED with every 30 mins neurochecks until 5 PM.  This is to monitor him closely while he is within the [facility] window.  I spoke with the nurse and asked her to activate a code stroke  if he has any worsening of deficits until 5 PM.  If symptoms are stable until 5 PM, recommend admission to medicine service with stroke work-up.  If he worsens and requires TNKase, he will be admitted by our service.   Plan discussed with Dr. Estell Harpin with the ED team. ______________________________________________________________________   Thank you for the opportunity to take part in the care of this patient. If you have any further questions, please contact the neurology consultation attending.  Signed,  Erick Blinks [facility] Pager Number [phone] _ _ _   _ __   _ __ _ _  __ __   _ __   __ _

## 2022-07-16 NOTE — Code Documentation (Signed)
Stroke Response Nurse Documentation Code Documentation  DRURY ARDIZZONE is a 51 y.o. male arriving to [facility]  via [facility] on 07/16/2022 with past medical hx of diastolic CHF, hypertension, GERD, nicotine abuse, and stroke with mild right sided deficits. On aspirin 325 mg daily and clopidogrel 75 mg daily. Code stroke was activated by EMS.   Patient was LKW at 2 with his grandson saw him walking to the bus stop where he fell backwards and sat on the ground. His grandson noted a facial drop and the patient was unable to get up.   Stroke team at the bedside on patient arrival. Labs drawn and patient cleared for CT by EDP. Patient to CT with team. NIHSS 4, see documentation for details and code stroke times. Patient with left arm weakness and bilateral leg weakness on exam. The following imaging was completed:  CT Head. Patient is a candidate for IV Thrombolytic but his NIHSS improved when the dose was mixed. Patient is not a candidate for IR due to no LVO and neuro improvement.   Care Plan: monitor Q30 until out of the window for TNK (1700).   Bedside handoff with ED RN Hope.    Earma Reading  Stroke Response RN

## 2022-07-17 ENCOUNTER — Observation Stay (HOSPITAL_BASED_OUTPATIENT_CLINIC_OR_DEPARTMENT_OTHER): Payer: Medicaid Other

## 2022-07-17 DIAGNOSIS — I6389 Other cerebral infarction: Secondary | ICD-10-CM | POA: Diagnosis not present

## 2022-07-17 DIAGNOSIS — I639 Cerebral infarction, unspecified: Secondary | ICD-10-CM

## 2022-07-17 DIAGNOSIS — F17210 Nicotine dependence, cigarettes, uncomplicated: Secondary | ICD-10-CM

## 2022-07-17 LAB — CBC
HCT: 43.5 % (ref 39.0–52.0)
Hemoglobin: 15.1 g/dL (ref 13.0–17.0)
MCH: 32.5 pg (ref 26.0–34.0)
MCHC: 34.7 g/dL (ref 30.0–36.0)
MCV: 93.5 fL (ref 80.0–100.0)
Platelets: 264 10*3/uL (ref 150–400)
RBC: 4.65 MIL/uL (ref 4.22–5.81)
RDW: 13 % (ref 11.5–15.5)
WBC: 4.7 10*3/uL (ref 4.0–10.5)
nRBC: 0 % (ref 0.0–0.2)

## 2022-07-17 LAB — BASIC METABOLIC PANEL
Anion gap: 7 (ref 5–15)
BUN: 10 mg/dL (ref 6–20)
CO2: 23 mmol/L (ref 22–32)
Calcium: 11 mg/dL — ABNORMAL HIGH (ref 8.9–10.3)
Chloride: 106 mmol/L (ref 98–111)
Creatinine, Ser: 0.89 mg/dL (ref 0.61–1.24)
GFR, Estimated: >60 mL/min (ref >60)
Glucose, Bld: 107 mg/dL — ABNORMAL HIGH (ref 70–99)
Potassium: 3.8 mmol/L (ref 3.5–5.1)
Sodium: 136 mmol/L (ref 135–145)

## 2022-07-17 LAB — ECHOCARDIOGRAM COMPLETE BUBBLE STUDY
AR max vel: 3.09 cm2
AV Area VTI: 3.15 cm2
AV Area mean vel: 2.92 cm2
AV Mean grad: 6 mmHg
AV Peak grad: 11.2 mmHg
Ao pk vel: 1.67 m/s
Area-P 1/2: 2.83 cm2
S' Lateral: 3.3 cm

## 2022-07-17 LAB — LIPID PANEL
Cholesterol: 147 mg/dL (ref 0–200)
HDL: 30 mg/dL — ABNORMAL LOW (ref >40)
LDL Cholesterol: 103 mg/dL — ABNORMAL HIGH (ref 0–99)
Total CHOL/HDL Ratio: 4.9 RATIO
Triglycerides: 68 mg/dL (ref <150)
VLDL: 14 mg/dL (ref 0–40)

## 2022-07-17 LAB — HEMOGLOBIN A1C
Hgb A1c MFr Bld: 5.4 % (ref 4.8–5.6)
Mean Plasma Glucose: 108.28 mg/dL

## 2022-07-17 MED ORDER — NICOTINE 14 MG/24HR TD PT24: MEDICATED_PATCH | TRANSDERMAL | 3 refills | Status: DC

## 2022-07-17 MED ORDER — ASPIRIN 81 MG PO TBEC: 81.0000 mg | DELAYED_RELEASE_TABLET | Freq: Every day | ORAL | 12 refills | Status: DC

## 2022-07-17 MED ORDER — EZETIMIBE 10 MG PO TABS: 10.0000 mg | ORAL_TABLET | Freq: Every day | ORAL | 0 refills | Status: DC

## 2022-07-17 MED ORDER — CLOPIDOGREL BISULFATE 75 MG PO TABS: 75.0000 mg | ORAL_TABLET | Freq: Every day | ORAL | 0 refills | Status: DC

## 2022-07-17 MED ORDER — EZETIMIBE 10 MG PO TABS
10.0000 mg | ORAL_TABLET | Freq: Every day | ORAL | Status: DC
  Administered 2022-07-17: 10 mg via ORAL
  Filled 2022-07-17: qty 1

## 2022-07-17 NOTE — Evaluation (Signed)
Occupational Therapy Evaluation Patient Details Name: Charles Callahan MRN: [id number] DOB: 06/03/1971 Today's Date: 07/17/2022   History of Present Illness 51 year old male with a past medical history of diastolic CHF, hypertension, GERD, tobacco use, and prior stroke resulting in mild right-sided weakness who presents with sudden-onset left-sided weakness.   Clinical Impression   Patient admitted for the diagnosis above.  Patient seen one year prior for a CVA in which he went to [facility].  Patient continues to reside at home, recently started a job in [employer] for [place], and takes the bus to work.  He no longer uses an AD for mobility, and needed no assist for ADL/iADL.  Despite some residual left upper extremity weakness, patient endorses he is back to his baseline.  Patient is demonstrating good balance, and needed no assist with mobility on the unit, or ADL from sit/stand level.  Patient has no acute OT needs, and no post acute OT is anticipated.  Recommend follow up as prescribed by MD.       Recommendations for follow up therapy are one component of a multi-disciplinary discharge planning process, led by the attending physician.  Recommendations may be updated based on patient status, additional functional criteria and insurance authorization.   Follow Up Recommendations  No OT follow up    Assistance Recommended at Discharge PRN  Patient can return home with the following Assist for transportation    Functional Status Assessment  Patient has not had a recent decline in their functional status  Equipment Recommendations  None recommended by OT    Recommendations for Other Services       Precautions / Restrictions Precautions Precautions: None Restrictions Weight Bearing Restrictions: No      Mobility Bed Mobility Overal bed mobility: Independent                  Transfers Overall transfer level: Independent                        Balance Overall balance  assessment: No apparent balance deficits (not formally assessed)                                         ADL either performed or assessed with clinical judgement   ADL Overall ADL's : At baseline                                             Vision Patient Visual Report: No change from baseline       Perception Perception Perception: Within Functional Limits   Praxis Praxis Praxis: Intact    Pertinent Vitals/Pain Pain Assessment Pain Assessment: No/denies pain     Hand Dominance Right   Extremity/Trunk Assessment Upper Extremity Assessment Upper Extremity Assessment: Overall WFL for tasks assessed;LUE deficits/detail LUE Deficits / Details: mild residual weakness noted, but no impact on function LUE Sensation: WNL LUE Coordination: WNL   Lower Extremity Assessment Lower Extremity Assessment: Overall WFL for tasks assessed   Cervical / Trunk Assessment Cervical / Trunk Assessment: Normal   Communication Communication Communication: No difficulties   Cognition Arousal/Alertness: Awake/alert Behavior During Therapy: WFL for tasks assessed/performed Overall Cognitive Status: Within Functional Limits for tasks assessed  General Comments   BP a little high but VSS on RA    Exercises     Shoulder Instructions      Home Living Family/patient expects to be discharged to:: Private residence Living Arrangements: Parent Available Help at Discharge: Family;Available PRN/intermittently Type of Home: House Home Access: Level entry     Home Layout: One level     Bathroom Shower/Tub: Chief Strategy Officer: Standard Bathroom Accessibility: Yes How Accessible: Accessible via walker Home Equipment: None          Prior Functioning/Environment Prior Level of Function : Independent/Modified Independent;Working/employed               ADLs Comments: patient  takes the bus to work        OT Problem List: Impaired balance (sitting and/or standing)      OT Treatment/Interventions:      OT Goals(Current goals can be found in the care plan section) Acute Rehab OT Goals Patient Stated Goal: Hoping to go home today OT Goal Formulation: With patient Time For Goal Achievement: 07/24/22 Potential to Achieve Goals: Good  OT Frequency:      Co-evaluation              AM-PAC OT "6 Clicks" Daily Activity     Outcome Measure Help from another person eating meals?: None Help from another person taking care of personal grooming?: None Help from another person toileting, which includes using toliet, bedpan, or urinal?: None Help from another person bathing (including washing, rinsing, drying)?: None Help from another person to put on and taking off regular upper body clothing?: None Help from another person to put on and taking off regular lower body clothing?: None 6 Click Score: 24   End of Session Nurse Communication: Mobility status  Activity Tolerance: Patient tolerated treatment well Patient left: in chair;with call bell/phone within reach  OT Visit Diagnosis: Hemiplegia and hemiparesis Hemiplegia - Right/Left: Left Hemiplegia - dominant/non-dominant: Non-Dominant Hemiplegia - caused by: Cerebral infarction                Time: 1118-1140 OT Time Calculation (min): 22 min Charges:  OT General Charges $OT Visit: 1 Visit OT Evaluation $OT Eval Moderate Complexity: 1 Mod  07/17/2022  RP, OTR/L  Acute Rehabilitation Services  Office:  [phone]   Suzanna Obey 07/17/2022, 12:04 PM

## 2022-07-17 NOTE — Progress Notes (Addendum)
STROKE TEAM PROGRESS NOTE   INTERVAL HISTORY Patient is seen in his room with no family at the bedside.  Yesterday, he experienced sudden onset left sided weakness.  He was brought to the hospital and offered TNK, but symptoms improved before it could be administered.  MRI shows small acute infarct in posterior right basal ganglia.  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 of the brain shows chronic left MCA occlusion patient admits he has been noncompliant with his medication has not taken his antiplatelets and other medicines for the last 6 months with waiting to get his medicaid approved  Vitals:   07/17/22 1008 07/17/22 1009 07/17/22 1100 07/17/22 1220  BP: (!) 140/88  (!) 178/95 (!) 177/97  Pulse: (!) 51  (!) 55 (!) 52  Resp: 16  17 17   Temp:  98.1 F (36.7 C)  98.2 F (36.8 C)  TempSrc:  Oral  Oral  SpO2: 99%  98% 99%  Weight:      Height:       CBC:  Recent Labs  Lab 07/16/22 1330 07/16/22 1332 07/17/22 0352  WBC 4.1  --  4.7  NEUTROABS 1.9  --   --   HGB 15.8 16.3 15.1  HCT 46.3 48.0 43.5  MCV 92.4  --  93.5  PLT 272  --  264   Basic Metabolic Panel:  Recent Labs  Lab 07/16/22 1435 07/17/22 0352  NA 136 136  K 4.3 3.8  CL 103 106  CO2 25 23  GLUCOSE 100* 107*  BUN 10 10  CREATININE 0.97 0.89  CALCIUM 11.2* 11.0*   Lipid Panel:  Recent Labs  Lab 07/17/22 0352  CHOL 147  TRIG 68  HDL 30*  CHOLHDL 4.9  VLDL 14  LDLCALC 07/19/22*   HgbA1c:  Recent Labs  Lab 07/17/22 0352  HGBA1C 5.4   Urine Drug Screen:  Recent Labs  Lab 07/16/22 1552  LABOPIA NONE DETECTED  COCAINSCRNUR NONE DETECTED  LABBENZ NONE DETECTED  AMPHETMU NONE DETECTED  THCU NONE DETECTED  LABBARB NONE DETECTED    Alcohol Level  Recent Labs  Lab 07/16/22 1326  ETH <10    IMAGING past 24 hours MR BRAIN WO CONTRAST  Result Date: 07/16/2022 CLINICAL DATA:  Neuro deficit, acute, stroke suspected. Left-sided weakness. EXAM: MRI HEAD WITHOUT CONTRAST MRA HEAD WITHOUT CONTRAST MRA OF THE NECK WITHOUT AND  WITH CONTRAST TECHNIQUE: Multiplanar, multi-echo pulse sequences of the brain and surrounding structures were acquired without intravenous contrast. Angiographic images of the Circle of Willis were acquired using MRA technique without intravenous contrast. Angiographic images of the neck were acquired using MRA technique without and with intravenous contrast. Carotid stenosis measurements (when applicable) are obtained utilizing NASCET criteria, using the distal internal carotid diameter as the denominator. CONTRAST:  9.5 mL Vueway COMPARISON:  Head CT 07/16/2022. Head and neck CTA 02/28/2021. Head MRI 02/27/2021. FINDINGS: MR HEAD FINDINGS Brain: Small acute infarcts are noted posteriorly in the right basal ganglia involving the posterior lentiform nucleus and caudate body. There are multiple small chronic infarcts in the left temporal, superior left occipital, left parietal, and left frontal lobes, some of which are new from the 2022 MRI. There is chronic hemorrhage associated with many of these chronic infarcts, and this is superimposed on chronic microhemorrhages elsewhere in the cerebellum and cerebrum (including right thalamus) suggesting chronic hypertension. Patchy T2 hyperintensities elsewhere in the cerebral white matter bilaterally and in the pons are similar to the prior MRI and are nonspecific but compatible with extensive chronic small  vessel ischemic disease. Chronic lacunar infarcts are noted in the basal ganglia and right thalamus. The ventricles are normal in size. No mass, midline shift, or extra-axial fluid collection is identified. There is a mildly enlarged partially empty sella. Vascular: More fully evaluated below. Skull and upper cervical spine: Unremarkable bone marrow signal. Sinuses/Orbits: Unremarkable orbits. Mild mucosal thickening in the paranasal sinuses. Trace bilateral mastoid fluid. Other: None. MRA HEAD FINDINGS Anterior circulation: The internal carotid arteries are patent from  skull base to carotid termini with atherosclerotic siphon irregularity but no more than mild stenosis. The right A1 segment is poorly visualized and was shown to be hypoplastic on the prior CTA. The ACAs and right MCA are patent, and there is a chronic mild right M1 stenosis. There is occlusion of the proximal left M1 segment without significant reconstituted flow distally on this MRA. The prior CTA showed either a high-grade stenosis or segmental occlusion of the left MCA at this location with reconstituted flow in attenuated distal branch vessels. No aneurysm is identified. Posterior circulation: The included portions of the intracranial vertebral arteries are patent to the basilar with at most mild narrowing of the distal right V4 segment. Patent AICA and SCA origins are seen bilaterally. The basilar artery is patent with mild diffuse irregularity and up to mild narrowing. Posterior communicating arteries are diminutive or absent. Both PCAs are patent without evidence of a significant proximal stenosis. No aneurysm is identified. Anatomic variants: Hypoplastic right A1 segment. MRA NECK FINDINGS Aortic arch: Standard 3 vessel aortic arch. Widely patent brachiocephalic and subclavian arteries. Right carotid system: Patent without evidence of a significant stenosis or dissection. Left carotid system: Patent without evidence of a significant stenosis or dissection. Vertebral arteries: Patent and codominant with antegrade flow bilaterally. Possible mild stenosis of the left vertebral origin. No evidence of a significant stenosis or dissection elsewhere bilaterally. IMPRESSION: 1. Small acute infarcts in the posterior right basal ganglia. 2. Extensive chronic small vessel ischemic disease with multiple chronic infarcts as above. 3. Chronic left MCA occlusion. 4. Mild intracranial atherosclerosis without evidence of a high-grade proximal stenosis. 5. Widely patent cervical carotid arteries. Electronically Signed   By:  Sebastian Ache M.D.   On: 07/16/2022 21:59   MR ANGIO HEAD WO CONTRAST  Result Date: 07/16/2022 CLINICAL DATA:  Neuro deficit, acute, stroke suspected. Left-sided weakness. EXAM: MRI HEAD WITHOUT CONTRAST MRA HEAD WITHOUT CONTRAST MRA OF THE NECK WITHOUT AND WITH CONTRAST TECHNIQUE: Multiplanar, multi-echo pulse sequences of the brain and surrounding structures were acquired without intravenous contrast. Angiographic images of the Circle of Willis were acquired using MRA technique without intravenous contrast. Angiographic images of the neck were acquired using MRA technique without and with intravenous contrast. Carotid stenosis measurements (when applicable) are obtained utilizing NASCET criteria, using the distal internal carotid diameter as the denominator. CONTRAST:  9.5 mL Vueway COMPARISON:  Head CT 07/16/2022. Head and neck CTA 02/28/2021. Head MRI 02/27/2021. FINDINGS: MR HEAD FINDINGS Brain: Small acute infarcts are noted posteriorly in the right basal ganglia involving the posterior lentiform nucleus and caudate body. There are multiple small chronic infarcts in the left temporal, superior left occipital, left parietal, and left frontal lobes, some of which are new from the 2022 MRI. There is chronic hemorrhage associated with many of these chronic infarcts, and this is superimposed on chronic microhemorrhages elsewhere in the cerebellum and cerebrum (including right thalamus) suggesting chronic hypertension. Patchy T2 hyperintensities elsewhere in the cerebral white matter bilaterally and in the pons are similar  to the prior MRI and are nonspecific but compatible with extensive chronic small vessel ischemic disease. Chronic lacunar infarcts are noted in the basal ganglia and right thalamus. The ventricles are normal in size. No mass, midline shift, or extra-axial fluid collection is identified. There is a mildly enlarged partially empty sella. Vascular: More fully evaluated below. Skull and upper  cervical spine: Unremarkable bone marrow signal. Sinuses/Orbits: Unremarkable orbits. Mild mucosal thickening in the paranasal sinuses. Trace bilateral mastoid fluid. Other: None. MRA HEAD FINDINGS Anterior circulation: The internal carotid arteries are patent from skull base to carotid termini with atherosclerotic siphon irregularity but no more than mild stenosis. The right A1 segment is poorly visualized and was shown to be hypoplastic on the prior CTA. The ACAs and right MCA are patent, and there is a chronic mild right M1 stenosis. There is occlusion of the proximal left M1 segment without significant reconstituted flow distally on this MRA. The prior CTA showed either a high-grade stenosis or segmental occlusion of the left MCA at this location with reconstituted flow in attenuated distal branch vessels. No aneurysm is identified. Posterior circulation: The included portions of the intracranial vertebral arteries are patent to the basilar with at most mild narrowing of the distal right V4 segment. Patent AICA and SCA origins are seen bilaterally. The basilar artery is patent with mild diffuse irregularity and up to mild narrowing. Posterior communicating arteries are diminutive or absent. Both PCAs are patent without evidence of a significant proximal stenosis. No aneurysm is identified. Anatomic variants: Hypoplastic right A1 segment. MRA NECK FINDINGS Aortic arch: Standard 3 vessel aortic arch. Widely patent brachiocephalic and subclavian arteries. Right carotid system: Patent without evidence of a significant stenosis or dissection. Left carotid system: Patent without evidence of a significant stenosis or dissection. Vertebral arteries: Patent and codominant with antegrade flow bilaterally. Possible mild stenosis of the left vertebral origin. No evidence of a significant stenosis or dissection elsewhere bilaterally. IMPRESSION: 1. Small acute infarcts in the posterior right basal ganglia. 2. Extensive  chronic small vessel ischemic disease with multiple chronic infarcts as above. 3. Chronic left MCA occlusion. 4. Mild intracranial atherosclerosis without evidence of a high-grade proximal stenosis. 5. Widely patent cervical carotid arteries. Electronically Signed   By: Sebastian AcheAllen  Grady M.D.   On: 07/16/2022 21:59   MR ANGIO NECK W WO CONTRAST  Result Date: 07/16/2022 CLINICAL DATA:  Neuro deficit, acute, stroke suspected. Left-sided weakness. EXAM: MRI HEAD WITHOUT CONTRAST MRA HEAD WITHOUT CONTRAST MRA OF THE NECK WITHOUT AND WITH CONTRAST TECHNIQUE: Multiplanar, multi-echo pulse sequences of the brain and surrounding structures were acquired without intravenous contrast. Angiographic images of the Circle of Willis were acquired using MRA technique without intravenous contrast. Angiographic images of the neck were acquired using MRA technique without and with intravenous contrast. Carotid stenosis measurements (when applicable) are obtained utilizing NASCET criteria, using the distal internal carotid diameter as the denominator. CONTRAST:  9.5 mL Vueway COMPARISON:  Head CT 07/16/2022. Head and neck CTA 02/28/2021. Head MRI 02/27/2021. FINDINGS: MR HEAD FINDINGS Brain: Small acute infarcts are noted posteriorly in the right basal ganglia involving the posterior lentiform nucleus and caudate body. There are multiple small chronic infarcts in the left temporal, superior left occipital, left parietal, and left frontal lobes, some of which are new from the 2022 MRI. There is chronic hemorrhage associated with many of these chronic infarcts, and this is superimposed on chronic microhemorrhages elsewhere in the cerebellum and cerebrum (including right thalamus) suggesting chronic hypertension. Patchy T2  hyperintensities elsewhere in the cerebral white matter bilaterally and in the pons are similar to the prior MRI and are nonspecific but compatible with extensive chronic small vessel ischemic disease. Chronic lacunar  infarcts are noted in the basal ganglia and right thalamus. The ventricles are normal in size. No mass, midline shift, or extra-axial fluid collection is identified. There is a mildly enlarged partially empty sella. Vascular: More fully evaluated below. Skull and upper cervical spine: Unremarkable bone marrow signal. Sinuses/Orbits: Unremarkable orbits. Mild mucosal thickening in the paranasal sinuses. Trace bilateral mastoid fluid. Other: None. MRA HEAD FINDINGS Anterior circulation: The internal carotid arteries are patent from skull base to carotid termini with atherosclerotic siphon irregularity but no more than mild stenosis. The right A1 segment is poorly visualized and was shown to be hypoplastic on the prior CTA. The ACAs and right MCA are patent, and there is a chronic mild right M1 stenosis. There is occlusion of the proximal left M1 segment without significant reconstituted flow distally on this MRA. The prior CTA showed either a high-grade stenosis or segmental occlusion of the left MCA at this location with reconstituted flow in attenuated distal branch vessels. No aneurysm is identified. Posterior circulation: The included portions of the intracranial vertebral arteries are patent to the basilar with at most mild narrowing of the distal right V4 segment. Patent AICA and SCA origins are seen bilaterally. The basilar artery is patent with mild diffuse irregularity and up to mild narrowing. Posterior communicating arteries are diminutive or absent. Both PCAs are patent without evidence of a significant proximal stenosis. No aneurysm is identified. Anatomic variants: Hypoplastic right A1 segment. MRA NECK FINDINGS Aortic arch: Standard 3 vessel aortic arch. Widely patent brachiocephalic and subclavian arteries. Right carotid system: Patent without evidence of a significant stenosis or dissection. Left carotid system: Patent without evidence of a significant stenosis or dissection. Vertebral arteries:  Patent and codominant with antegrade flow bilaterally. Possible mild stenosis of the left vertebral origin. No evidence of a significant stenosis or dissection elsewhere bilaterally. IMPRESSION: 1. Small acute infarcts in the posterior right basal ganglia. 2. Extensive chronic small vessel ischemic disease with multiple chronic infarcts as above. 3. Chronic left MCA occlusion. 4. Mild intracranial atherosclerosis without evidence of a high-grade proximal stenosis. 5. Widely patent cervical carotid arteries. Electronically Signed   By: Sebastian Ache M.D.   On: 07/16/2022 21:59   CT HEAD CODE STROKE WO CONTRAST  Result Date: 07/16/2022 CLINICAL DATA:  Code stroke. EXAM: CT HEAD WITHOUT CONTRAST TECHNIQUE: Contiguous axial images were obtained from the base of the skull through the vertex without intravenous contrast. RADIATION DOSE REDUCTION: This exam was performed according to the departmental dose-optimization program which includes automated exposure control, adjustment of the mA and/or kV according to patient size and/or use of iterative reconstruction technique. COMPARISON:  CT head Feb 28, 2021. FINDINGS: Brain: New areas of hypoattenuation and loss of gray differentiation in the left parietal and left temporal lobes. No evidence of acute hemorrhage. No mass lesion, midline shift or hydrocephalus. Patchy white matter hypodensities, nonspecific but compatible with chronic microvascular disease. Vascular: No hyperdense vessel identified. Skull: No acute fracture. Sinuses/Orbits: No acute findings. Other: No mastoid effusions. IMPRESSION: 1. Age indeterminate infarcts in the left temporal and parietal lobes, new since Feb 28, 2021 but favored chronic given appearance. MRI could further evaluate. 2. No acute hemorrhage. Code stroke imaging results were communicated on 07/16/2022 at 1:39 pm to provider 1:32 via telephone, who verbally acknowledged these results. Electronically  Signed   By: Feliberto Harts M.D.    On: 07/16/2022 13:41    PHYSICAL EXAM General:  Alert, well-developed, well-nourished mildly obese middle-age African-American male in no acute distress Respiratory:  Regular, unlabored respirations on room air  NEURO:  Mental Status: AA&Ox3  Speech/Language: speech is without dysarthria or aphasia.  Fluency, and comprehension intact.  Cranial Nerves:  II: PERRL. Visual fields full.  III, IV, VI: EOMI. Eyelids elevate symmetrically.  V: Sensation is intact to light touch and symmetrical to face.  VII: Smile is symmetrical.  VIII: hearing intact to voice. IX, X: Phonation is normal.  KN:[id number] shrug 5/5. XII: tongue is midline without fasciculations. Motor: 5/5 strength to all muscle groups tested, but subtle weakness of left hand grip noted.  Diminished fine finger movements on the left.  Orbits right over left upper extremity. Tone: is normal and bulk is normal Sensation- Intact to light touch bilaterally.  Coordination: FTN intact bilaterally.No drift. Right hand orbits left Gait- deferred   ASSESSMENT/PLAN Mr. DEMONTAE ANTUNES is a 51 y.o. male with history of CHF, HTN, smoking and stroke presenting with sudden onset left sided weakness.  He was brought to the hospital and offered TNK, but symptoms improved before it could be administered.  MRI shows small acute infarct in posterior right basal ganglia.  Stroke:  right basal ganglia infarct  Etiology:  small vessel disease  Code Stroke CT head Age indeterminate infarcts in left temporal and parietal lobes, likely chronic MRI  small acute infarct in posterior right basal ganglia, extensive small vessel disease MRA  chronic left MCA occlusion 2D Echo EF 60 to 65%.  No cardiac source of embolism.  LDL 103 HgbA1c 5.4 VTE prophylaxis - lovenox    Diet   Diet Heart Room service appropriate? Yes; Fluid consistency: Thin   No antithrombotic prior to admission, now on aspirin 81 mg daily and clopidogrel 75 mg daily.x 3 weeks and then  aspirin alone Therapy recommendations: no PT/OT follow up Disposition:  pending  Hypertension Home meds:  none Stable Permissive hypertension (OK if < 220/120) but gradually normalize in 5-7 days Long-term BP goal normotensive  Hyperlipidemia Home meds:  atorvastatin 80 mg daily, resumed in hospital LDL 103, goal < 70 Add ezetimibe 10 mg daily  Continue statin at discharge   Other Stroke Risk Factors Cigarette smoker advised to stop smoking Obesity, Body mass index is 33.88 kg/m., BMI >/= 30 associated with increased stroke risk, recommend weight loss, diet and exercise as appropriate  Hx stroke Congestive heart failure  Other Active Problems none  Hospital day # 0  Cortney E Ernestina Columbia , MSN, AGACNP-BC [facility] See Amion for schedule and pager information 07/17/2022 1:29 PM   STROKE MD NOTE :  I have personally obtained history,examined this patient, reviewed notes, independently viewed imaging studies, participated in medical decision making and plan of care.ROS completed by me personally and pertinent positives fully documented  I have made any additions or clarifications directly to the above note. Agree with note above.  Patient presented with left-sided weakness secondary to small right subcortical lacunar infarct likely from small vessel disease.  He was offered IV TNK but not administered as he showed quick improvement in his deficits..  Recommend mobilize out of bed.  Therapy consults.   .  Recommend aspirin and Plavix for 3 weeks and then aspirin alone.  Patient counseled to be compliant with medications and medical follow-up.  Follow-up with outpatient stroke clinic in  2 months.  Stroke team will sign off.  Can call for questions greater than 50% time during this 50-minute visit was spent in counseling and coordination of care about his lacunar stroke and discussion about stroke evaluation, prevention, treatment and answering questions  Antony Contras,  MD Medical Director [place] Pager: [phone] 07/17/2022 3:42 PM   To contact Stroke Continuity provider, please refer to [email or web address] After hours, contact General Neurology

## 2022-07-17 NOTE — Discharge Instructions (Addendum)
[name], Charles Callahan were admitted to the hospital with concerning findings for a new stroke. On imaging you were found to have new findings consistent with a stroke. Additional testing was negative and we suspect this was from your medical conditions of high blood pressure and high cholesterol. Please continue to take your medications and follow up in our clinic which is located on the ground floor of the hospital.   Please take plavix and aspirin for 3 weeks and then aspirin alone.   Please call with any questions

## 2022-07-17 NOTE — Progress Notes (Signed)
Subjective:  Patient reports a good night of sleep. He feels that the strength on his left side returned to baseline and has been ambulating without difficulty. Discussed plans for physical therapy evaluation and emphasized importance of medication adherence at home. Patient expressed understanding of and agreement with this plan.   Objective: Vitals over previous 24hr: Vitals:   07/17/22 0400 07/17/22 0500 07/17/22 0600 07/17/22 0627  BP: 136/74 (!) 151/80 (!) 164/77   Pulse: 67 (!) 49 77   Resp: (!) 0 14 17   Temp:    98 F (36.7 C)  TempSrc:    Oral  SpO2: 97% 97% 98%   Weight:      Height:        General:                       awake and alert, lying comfortably in bed, cooperative, not in acute distress Head:                           normocephalic and atraumatic, oral mucosa moist Eyes:                            extraocular movements intact, conjunctivae pink Lungs:                          normal respiratory effort, breathing unlabored, symmetrical chest rise, no crackles or wheezing Cardiac:                        regular rate and rhythm, normal S1 and S2, no pitting edema Musculoskeletal:          full range of motion in joints, motor strength 5- /5 in all four extremities including grip Neurologic:                   oriented to person-place-time, slightly antalgic gait, no gross focal neurologic deficits Psychiatric:                   mood and affect normal, intelligible speech      Assessment/Plan: Charles Callahan is a 51 y.o. person living with a history of  CHF, hypertension, GERD, tobacco use, and prior stroke who presented with sudden-onset left-sided weakness, now admitted for possible stroke and further diagnostic workup, currently on hospital day 0     ---Infarcts in right posterior basal ganglia ---History of multiple left-sided cortical and subcortical infarcts Patient experienced a stroke in 02-2021 with subsequent imaging demonstrating multiple  left-sided cortical and subcortical infarcts. He has mild right-sided weakness at baseline as a result. On 9-28, he experienced sudden-onset left-sided weakness with associated numbness. Head CT was negative for acute hemorrhage. Thrombolytics were withheld when his symptoms spontaneously resolved. Brain MRI revealed small acute infarcts in posterior right basal ganglia and a chronic left MCA occlusion. Since admission, the patient's left-sided weakness and numbness have improved to has baseline. Treatment team will continue post-stroke medication regimen with triple therapy. Lipid panel demonstrated LDL 103, treatment goal is <70. Physical and occupational therapy have cleared patient for discharge home. Given multiple risk factors and medication non-adherence, counseling prior to discharge with close outpatient follow-up is warranted. Discharge pending echocardiogram to evaluate for PFO or thrombus as potential etiologies. > Consult neurology, appreciate recommendations > Atorvastatin 30m q24 > Aspirin 893m  q24 > Clopidogrel 67m q24 > Acetaminophen [id number] PRN > Echocardiogram with bubble study > Anticipate discharge tomorrow 9-30     ---Hypercalcemia This is a chronic issue dating back to 2010.  Calcium 11.2 this admission.  His work-up was initiated in 02-2021 which showed high-normal PTH, normal calcitriol, and undetectable PTHrP.  Low suspicion for malignancy with undetectable PTHrP and unremarkable CTA chest-abdomen-pelvis in 2022. 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 Besides hypercalcemia, he has no other features of CRAB so low suspicion for multiple myeloma. > Follow up vitamin D level > Follow up 24hr urine calcium level > Polyethylene glycol for constipation     ---Hypertension Blood pressure 172/127 upon arrival to the ED. Prescribed amlodipine and lisinopril but reports non-adherence for over one year. > Lisinopril 2022m24 > Hold  amlodipine to avoid dramatic reductions in BP     ---Tobacco use Reports about 40ppd smoking history, currently smokes. > Nicotine patch 20m56m  ---Gastroesophageal reflux disease Chronic and stable. > Pantoprazole 40mg15m     --Thyroid nodule Solitary 3.7cm highly suspicious right-sided thyroid nodule identified via ultrasound 02-2021. > [facility]          Principal Problem:   Stroke-like symptoms Active Problems:   Dyslipidemia   Essential hypertension   Hypercalcemia   Prior to Admission Living Arrangement: Anticipated Discharge Location: Barriers to Discharge: Dispo: Anticipated discharge in approximately 1 day(s).   Dan HRoswell NickelInternal Medicine PGY-1 Pager [phone] 5pm on weekdays and 1pm on weekends: On Call pager [phone]

## 2022-07-17 NOTE — Discharge Summary (Addendum)
Name: Charles Callahan MRN: [id number] DOB: 1971/06/30 51 y.o. PCP: Pcp, No  Date of Admission: 07/16/2022  1:32 PM Date of Discharge: 07/17/2022  5:25 PM Attending Physician: Lucious Groves, DO  Discharge Diagnosis: Principal Problem:   Acute cerebrovascular accident (CVA) of basal ganglia ([place]) Active Problems:   Dyslipidemia   Essential hypertension   Hypercalcemia     Discharge Medications: Allergies as of 07/17/2022       Reactions   Morphine And Related Nausea And Vomiting        Medication List     STOP taking these medications    benzocaine 10 % mucosal gel Commonly known as: ORAJEL       TAKE these medications    aspirin EC 81 MG tablet Take 1 tablet (81 mg total) by mouth daily. Swallow whole. Start taking on: July 18, 2022   atorvastatin 80 MG tablet Commonly known as: LIPITOR Take 1 tablet (80 mg total) by mouth daily.   clopidogrel 75 MG tablet Commonly known as: PLAVIX Take 1 tablet (75 mg total) by mouth daily. Start taking on: July 18, 2022   ezetimibe 10 MG tablet Commonly known as: ZETIA Take 1 tablet (10 mg total) by mouth daily. Start taking on: July 18, 2022   nicotine 14 mg/24hr patch Commonly known as: NICODERM CQ - dosed in mg/24 hours Apply 1 patch (14 mg) daily x2 weeks What changed: Another medication with the same name was removed. Continue taking this medication, and follow the directions you see here.   pantoprazole 40 MG tablet Commonly known as: PROTONIX Take 1 tablet (40 mg total) by mouth daily.        Disposition and follow-up:   Charles Callahan was discharged from [facility] in Good condition.  At the hospital follow up visit please address:  1.  Ensure that patient is taking all of his medications as prescribed, repeat lipid panel for LDL goal < 70, please also evaluate his thyroid nodule identified via ultrasound in 02-2021  2.  Labs / imaging needed at time of follow-up:  lipid panel, thyroid ultrasound  3.  Pending labs/ test needing follow-up: 24hr urine calcium and vitamin D levels to complete hypercalcemia workup   Follow-up Appointments:  Follow-up Information     [facility] Follow up.   Contact information: [address] [facility] [place] [place] [facility] Course by problem list:  ---Infarcts in right posterior basal ganglia ---History of multiple left-sided cortical and subcortical infarcts Patient experienced a stroke in 02-2021 with subsequent imaging demonstrating multiple left-sided cortical and subcortical infarcts. He has mild right-sided weakness at baseline as a result. At that time, he was prescribed several medications including antihypertensives, but self-discontinued all of them shortly after discharge. On 9-28, he experienced sudden-onset left-sided weakness with associated numbness. Head CT was negative for acute hemorrhage. Thrombolytics were withheld when his symptoms spontaneously resolved. Brain MRI revealed small acute infarcts in posterior right basal ganglia and a chronic left MCA occlusion. He received post-stroke medication regimen with triple therapy. During admission, the patient's left-sided weakness and numbness improved to has baseline. Physical and occupational therapy have cleared patient for discharge home. Lipid panel demonstrated LDL 103, treatment goal is <70.  Patient was counseled on importance of medication adherence to prevent future strokes and will establish care with [facility].     ---  Hypercalcemia This is a chronic issue dating back to 2010. Calcium was at 11.2 this admission.  His work-up was initiated in 02-2021 which showed high-normal PTH, normal calcitriol, and undetectable PTHrP.  Low suspicion for malignancy with undetectable PTHrP and unremarkable CTA chest-abdomen-pelvis in 2022. 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 Besides hypercalcemia, he has no other features of CRAB so low suspicion for multiple myeloma. Hypercalcemia workup including vitamin D and 24hr urine calcium levels were still pending at the time of discharge, will need follow-up in outpatient setting.        Discharge Exam:   BP (!) 171/106 (BP Location: Left Arm) Comment: RN Notified  Pulse 68   Temp 98 F (36.7 C) (Oral)   Resp 18   Ht '5\' 6"'  (1.676 m)   Wt 95.2 kg   SpO2 99%   BMI 33.88 kg/m  Discharge exam:  General:                       awake and alert, lying comfortably in bed, cooperative, not in acute distress Head:                           normocephalic and atraumatic, oral mucosa moist Eyes:                            extraocular movements intact, conjunctivae pink Lungs:                          normal respiratory effort, breathing unlabored, symmetrical chest rise, no crackles or wheezing Cardiac:                        regular rate and rhythm, normal S1 and S2, no pitting edema Musculoskeletal:          full range of motion in joints, motor strength 5- /5 in all four extremities including grip Neurologic:                   oriented to person-place-time, slightly antalgic gait, no gross focal neurologic deficits Psychiatric:                   mood and affect normal, intelligible speech    Pertinent Labs, Studies, and Procedures:      Latest Ref Rng & Units 07/17/2022    3:52 AM 07/16/2022    1:32 PM 07/16/2022    1:30 PM  CBC  WBC 4.0 - 10.5 K/uL 4.7   4.1   Hemoglobin 13.0 - 17.0 g/dL 15.1  16.3  15.8   Hematocrit 39.0 - 52.0 % 43.5  48.0  46.3   Platelets 150 - 400 K/uL 264   272       Latest Ref Rng & Units 07/17/2022    3:52 AM 07/16/2022    2:35 PM 07/16/2022    1:32 PM  CMP  Glucose 70 - 99 mg/dL 107  100  95   BUN 6 - 20 mg/dL '10  10  14   ' Creatinine 0.61 - 1.24 mg/dL 0.89  0.97  1.00   Sodium 135 - 145 mmol/L 136  136  136   Potassium 3.5 -  5.1 mmol/L 3.8  4.3  4.8   Chloride 98 - 111 mmol/L 106  103  102   CO2  22 - 32 mmol/L 23  25    Calcium 8.9 - 10.3 mg/dL 11.0  11.2    Total Protein 6.5 - 8.1 g/dL  6.4    Total Bilirubin 0.3 - 1.2 mg/dL  0.6    Alkaline Phos 38 - 126 U/L  74    AST 15 - 41 U/L  17    ALT 0 - 44 U/L  17     ECHOCARDIOGRAM COMPLETE BUBBLE STUDY  Result Date: 07/17/2022    ECHOCARDIOGRAM REPORT   Patient Name:   Charles Callahan Date of Exam: 07/17/2022 Medical Rec #:  [id number]    Height:       66.0 in Accession #:    [id number]   Weight:       209.9 lb Date of Birth:  [birth year]    BSA:          2.041 m Patient Age:    73 years     BP:           164/77 mmHg Patient Gender: M            HR:           67 bpm. Exam Location:  Inpatient Procedure: 2D Echo, Cardiac Doppler, Color Doppler and Saline Contrast Bubble            Study Indications:    Stroke  History:        Patient has prior history of Echocardiogram examinations, most                 recent 03/01/2021. CHF, Stroke; Risk Factors:Hypertension.  Sonographer:    Memory Argue Referring Phys: [id number] ERIK C HOFFMAN IMPRESSIONS  1. Left ventricular ejection fraction, by estimation, is 60 to 65%. The left ventricle has normal function. The left ventricle has no regional wall motion abnormalities. There is moderate left ventricular hypertrophy. Left ventricular diastolic parameters were normal.  2. Right ventricular systolic function is normal. The right ventricular size is normal.  3. The mitral valve is abnormal. Trivial mitral valve regurgitation. No evidence of mitral stenosis.  4. The aortic valve is tricuspid. Aortic valve regurgitation is not visualized. No aortic stenosis is present.  5. The inferior vena cava is normal in size with greater than 50% respiratory variability, suggesting right atrial pressure of 3 mmHg.  6. Agitated saline contrast bubble study was negative, with no evidence of any interatrial shunt. FINDINGS  Left Ventricle: Left ventricular ejection  fraction, by estimation, is 60 to 65%. The left ventricle has normal function. The left ventricle has no regional wall motion abnormalities. The left ventricular internal cavity size was normal in size. There is  moderate left ventricular hypertrophy. Left ventricular diastolic parameters were normal. Right Ventricle: The right ventricular size is normal. No increase in right ventricular wall thickness. Right ventricular systolic function is normal. Left Atrium: Left atrial size was normal in size. Right Atrium: Right atrial size was normal in size. Pericardium: There is no evidence of pericardial effusion. Mitral Valve: The mitral valve is abnormal. Mild mitral annular calcification. Trivial mitral valve regurgitation. No evidence of mitral valve stenosis. Tricuspid Valve: The tricuspid valve is normal in structure. Tricuspid valve regurgitation is trivial. No evidence of tricuspid stenosis. Aortic Valve: The aortic valve is tricuspid. Aortic valve regurgitation is not visualized. No aortic stenosis is present. Aortic valve mean gradient measures 6.0 mmHg. Aortic valve peak gradient measures 11.2 mmHg. Aortic valve area, by VTI measures 3.15  cm. Pulmonic Valve:  The pulmonic valve was normal in structure. Pulmonic valve regurgitation is trivial. No evidence of pulmonic stenosis. Aorta: The aortic root is normal in size and structure. Venous: The inferior vena cava is normal in size with greater than 50% respiratory variability, suggesting right atrial pressure of 3 mmHg. IAS/Shunts: No atrial level shunt detected by color flow Doppler. Agitated saline contrast was given intravenously to evaluate for intracardiac shunting. Agitated saline contrast bubble study was negative, with no evidence of any interatrial shunt.  LEFT VENTRICLE PLAX 2D LVIDd:         4.90 cm   Diastology LVIDs:         3.30 cm   LV e' medial:    6.99 cm/s LV PW:         1.50 cm   LV E/e' medial:  8.4 LV IVS:        1.50 cm   LV e' lateral:    9.79 cm/s LVOT diam:     2.40 cm   LV E/e' lateral: 6.0 LV SV:         98 LV SV Index:   48 LVOT Area:     4.52 cm  RIGHT VENTRICLE TAPSE (M-mode): 2.0 cm LEFT ATRIUM             Index        RIGHT ATRIUM           Index LA diam:        3.70 cm 1.81 cm/m   RA Area:     15.90 cm LA Vol (A2C):   62.0 ml 30.37 ml/m  RA Volume:   37.20 ml  18.22 ml/m LA Vol (A4C):   47.6 ml 23.32 ml/m LA Biplane Vol: 54.5 ml 26.70 ml/m  AORTIC VALVE AV Area (Vmax):    3.09 cm AV Area (Vmean):   2.92 cm AV Area (VTI):     3.15 cm AV Vmax:           167.00 cm/s AV Vmean:          111.000 cm/s AV VTI:            0.310 m AV Peak Grad:      11.2 mmHg AV Mean Grad:      6.0 mmHg LVOT Vmax:         114.00 cm/s LVOT Vmean:        71.600 cm/s LVOT VTI:          0.216 m LVOT/AV VTI ratio: 0.70  AORTA Ao Root diam: 3.60 cm Ao Asc diam:  3.10 cm MITRAL VALVE MV Area (PHT): 2.83 cm    SHUNTS MV Decel Time: 268 msec    Systemic VTI:  0.22 m MV E velocity: 58.70 cm/s  Systemic Diam: 2.40 cm MV A velocity: 98.20 cm/s MV E/A ratio:  0.60 Jenkins Rouge MD Electronically signed by Jenkins Rouge MD Signature Date/Time: 07/17/2022/2:36:41 PM    Final     Charles BRAIN WO CONTRAST IMPRESSION: 1. Small acute infarcts in the posterior right basal ganglia. 2. Extensive chronic small vessel ischemic disease with multiple chronic infarcts as above. 3. Chronic left MCA occlusion. 4. Mild intracranial atherosclerosis without evidence of a high-grade proximal stenosis. 5. Widely patent cervical carotid arteries.    Charles ANGIO HEAD WO CONTRAST IMPRESSION: 1. Small acute infarcts in the posterior right basal ganglia. 2. Extensive chronic small vessel ischemic disease with multiple chronic infarcts as above. 3. Chronic left MCA occlusion. 4. Mild intracranial  atherosclerosis without evidence of a high-grade proximal stenosis. 5. Widely patent cervical carotid arteries.    Charles ANGIO NECK W WO CONTRAST IMPRESSION: 1. Small acute infarcts in the posterior  right basal ganglia. 2. Extensive chronic small vessel ischemic disease with multiple chronic infarcts as above. 3. Chronic left MCA occlusion. 4. Mild intracranial atherosclerosis without evidence of a high-grade proximal stenosis. 5. Widely patent cervical carotid arteries.     Discharge Instructions: Discharge Instructions     Call MD for:  extreme fatigue   Complete by: As directed    Call MD for:  persistant dizziness or light-headedness   Complete by: As directed    Call MD for:  persistant nausea and vomiting   Complete by: As directed    Call MD for:  temperature >100.4   Complete by: As directed    Diet - low sodium heart healthy   Complete by: As directed    Increase activity slowly   Complete by: As directed       Charles Callahan, it was a pleasure to take care of you while you were in the hospital. Your left-sided weakness was caused by a stroke which was likely the result of high blood pressure, high cholesterol, and smoking. The medications that we have prescribed you will help minimize these risk factors. Please follow-up with [place] at the [facility] clinic and continue to take your medications.    Signed: Roswell Nickel, MD Internal Medicine PGY-1 Pager [phone]

## 2022-07-17 NOTE — Progress Notes (Signed)
PT Cancellation Note  Patient Details Name: Charles Callahan MRN: [id number] DOB: 08-09-1971   Cancelled Treatment:    Reason Eval/Treat Not Completed: PT screened, no needs identified, will sign off.  Pt walked with OT and nursing has observed, pt feels he is at baseline.  No further PT needs identified, but encouraged pt to let nursing know if this changes.   Ramond Dial 07/17/2022, 12:26 PM  Mee Hives, PT PhD Acute Rehab Dept. Number: [place] [facility] [place]

## 2022-07-18 LAB — HIV ANTIBODY (ROUTINE TESTING W REFLEX): HIV Screen 4th Generation wRfx: NONREACTIVE

## 2022-07-20 LAB — VITAMIN D 25 HYDROXY (VIT D DEFICIENCY, FRACTURES)

## 2022-07-28 ENCOUNTER — Ambulatory Visit (INDEPENDENT_AMBULATORY_CARE_PROVIDER_SITE_OTHER): Payer: Medicaid Other | Admitting: Internal Medicine

## 2022-07-28 VITALS — BP 174/100 | HR 58 | Temp 98.1°F | Wt 209.0 lb

## 2022-07-28 DIAGNOSIS — E041 Nontoxic single thyroid nodule: Secondary | ICD-10-CM

## 2022-07-28 DIAGNOSIS — E785 Hyperlipidemia, unspecified: Secondary | ICD-10-CM

## 2022-07-28 DIAGNOSIS — I639 Cerebral infarction, unspecified: Secondary | ICD-10-CM | POA: Diagnosis not present

## 2022-07-28 MED ORDER — ATORVASTATIN CALCIUM 80 MG PO TABS: 80.0000 mg | ORAL_TABLET | Freq: Every day | ORAL | 3 refills | Status: DC

## 2022-07-28 MED ORDER — NICOTINE 21 MG/24HR TD PT24: 21.0000 mg | MEDICATED_PATCH | TRANSDERMAL | 1 refills | Status: AC

## 2022-07-28 MED ORDER — AMLODIPINE BESYLATE-VALSARTAN 10-160 MG PO TABS: 1.0000 | ORAL_TABLET | Freq: Every day | ORAL | 1 refills | Status: DC

## 2022-07-28 NOTE — Patient Instructions (Signed)
Dear Charles Callahan,  Thank you for trusting [place] with your care.  We discussed your recent stroke, blood pressure, and thyroid.  For your stroke, I have ordered the statin to be picked up. I also re-ordeered the nicotine patches.   For your blood pressure, I have ordered a medication amlodipine-valsartan to take.   It is recommended that you get a biopsy of the thyroid nodule. I have ordered this to be done at your convenience. Please return in 1 month for a blood pressure check.

## 2022-07-28 NOTE — Progress Notes (Signed)
   CC: hfu  HPI:[name] is a 51 y.o. male who presents for evaluation of hfu. Please see individual problem based A/P for details.  Patient initially had trouble walking and left side numbness. States this has improved and he is doing "good today". He has been taking aspirin, plavix, and zetia. Has not filled statin. Continues to smoke   Depression, PHQ-9: Based on the patients  Charles Callahan Office Visit from 03/27/2021 in [facility]  PHQ-9 Total Score 15      score we have .  Past Medical History:  Diagnosis Date   CHF (congestive heart failure) ([place])    Hypertension    Stroke ([place])    Review of Systems:   See hpi  Physical Exam: There were no vitals filed for this visit.   General: nad HEENT: Conjunctiva nl , antiicteric sclerae, moist mucous membranes, no exudate or erythema Cardiovascular: Normal rate, regular rhythm.  No murmurs, rubs, or gallops Pulmonary : Equal breath sounds, No wheezes, rales, or rhonchi Abdominal: soft, nontender,  bowel sounds present Ext: No edema in lower extremities, no tenderness to palpation of lower extremities.   Assessment & Plan:   See Encounters Tab for problem based charting.  Patient discussed with Dr. Evette Doffing

## 2022-07-29 ENCOUNTER — Telehealth: Payer: Self-pay

## 2022-07-29 NOTE — Telephone Encounter (Signed)
Attempted to call both patient's number as well as spouse' number. Unable to contact either. Will continue to try to contact pt.

## 2022-07-29 NOTE — Telephone Encounter (Signed)
Pt states there is no medication for him at the pharmacy. Please call pt back. Pt do not know the name of the meds.

## 2022-07-29 NOTE — Telephone Encounter (Signed)
Call to [facility] on [address] [employer].  Prescription are awaiting pick up by patient there.  Unable to reach patient by phone to notify him that his medication has been sent to the [facility] and is ready for pick up.

## 2022-08-03 ENCOUNTER — Encounter: Payer: Self-pay | Admitting: Internal Medicine

## 2022-08-03 DIAGNOSIS — E041 Nontoxic single thyroid nodule: Secondary | ICD-10-CM | POA: Insufficient documentation

## 2022-08-03 NOTE — Assessment & Plan Note (Signed)
Fine needle biopsy ordered.

## 2022-08-03 NOTE — Assessment & Plan Note (Signed)
Patient initially had trouble walking and left side numbness. States this has improved and he is doing "good today". He has been taking aspirin, plavix, and zetia. Has not filled statin. Continues to smoke  Strength and sensation symmetric on exam. Speech fluent, no focal deficits.   Seems to be recovering well from recent stroke. Risk factor mitigation will be important.  - will work on BP control, medication adherence, and smoking cessation.

## 2022-08-04 NOTE — Progress Notes (Signed)
Internal Medicine Clinic Attending ° °Case discussed with Dr. Gawaluck  At the time of the visit.  We reviewed the resident’s history and exam and pertinent patient test results.  I agree with the assessment, diagnosis, and plan of care documented in the resident’s note.  °

## 2022-08-04 NOTE — Addendum Note (Signed)
Addended by: Lalla Brothers T on: 08/04/2022 09:56 AM   Modules accepted: Level of Service

## 2022-08-07 NOTE — Addendum Note (Signed)
Addended by: Delene Ruffini T on: 08/07/2022 11:04 AM   Modules accepted: Orders

## 2022-08-08 ENCOUNTER — Ambulatory Visit (HOSPITAL_COMMUNITY)
Admission: EM | Admit: 2022-08-08 | Discharge: 2022-08-08 | Disposition: A | Discharge status: Home / Self Care | Payer: Medicaid Other | Attending: Nurse Practitioner | Admitting: Nurse Practitioner

## 2022-08-08 DIAGNOSIS — U071 COVID-19: Secondary | ICD-10-CM | POA: Insufficient documentation

## 2022-08-08 DIAGNOSIS — R45851 Suicidal ideations: Secondary | ICD-10-CM | POA: Insufficient documentation

## 2022-08-08 DIAGNOSIS — F29 Unspecified psychosis not due to a substance or known physiological condition: Secondary | ICD-10-CM | POA: Insufficient documentation

## 2022-08-08 DIAGNOSIS — F32A Depression, unspecified: Secondary | ICD-10-CM | POA: Insufficient documentation

## 2022-08-08 DIAGNOSIS — Z62811 Personal history of psychological abuse in childhood: Secondary | ICD-10-CM | POA: Insufficient documentation

## 2022-08-08 DIAGNOSIS — Z62810 Personal history of physical and sexual abuse in childhood: Secondary | ICD-10-CM | POA: Insufficient documentation

## 2022-08-08 DIAGNOSIS — Z634 Disappearance and death of family member: Secondary | ICD-10-CM | POA: Insufficient documentation

## 2022-08-08 DIAGNOSIS — Z8673 Personal history of transient ischemic attack (TIA), and cerebral infarction without residual deficits: Secondary | ICD-10-CM | POA: Insufficient documentation

## 2022-08-08 DIAGNOSIS — F1411 Cocaine abuse, in remission: Secondary | ICD-10-CM | POA: Insufficient documentation

## 2022-08-08 LAB — POC SARS CORONAVIRUS 2 AG: SARSCOV2ONAVIRUS 2 AG: POSITIVE — AB

## 2022-08-08 NOTE — ED Notes (Signed)
Pt's POC covid resulted positive provider made aware.
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
## 2022-08-08 NOTE — Discharge Instructions (Addendum)

## 2022-08-08 NOTE — ED Triage Notes (Signed)
Pt presents to [facility] voluntarily, accompanied by his daughter with complaint of worsening depression and SI, with no plan. Pt reports losing his sister about two months ago and has been dealing with unbearable grief. Pt reports excessive crying, lack of appetite and appropriate sleep. Pt also reports hearing his sister's voice often. Pt is very tearful during triage process. Pt has no previous psychiatric hx. Pt currently denies HI, substance/alcohol use.

## 2022-08-09 ENCOUNTER — Encounter (HOSPITAL_COMMUNITY): Payer: Self-pay | Admitting: Emergency Medicine

## 2022-08-09 ENCOUNTER — Other Ambulatory Visit: Payer: Self-pay

## 2022-08-09 ENCOUNTER — Emergency Department (HOSPITAL_COMMUNITY)
Admission: EM | Admit: 2022-08-09 | Discharge: 2022-08-10 | Disposition: A | Discharge status: Home / Self Care | Payer: Medicaid Other | Attending: Emergency Medicine | Admitting: Emergency Medicine

## 2022-08-09 DIAGNOSIS — F4321 Adjustment disorder with depressed mood: Secondary | ICD-10-CM | POA: Diagnosis not present

## 2022-08-09 DIAGNOSIS — F4322 Adjustment disorder with anxiety: Secondary | ICD-10-CM | POA: Diagnosis not present

## 2022-08-09 DIAGNOSIS — F333 Major depressive disorder, recurrent, severe with psychotic symptoms: Secondary | ICD-10-CM | POA: Diagnosis present

## 2022-08-09 DIAGNOSIS — I110 Hypertensive heart disease with heart failure: Secondary | ICD-10-CM | POA: Insufficient documentation

## 2022-08-09 DIAGNOSIS — F17210 Nicotine dependence, cigarettes, uncomplicated: Secondary | ICD-10-CM | POA: Insufficient documentation

## 2022-08-09 DIAGNOSIS — F323 Major depressive disorder, single episode, severe with psychotic features: Secondary | ICD-10-CM | POA: Diagnosis not present

## 2022-08-09 DIAGNOSIS — R45851 Suicidal ideations: Secondary | ICD-10-CM

## 2022-08-09 DIAGNOSIS — I509 Heart failure, unspecified: Secondary | ICD-10-CM | POA: Diagnosis not present

## 2022-08-09 LAB — CBC WITH DIFFERENTIAL/PLATELET
Abs Immature Granulocytes: 0.01 10*3/uL (ref 0.00–0.07)
Basophils Absolute: 0 10*3/uL (ref 0.0–0.1)
Basophils Relative: 1 %
Eosinophils Absolute: 0 10*3/uL (ref 0.0–0.5)
Eosinophils Relative: 1 %
HCT: 43.6 % (ref 39.0–52.0)
Hemoglobin: 14.8 g/dL (ref 13.0–17.0)
Immature Granulocytes: 0 %
Lymphocytes Relative: 27 %
Lymphs Abs: 1.1 10*3/uL (ref 0.7–4.0)
MCH: 31.5 pg (ref 26.0–34.0)
MCHC: 33.9 g/dL (ref 30.0–36.0)
MCV: 92.8 fL (ref 80.0–100.0)
Monocytes Absolute: 0.9 10*3/uL (ref 0.1–1.0)
Monocytes Relative: 22 %
Neutro Abs: 2.1 10*3/uL (ref 1.7–7.7)
Neutrophils Relative %: 49 %
Platelets: 238 10*3/uL (ref 150–400)
RBC: 4.7 MIL/uL (ref 4.22–5.81)
RDW: 12.9 % (ref 11.5–15.5)
WBC: 4.2 10*3/uL (ref 4.0–10.5)
nRBC: 0 % (ref 0.0–0.2)

## 2022-08-09 LAB — COMPREHENSIVE METABOLIC PANEL
ALT: 18 U/L (ref 0–44)
AST: 16 U/L (ref 15–41)
Albumin: 3.7 g/dL (ref 3.5–5.0)
Alkaline Phosphatase: 77 U/L (ref 38–126)
Anion gap: 7 (ref 5–15)
BUN: 8 mg/dL (ref 6–20)
CO2: 22 mmol/L (ref 22–32)
Calcium: 11 mg/dL — ABNORMAL HIGH (ref 8.9–10.3)
Chloride: 104 mmol/L (ref 98–111)
Creatinine, Ser: 1.02 mg/dL (ref 0.61–1.24)
GFR, Estimated: >60 mL/min (ref >60)
Glucose, Bld: 109 mg/dL — ABNORMAL HIGH (ref 70–99)
Potassium: 3.7 mmol/L (ref 3.5–5.1)
Sodium: 133 mmol/L — ABNORMAL LOW (ref 135–145)
Total Bilirubin: 0.6 mg/dL (ref 0.3–1.2)
Total Protein: 6.8 g/dL (ref 6.5–8.1)

## 2022-08-09 LAB — RAPID URINE DRUG SCREEN, HOSP PERFORMED
Amphetamines: NOT DETECTED
Barbiturates: NOT DETECTED
Benzodiazepines: NOT DETECTED
Cocaine: NOT DETECTED
Opiates: NOT DETECTED
Tetrahydrocannabinol: POSITIVE — AB

## 2022-08-09 LAB — CBG MONITORING, ED: Glucose-Capillary: 93 mg/dL (ref 70–99)

## 2022-08-09 LAB — ETHANOL: Alcohol, Ethyl (B): <10 mg/dL (ref <10)

## 2022-08-09 LAB — SARS CORONAVIRUS 2 BY RT PCR: SARS Coronavirus 2 by RT PCR: POSITIVE — AB

## 2022-08-09 MED ORDER — TRAZODONE HCL 50 MG PO TABS: 50.0000 mg | ORAL_TABLET | Freq: Every evening | ORAL | Status: DC | PRN

## 2022-08-09 MED ORDER — PANTOPRAZOLE SODIUM 40 MG PO TBEC
40.0000 mg | DELAYED_RELEASE_TABLET | Freq: Every day | ORAL | Status: DC
  Administered 2022-08-09 – 2022-08-10 (×2): 40 mg via ORAL
  Filled 2022-08-09 (×2): qty 1

## 2022-08-09 MED ORDER — HYDROXYZINE HCL 25 MG PO TABS: 25.0000 mg | ORAL_TABLET | Freq: Three times a day (TID) | ORAL | Status: DC | PRN

## 2022-08-09 MED ORDER — AMLODIPINE BESYLATE-VALSARTAN 10-160 MG PO TABS: 1.0000 | ORAL_TABLET | Freq: Every day | ORAL | Status: DC

## 2022-08-09 MED ORDER — ONDANSETRON HCL 4 MG PO TABS: 4.0000 mg | ORAL_TABLET | Freq: Three times a day (TID) | ORAL | Status: DC | PRN

## 2022-08-09 MED ORDER — ESCITALOPRAM OXALATE 10 MG PO TABS: 5.0000 mg | ORAL_TABLET | Freq: Every day | ORAL | Status: DC

## 2022-08-09 MED ORDER — RISPERIDONE 1 MG PO TBDP: 2.0000 mg | ORAL_TABLET | Freq: Three times a day (TID) | ORAL | Status: DC | PRN

## 2022-08-09 MED ORDER — IRBESARTAN 300 MG PO TABS
150.0000 mg | ORAL_TABLET | Freq: Every day | ORAL | Status: DC
  Administered 2022-08-09 – 2022-08-10 (×2): 150 mg via ORAL
  Filled 2022-08-09 (×3): qty 1

## 2022-08-09 MED ORDER — ASPIRIN 81 MG PO TBEC
81.0000 mg | DELAYED_RELEASE_TABLET | Freq: Every day | ORAL | Status: DC
  Administered 2022-08-09 – 2022-08-10 (×2): 81 mg via ORAL
  Filled 2022-08-09 (×2): qty 1

## 2022-08-09 MED ORDER — ACETAMINOPHEN 500 MG PO TABS
1000.0000 mg | ORAL_TABLET | Freq: Once | ORAL | Status: DC
  Filled 2022-08-09: qty 2

## 2022-08-09 MED ORDER — EZETIMIBE 10 MG PO TABS
10.0000 mg | ORAL_TABLET | Freq: Every day | ORAL | Status: DC
  Administered 2022-08-10: 10 mg via ORAL
  Filled 2022-08-09 (×2): qty 1

## 2022-08-09 MED ORDER — ZIPRASIDONE MESYLATE 20 MG IM SOLR: 20.0000 mg | INTRAMUSCULAR | Status: DC | PRN

## 2022-08-09 MED ORDER — ACETAMINOPHEN 325 MG PO TABS: 650.0000 mg | ORAL_TABLET | ORAL | Status: DC | PRN

## 2022-08-09 MED ORDER — LORAZEPAM 1 MG PO TABS: 1.0000 mg | ORAL_TABLET | ORAL | Status: DC | PRN

## 2022-08-09 MED ORDER — CLOPIDOGREL BISULFATE 75 MG PO TABS
75.0000 mg | ORAL_TABLET | Freq: Every day | ORAL | Status: DC
  Administered 2022-08-09 – 2022-08-10 (×2): 75 mg via ORAL
  Filled 2022-08-09 (×2): qty 1

## 2022-08-09 MED ORDER — NICOTINE 21 MG/24HR TD PT24: 21.0000 mg | MEDICATED_PATCH | TRANSDERMAL | Status: DC

## 2022-08-09 MED ORDER — AMLODIPINE BESYLATE 5 MG PO TABS
10.0000 mg | ORAL_TABLET | Freq: Every day | ORAL | Status: DC
  Administered 2022-08-09 – 2022-08-10 (×2): 10 mg via ORAL
  Filled 2022-08-09 (×2): qty 2

## 2022-08-09 MED ORDER — ATORVASTATIN CALCIUM 80 MG PO TABS
80.0000 mg | ORAL_TABLET | Freq: Every day | ORAL | Status: DC
  Administered 2022-08-09 – 2022-08-10 (×2): 80 mg via ORAL
  Filled 2022-08-09: qty 1
  Filled 2022-08-09: qty 2

## 2022-08-09 NOTE — ED Triage Notes (Signed)
Patient reports suicidal ideation but did not disclose his plan at triage . No hallucinations .

## 2022-08-09 NOTE — ED Provider Triage Note (Addendum)
Emergency Medicine Provider Triage Evaluation Note  Charles Callahan , a 51 y.o. male  was evaluated in triage.  Pt complains of SI and depression.  Seen at [facility] yesterday, tested + for COVID so was discharged with OP resources.  Patient states he simply cannot do this on his own and he needs treatment.  He reports SI/HI but no plan or attempted self harm recently.  Denies AVH.  Review of Systems  Positive: SI/HI, depression Negative: fever  Physical Exam  BP (!) 179/106 (BP Location: Right Arm)   Pulse 93   Temp (!) 100.4 F (38 C) (Oral)   Resp 18   SpO2 97%  Gen:   Awake, no distress   Resp:  Normal effort  MSK:   Moves extremities without difficulty  Other:  SI/HI without plan voiced  Medical Decision Making  Medically screening exam initiated at 1:14 AM.  Appropriate orders placed.  Charles Callahan was informed that the remainder of the evaluation will be completed by another provider, this initial triage assessment does not replace that evaluation, and the importance of remaining in the ED until their evaluation is complete.  SI/HI.  At [facility] yesterday for same, discharge with OP resources after testing + for COVID.  Returns due to safety concerns.  Labs sent.  Febrile in triage, likely due to known covid.  Will give tylenol.   Charles Pickett, PA-C 08/09/22 0117    Charles Pickett, PA-C 08/09/22 [phone]

## 2022-08-09 NOTE — Consult Note (Signed)
Why ED ASSESSMENT   Reason for Consult:  Psychiatric Consult r/t suicidal ideations  Referring Physician:   Dr. Varney Biles, MD Patient Identification: Charles Callahan MRN:  [id number] ED Chief Complaint: Severe major depression, single episode, with psychotic features ([place])  Diagnosis:  Principal Problem:   Severe major depression, single episode, with psychotic features ([place]) Active Problems:   Complicated grief   Adjustment reaction with anxiety   ED Assessment Time Calculation: Start Time: 0330 Stop Time: 0400 Total Time in Minutes (Assessment Completion): 30   Subjective:   Charles Callahan is a 51 y.o. male patient admitted to [facility], 08/08/2022, voluntarily due to worsening suicidal ideations. Patient initially admitted to [facility], however tested positive for COVID and continued to experience intrusive suicidal ideations and hearing the voice of his sister who recently died.  HPI: Charles Callahan is a 51 y.o. male that initially presented to [facility] 08/08/2022 due to worsening depression, suicidal ideations, and hearing voices (deceased sister).  Patient was going to be admitted to the continuous assessment unit for overnight monitoring however subsequently tested positive for COVID.  Patient was discharged from [facility] with ER precautions if his symptoms worsen.  Shortly after leaving [facility] patient experiencing more intrusive thoughts of killing himself and and states" I thought of several ways to do it", however would not elaborate but states he has never attempted to kill himself or inflict any self-harm.  He reports the symptoms started back in Jun 20, 2023 when his sister passed away and in 2023-07-21 he subsequently had a stroke and reports some mild deficits such as short-term memory loss, lower extremity weakness, and frequent episodes of dizziness.  He reports since the stroke his symptoms of depression and hopelessness has worsened with the severity of his suicidal ideations occurring over the past few  weeks.  Patient reports he is employed at [employer] and suspects this is likely where he acquired COVID-19 virus.  Patient endorses that his main person of support is his daughter.  He reports that his mother is alive however they do not have a good relationship.  He reports his father passed away in 02-17-2018. He lives alone and endorses the SI was worsening over the last few days which frightened him and caused him to see help.  Patient denies any prior history of depression, psychosis, or any previously diagnosed mental health conditions.  Patient has never been inpatient for any mental health related causes.  Patient denies any illicit drug use however UDS was positive for THC.  Patient would like to start a medication that will help with his depressive symptoms and his times of anxiousness.  He also endorses he has not been sleeping very well over the last several weeks.  He currently does not take any medication for sleep.   During evaluation Charles Callahan is lying in bed. He displays in no acute distress.  He is alert, oriented x 4, calm, cooperative and attentive.  His mood is depressed with congruent affect.  He has normal speech, and behavior.  Objectively there is no evidence of psychosis/mania or delusional thinking.  Patient is able to converse coherently, goal directed thoughts, no distractibility, or pre-occupation.  He continues to endorses suicidal ideations, although is not providing details pertaining to a specific plan.       Past Psychiatric History:  No prior psychiatric history, diagnosis, or prior treatment  Risk to Self or Others: Is the patient at risk to self? Yes Has the patient been a risk  to self in the past 6 months? Yes Has the patient been a risk to self within the distant past? No Is the patient a risk to others? No Has the patient been a risk to others in the past 6 months? No Has the patient been a risk to others within the distant past? No  [place] Scale:   Flowsheet Row ED from 08/09/2022 in [facility] EMERGENCY DEPARTMENT ED from 08/08/2022 in [facility] ED to Hosp-Admission (Discharged) from 07/16/2022 in [facility]  C-SSRS RISK CATEGORY High Risk Low Risk No Risk      Past Medical History:  Past Medical History:  Diagnosis Date   CHF (congestive heart failure) (HCC)    Hypertension    Stroke [facility])     Past Surgical History:  Procedure Laterality Date   EYE SURGERY     Family History:  Family History  Problem Relation Age of Onset   Diabetes Mother    Hypertension Mother    Social History:  Social History   Substance and Sexual Activity  Alcohol Use Yes   Comment: rare     Social History   Substance and Sexual Activity  Drug Use Yes   Types: Cocaine    Social History   Socioeconomic History   Marital status: Married    Spouse name: Not on file   Number of children: Not on file   Years of education: Not on file   Highest education level: Not on file  Occupational History   Not on file  Tobacco Use   Smoking status: Every Day    Packs/day: 1.00    Types: Cigarettes   Smokeless tobacco: Never  Vaping Use   Vaping Use: Former  Substance and Sexual Activity   Alcohol use: Yes    Comment: rare   Drug use: Yes    Types: Cocaine   Sexual activity: Yes  Other Topics Concern   Social Determinants of Health   Financial Resource Strain: Not on file  Food Insecurity: Food Insecurity Present (03/01/2021)   Hunger Vital Sign    Worried About Running Out of Food in the Last Year: Sometimes true    Ran Out of Food in the Last Year: Sometimes true  Transportation Needs: Unmet Transportation Needs (03/01/2021)   PRAPARE - Administrator, Civil Service (Medical): Yes    Lack of Transportation (Non-Medical): Yes  Physical Activity: Not on file  Stress: Not on file  Social Connections: Not on file       Allergies:   Allergies   Allergen Reactions   Morphine And Related Nausea And Vomiting    Labs:  Results for orders placed or performed during the hospital encounter of 08/09/22 (from the past 48 hour(s))  CBC with Differential     Status: None   Collection Time: 08/09/22  1:19 AM  Result Value Ref Range   WBC 4.2 4.0 - 10.5 K/uL   RBC 4.70 4.22 - 5.81 MIL/uL   Hemoglobin 14.8 13.0 - 17.0 g/dL   HCT 12.8 11.8 - 86.7 %   MCV 92.8 80.0 - 100.0 fL   MCH 31.5 26.0 - 34.0 pg   MCHC 33.9 30.0 - 36.0 g/dL   RDW 73.7 36.6 - 81.5 %   Platelets 238 150 - 400 K/uL   nRBC 0.0 0.0 - 0.2 %   Neutrophils Relative % 49 %   Neutro Abs 2.1 1.7 - 7.7 K/uL  Lymphocytes Relative 27 %   Lymphs Abs 1.1 0.7 - 4.0 K/uL   Monocytes Relative 22 %   Monocytes Absolute 0.9 0.1 - 1.0 K/uL   Eosinophils Relative 1 %   Eosinophils Absolute 0.0 0.0 - 0.5 K/uL   Basophils Relative 1 %   Basophils Absolute 0.0 0.0 - 0.1 K/uL   Immature Granulocytes 0 %   Abs Immature Granulocytes 0.01 0.00 - 0.07 K/uL    Comment: Performed at [facility] [address], [place], [place] [address]  Comprehensive metabolic panel     Status: Abnormal   Collection Time: 08/09/22  1:19 AM  Result Value Ref Range   Sodium 133 (L) 135 - 145 mmol/L   Potassium 3.7 3.5 - 5.1 mmol/L   Chloride 104 98 - 111 mmol/L   CO2 22 22 - 32 mmol/L   Glucose, Bld 109 (H) 70 - 99 mg/dL    Comment: Glucose reference range applies only to samples taken after fasting for at least 8 hours.   BUN 8 6 - 20 mg/dL   Creatinine, Ser 1.02 0.61 - 1.24 mg/dL   Calcium 11.0 (H) 8.9 - 10.3 mg/dL   Total Protein 6.8 6.5 - 8.1 g/dL   Albumin 3.7 3.5 - 5.0 g/dL   AST 16 15 - 41 U/L   ALT 18 0 - 44 U/L   Alkaline Phosphatase 77 38 - 126 U/L   Total Bilirubin 0.6 0.3 - 1.2 mg/dL   GFR, Estimated >60 >60 mL/min    Comment: (NOTE) Calculated using the CKD-EPI Creatinine Equation (2021)    Anion gap 7 5 - 15    Comment: Performed at [facility] [address] [place], [place] [address]  Ethanol     Status: None   Collection Time: 08/09/22  1:20 AM  Result Value Ref Range   Alcohol, Ethyl (B) <10 <10 mg/dL    Comment: (NOTE) Lowest detectable limit for serum alcohol is 10 mg/dL.  For medical purposes only. Performed at [facility], [place] [address], [place], [place] [address]   Rapid urine drug screen (hospital performed)     Status: Abnormal   Collection Time: 08/09/22  1:21 AM  Result Value Ref Range   Opiates NONE DETECTED NONE DETECTED   Cocaine NONE DETECTED NONE DETECTED   Benzodiazepines NONE DETECTED NONE DETECTED   Amphetamines NONE DETECTED NONE DETECTED   Tetrahydrocannabinol POSITIVE (A) NONE DETECTED   Barbiturates NONE DETECTED NONE DETECTED    Comment: (NOTE) DRUG SCREEN FOR MEDICAL PURPOSES ONLY.  IF CONFIRMATION IS NEEDED FOR ANY PURPOSE, NOTIFY LAB WITHIN 5 DAYS.  LOWEST DETECTABLE LIMITS FOR URINE DRUG SCREEN Drug Class                     Cutoff (ng/mL) Amphetamine and metabolites    1000 Barbiturate and metabolites    200 Benzodiazepine                 200 Opiates and metabolites        300 Cocaine and metabolites        300 THC                            50 Performed at [facility], [place] [address] [place], [place] [address]     Current Facility-Administered Medications  Medication Dose Route Frequency Provider Last Rate Last Admin  acetaminophen (TYLENOL) tablet 1,000 mg  1,000 mg Oral Once Quincy Carnes M, PA-C       acetaminophen (TYLENOL) tablet 650 mg  650 mg Oral Q4H PRN Varney Biles, MD       amLODipine (NORVASC) tablet 10 mg  10 mg Oral Daily Kathrynn Humble, Ankit, MD   10 mg at 08/09/22 1623   And   irbesartan (AVAPRO) tablet 150 mg  150 mg Oral Daily Varney Biles, MD   150 mg at 08/09/22 1623   aspirin EC tablet 81 mg  81 mg Oral Daily Nanavati, Ankit, MD   81 mg at 08/09/22 1541   atorvastatin (LIPITOR) tablet 80 mg  80 mg Oral Daily Varney Biles, MD   80 mg at 08/09/22 1542    clopidogrel (PLAVIX) tablet 75 mg  75 mg Oral Daily Varney Biles, MD   75 mg at 08/09/22 1541   escitalopram (LEXAPRO) tablet 5 mg  5 mg Oral QHS Scot Jun, FNP       ezetimibe (ZETIA) tablet 10 mg  10 mg Oral Daily Nanavati, Ankit, MD       hydrOXYzine (ATARAX) tablet 25 mg  25 mg Oral TID PRN Scot Jun, FNP       risperiDONE (RISPERDAL M-TABS) disintegrating tablet 2 mg  2 mg Oral Q8H PRN Kathrynn Humble, Ankit, MD       And   LORazepam (ATIVAN) tablet 1 mg  1 mg Oral PRN Kathrynn Humble, Ankit, MD       And   ziprasidone (GEODON) injection 20 mg  20 mg Intramuscular PRN Nanavati, Ankit, MD       nicotine (NICODERM CQ - dosed in mg/24 hours) patch 21 mg  21 mg Transdermal Q24H Nanavati, Ankit, MD       ondansetron (ZOFRAN) tablet 4 mg  4 mg Oral Q8H PRN Nanavati, Ankit, MD       pantoprazole (PROTONIX) EC tablet 40 mg  40 mg Oral Daily Nanavati, Ankit, MD   40 mg at 08/09/22 1541   traZODone (DESYREL) tablet 50 mg  50 mg Oral QHS PRN Scot Jun, FNP       Current Outpatient Medications  Medication Sig Dispense Refill   amLODipine-valsartan (EXFORGE) 10-160 MG tablet Take 1 tablet by mouth daily. 30 tablet 1   aspirin EC 81 MG tablet Take 1 tablet (81 mg total) by mouth daily. Swallow whole. 30 tablet 12   atorvastatin (LIPITOR) 80 MG tablet Take 1 tablet (80 mg total) by mouth daily. 30 tablet 3   clopidogrel (PLAVIX) 75 MG tablet Take 1 tablet (75 mg total) by mouth daily. 30 tablet 0   ezetimibe (ZETIA) 10 MG tablet Take 1 tablet (10 mg total) by mouth daily. 30 tablet 0   nicotine (NICODERM CQ - DOSED IN MG/24 HOURS) 21 mg/24hr patch Place 1 patch (21 mg total) onto the skin daily. 30 patch 1   pantoprazole (PROTONIX) 40 MG tablet Take 1 tablet (40 mg total) by mouth daily. (Patient not taking: Reported on 07/17/2022) 30 tablet 2     Psychiatric Specialty Exam: Presentation  General Appearance:  Appropriate for Environment  Eye Contact: Fair  Speech: Slow;  Clear and Coherent  Speech Volume: Decreased  Handedness: Right   Mood and Affect  Mood: Depressed  Affect: Depressed   Thought Process  Thought Processes: Coherent  Descriptions of Associations:Intact  Orientation:Full (Time, Place and Person)  Thought Content:Tangential  History of Schizophrenia/Schizoaffective disorder:No  Duration of Psychotic Symptoms:Less than six months  Hallucinations:Hallucinations: Auditory Description of Auditory Hallucinations: Voice of his sister that recently died Description of Visual Hallucinations: none  Ideas of Reference:None  Suicidal Thoughts:Suicidal Thoughts: Yes, Active SI Active Intent and/or Plan: With Plan (Patient will not specify specific plan but endorses he has a plan) SI Passive Intent and/or Plan: Without Intent; Without Plan  Homicidal Thoughts:Homicidal Thoughts: No   Sensorium  Memory: Immediate Fair; Recent Fair; Remote Fair  Judgment: Fair  Insight: Fair   Materials engineer: Fair  Attention Span: Fair  Recall: [employer] of Knowledge: Fair  Language: Fair   Psychomotor Activity  Psychomotor Activity: Psychomotor Activity: Normal   Assets  Assets: Communication Skills; Financial Resources/Insurance; Housing; Desire for Improvement; Social Support; Talents/Skills; Vocational/Educational    Sleep  Sleep: Sleep: Poor Number of Hours of Sleep: 0 (few hours intermittently)   Physical Exam: Physical Exam HENT:     Head: Normocephalic.     Nose: Nose normal.  Eyes:     Extraocular Movements: Extraocular movements intact.     Pupils: Pupils are equal, round, and reactive to light.  Cardiovascular:     Rate and Rhythm: Normal rate and regular rhythm.  Pulmonary:     Effort: Pulmonary effort is normal.     Breath sounds: Normal breath sounds.  Musculoskeletal:        General: Normal range of motion.     Cervical back: Normal range of motion.  Skin:     General: Skin is warm.     Capillary Refill: Capillary refill takes less than 2 seconds.  Neurological:     General: No focal deficit present.     Mental Status: He is alert.  Psychiatric:        Mood and Affect: Mood normal.    Review of Systems  Constitutional:  Positive for fever.  Psychiatric/Behavioral:  Positive for depression, hallucinations and suicidal ideas. The patient is nervous/anxious and has insomnia.    Blood pressure (!) 162/99, pulse 94, temperature 100.3 F (37.9 C), resp. rate 13, SpO2 97 %. There is no height or weight on file to calculate BMI.  Medical Decision Making: Patient case review and discussed with Dr. Dwyane Dee. Patient does meet inpatient criteria, however due to current COVID-19 diagnosis , he is unable to transfer to any inpatient facility for at least 10 days. Patient is unable to contract for safety. Discussed with patient observation here in the ED. Agrees to start medications to for depression management, anxiety,and to improve sleep. Re-evaluate for ability to contract for safety and depression with SI in AM. EDP, RN, LCSW, notified of disposition.   MDD with psychotic features- Start Lexapro 5 mg at bedtime. Antipsychotics Geodon and Risperdal PRN for severe agitation. 2. Adjustment disorder with anxiety-Hydroxyzine 25 mg TID PRN 3. Complicated Grief, will benefit from outpatient grief counseling   Disposition:  Re-evaluate 08/10/22 for appropriateness for discharge or continued ED observation.   Molli Barrows, FNP-C, PMHNP-BC 08/09/2022 4:35 PM

## 2022-08-09 NOTE — ED Notes (Signed)
Pt is voluntary, form is connected to clipboard in green zone.

## 2022-08-09 NOTE — ED Provider Notes (Signed)
[facility] EMERGENCY DEPARTMENT Provider Note   CSN: [id number] Arrival date & time: 08/09/22  0053     History  Chief Complaint  Patient presents with   Suicidal    Covid+    Charles Callahan is a 51 y.o. male.  HPI     51 year old male comes in with chief complaint of suicidal ideation. Patient states has been having suicidal thoughts and depression for the last few days.  He was seen earlier at behavioral health urgent care and was discharged, however he continues to feel horrible at home, does not feel that he can recover by himself and came to the emergency room.  I called patient's daughter at his request.  Charles Callahan tells me that patient was taken to be hugged by her yesterday as patient broke down in front of her and told her that he was hearing voices and was having suicidal thoughts.  Patient does not carry a psychiatry diagnosis.  Patient was diagnosed with COVID-19 while at the behavioral health urgent care.  They were told by the provider there that since he has COVID-19, they cannot transfer him to the behavioral health hospital by [facility] long, and therefore he was discharged.  They told him that if patient gets worse to come to the emergency room.  Within minutes of getting home, patient continued to express feeling suicidal and hearing voices and so family called police and patient was brought to the emergency room at [facility].  Patient states that he been having runny nose for the last 2 weeks.   Home Medications Prior to Admission medications   Medication Sig Start Date End Date Taking? Authorizing Provider  amLODipine-valsartan (EXFORGE) 10-160 MG tablet Take 1 tablet by mouth daily. 07/28/22 09/26/22  Adron Bene, MD  aspirin EC 81 MG tablet Take 1 tablet (81 mg total) by mouth daily. Swallow whole. 07/18/22   Katsadouros, Vasilios, MD  atorvastatin (LIPITOR) 80 MG tablet Take 1 tablet (80 mg total) by mouth daily. 07/28/22   Adron Bene, MD  clopidogrel (PLAVIX) 75 MG tablet Take 1 tablet (75 mg total) by mouth daily. 07/18/22   Katsadouros, Vasilios, MD  ezetimibe (ZETIA) 10 MG tablet Take 1 tablet (10 mg total) by mouth daily. 07/18/22   Katsadouros, Vasilios, MD  nicotine (NICODERM CQ - DOSED IN MG/24 HOURS) 21 mg/24hr patch Place 1 patch (21 mg total) onto the skin daily. 07/28/22 09/26/22  Adron Bene, MD  pantoprazole (PROTONIX) 40 MG tablet Take 1 tablet (40 mg total) by mouth daily. Patient not taking: Reported on 07/17/2022 04/16/21   Anders Simmonds, PA-C  lisinopril-hydrochlorothiazide (PRINZIDE,ZESTORETIC) 20-25 MG per tablet Take 1 tablet by mouth daily. 06/14/15 02/16/21  Henrietta Hoover, NP      Allergies    Morphine and related    Review of Systems   Review of Systems  All other systems reviewed and are negative.   Physical Exam Updated Vital Signs BP (!) 162/99 (BP Location: Left Arm)   Pulse 94   Temp 100.3 F (37.9 C)   Resp 13   SpO2 97%  Physical Exam Vitals and nursing note reviewed.  Constitutional:      Appearance: He is well-developed.  HENT:     Head: Atraumatic.  Cardiovascular:     Rate and Rhythm: Normal rate.  Pulmonary:     Effort: Pulmonary effort is normal.  Musculoskeletal:     Cervical back: Neck supple.  Skin:    General: Skin  is warm.  Neurological:     Mental Status: He is alert and oriented to person, place, and time.  Psychiatric:        Mood and Affect: Mood normal.        Behavior: Behavior normal.     ED Results / Procedures / Treatments   Labs (all labs ordered are listed, but only abnormal results are displayed) Labs Reviewed  COMPREHENSIVE METABOLIC PANEL - Abnormal; Notable for the following components:      Result Value   Sodium 133 (*)    Glucose, Bld 109 (*)    Calcium 11.0 (*)    All other components within normal limits  RAPID URINE DRUG SCREEN, HOSP PERFORMED - Abnormal; Notable for the following components:    Tetrahydrocannabinol POSITIVE (*)    All other components within normal limits  CBC WITH DIFFERENTIAL/PLATELET  ETHANOL    EKG None  Radiology No results found.  Procedures Procedures    Medications Ordered in ED Medications  acetaminophen (TYLENOL) tablet 1,000 mg (has no administration in time range)  risperiDONE (RISPERDAL M-TABS) disintegrating tablet 2 mg (has no administration in time range)    And  LORazepam (ATIVAN) tablet 1 mg (has no administration in time range)    And  ziprasidone (GEODON) injection 20 mg (has no administration in time range)  ondansetron (ZOFRAN) tablet 4 mg (has no administration in time range)  acetaminophen (TYLENOL) tablet 650 mg (has no administration in time range)  amLODipine-valsartan (EXFORGE) 10-160 MG per tablet 1 tablet (has no administration in time range)  aspirin EC tablet 81 mg (has no administration in time range)  atorvastatin (LIPITOR) tablet 80 mg (has no administration in time range)  clopidogrel (PLAVIX) tablet 75 mg (has no administration in time range)  ezetimibe (ZETIA) tablet 10 mg (has no administration in time range)  nicotine (NICODERM CQ - dosed in mg/24 hours) patch 21 mg (has no administration in time range)  pantoprazole (PROTONIX) EC tablet 40 mg (has no administration in time range)    ED Course/ Medical Decision Making/ A&P                           Medical Decision Making Risk OTC drugs. Prescription drug management.   This patient presents to the ED with chief complaint(s) of suicidal ideation, hearing voices with no primary psychiatric diagnosis.The complaint involves an extensive differential diagnosis and also carries with it a high risk of complications and morbidity.    The differential diagnosis includes -underlying psychiatry conditions such as schizophrenia, bipolar depression.  History not indicative of substance use disorder, but toxic effects from drug use or withdrawals also  considered.  Patient has no focal neurodeficits.  He is denying any headaches.  No need for CT head at this time.  Patient is COVID-positive.  He is symptomatic in form of low fever and runny nose.  Patient is medically cleared for psychiatric evaluation.  Additional history obtained: Additional history obtained from  patient's daughter who provides substantial history Records reviewed  behavioral health urgent care records, it appears that patient was discharged yesterday night  Independent labs interpretation:  The following labs were independently interpreted: COVID-19 was positive yesterday.  Labs today are reassuring otherwise  We have consulted psychiatry services for further management.  Patient is medically cleared for psychiatric evaluation and management.  Final Clinical Impression(s) / ED Diagnoses Final diagnoses:  Suicidal ideation    Rx / DC Orders ED  Discharge Orders     None         Varney Biles, MD 08/09/22 [phone]

## 2022-08-10 DIAGNOSIS — F323 Major depressive disorder, single episode, severe with psychotic features: Secondary | ICD-10-CM

## 2022-08-10 MED ORDER — ESCITALOPRAM OXALATE 5 MG PO TABS: 5.0000 mg | ORAL_TABLET | Freq: Every day | ORAL | 0 refills | Status: DC

## 2022-08-10 NOTE — ED Notes (Signed)
Pt given back his belongings. Awaiting daughter to arrive for discharge.

## 2022-08-10 NOTE — ED Provider Notes (Signed)
Emergency Medicine Observation Re-evaluation Note  Charles Callahan is a 51 y.o. male, seen on rounds today.  Pt initially presented to the ED for complaints of Suicidal (Covid+) Currently, the patient is resting.  Physical Exam  BP (!) 133/94 (BP Location: Left Arm)   Pulse 82   Temp 99 F (37.2 C) (Oral)   Resp 16   SpO2 99%  Physical Exam General: NAD Cardiac: well perfused Lungs: even and unlabored Psych: no agitation  ED Course / MDM  EKG:   I have reviewed the labs performed to date as well as medications administered while in observation.  Recent changes in the last 24 hours include psychiatric consultation.  Plan  Current plan is for psychiatric consultation.  Pt was cleared for discharge by Vesta Mixer, NP of psychiatry. Pt was here voluntarily, given resources for outpatient use. Of note, patient is symptomatic mildly with COVID 19 for two weeks, outside the window for Paxlovid.    Regan Lemming, MD 08/10/22 1013

## 2022-08-10 NOTE — ED Notes (Signed)
Pt psych cleared per Vesta Mixer NP.   Dr. Armandina Gemma updated on pt's status.   Pt given back all of his belongings and awaiting discharge paperwork.

## 2022-08-10 NOTE — Discharge Instructions (Signed)
  Outpatient psychiatric Services  Walk in hours for medication management Monday, Wednesday, Thursday, and Friday from 8:00 AM to 11:00 AM Recommend arriving by by 7:30 AM.  It is first come first serve.    Walk in hours for therapy intake Monday and Wednesday only 8:00 AM to 11:00 AM Encouraged to arrive by 7:30 AM.  It is first come first serve   Inpatient patient psychiatric services [facility] offers comprehensive behavioral [place] [facility] services for mental health and substance abuse treatment.  Social work can also assist with referral to or getting you into a rehabilitation program short or long term    

## 2022-08-10 NOTE — Discharge Summary (Addendum)
[facility] Psych ED Discharge  08/10/2022 10:06 AM Charles Callahan  MRN:  [id number]  Principal Problem: Severe major depression, single episode, with psychotic features [facility]) Discharge Diagnoses: Principal Problem:   Severe major depression, single episode, with psychotic features (HCC) Active Problems:   Complicated grief   Adjustment reaction with anxiety  Clinical Impression:  Final diagnoses:  Suicidal ideation   Subjective:  Patient seen this morning at [facility], ED for face-to-face reevaluation.  Patient tells me he slept well last night and has no problems with appetite.  This was our first interaction so he explained his recent stressors of having a stroke that caused some life changes for him, his sister recently passing in September, and having a hard time with the grief.  He tells me he has felt more depressed recently.  He had some vague suicidal ideations yesterday, no plan or intent.  Today he tells me he feels well, denies any suicidal or homicidal ideations.  He mentions different reasons for living such as his daughter who he is very close with and feels like is a good support system for him.  He has no previous suicide attempts and tells me he does not want to end his life, he knows he has a lot to live for.  He denies any auditory or visual hallucinations.  We spoke about his cannabis use, and I encouraged him to discontinue his marijuana use.  Explained how THC can exacerbate any symptoms such as paranoia, hallucinations, or depression/anxiety. He did acknowledge this information and agreed to discontinue. Patient is able to contract for safety and is wanting to return home today. He denies any access to weapons or firearms. He has good safety planning in place, and knows he can represent to [facility] or ED if he finds himself in crisis again. Also spoke to him about resources for establishing OP therapy and med management, as he could greatly benefit from grief focused counseling. Pt was  started on Lexapro 5mg  yesterday, and denies any adverse effects today.   Pt is able to engage in coherent and logical conversation. No evidence of RTIS, no concerns of psychosis or mania at this time. Speech is normal in rate and tone. Thought content linear and intact. Agreed to send 14 days of Lexapro to preferred pharmacy to give patient time to acquire psychiatric OP care. Will psychiatrically clear patient at this time.   ED Assessment Time Calculation: Start Time: 0900 Stop Time: 0930 Total Time in Minutes (Assessment Completion): 30   Past Psychiatric History:  MDD, anxiety  Past Medical History:  Past Medical History:  Diagnosis Date   CHF (congestive heart failure) (HCC)    Hypertension    Stroke [facility])     Past Surgical History:  Procedure Laterality Date   EYE SURGERY     Family History:  Family History  Problem Relation Age of Onset   Diabetes Mother    Hypertension Mother   Social History:  Social History   Substance and Sexual Activity  Alcohol Use Yes   Comment: rare     Social History   Substance and Sexual Activity  Drug Use Yes   Types: Cocaine    Social History   Socioeconomic History   Marital status: Married    Spouse name: Not on file   Number of children: Not on file   Years of education: Not on file   Highest education level: Not on file  Occupational History   Not on file  Tobacco Use   Smoking status: Every Day    Packs/day: 1.00    Types: Cigarettes   Smokeless tobacco: Never  Vaping Use   Vaping Use: Former  Substance and Sexual Activity   Alcohol use: Yes    Comment: rare   Drug use: Yes    Types: Cocaine   Sexual activity: Yes  Other Topics Concern   Not on file  Social History Narrative   ** Merged History Encounter **       Social Determinants of Health   Financial Resource Strain: Not on file  Food Insecurity: Food Insecurity Present (03/01/2021)   Hunger Vital Sign    Worried About Running Out of Food in the  Last Year: Sometimes true    Ran Out of Food in the Last Year: Sometimes true  Transportation Needs: Unmet Transportation Needs (03/01/2021)   PRAPARE - Administrator, Civil Service (Medical): Yes    Lack of Transportation (Non-Medical): Yes  Physical Activity: Not on file  Stress: Not on file  Social Connections: Not on file    Tobacco Cessation:  A prescription for an FDA-approved tobacco cessation medication was offered at discharge and the patient refused  Current Medications: Current Facility-Administered Medications  Medication Dose Route Frequency Provider Last Rate Last Admin   acetaminophen (TYLENOL) tablet 1,000 mg  1,000 mg Oral Once Sharilyn Sites M, PA-C       acetaminophen (TYLENOL) tablet 650 mg  650 mg Oral Q4H PRN Rhunette Croft, Ankit, MD       amLODipine (NORVASC) tablet 10 mg  10 mg Oral Daily Rhunette Croft, Ankit, MD   10 mg at 08/10/22 1001   And   irbesartan (AVAPRO) tablet 150 mg  150 mg Oral Daily Rhunette Croft, Ankit, MD   150 mg at 08/10/22 1001   aspirin EC tablet 81 mg  81 mg Oral Daily Nanavati, Ankit, MD   81 mg at 08/10/22 0959   atorvastatin (LIPITOR) tablet 80 mg  80 mg Oral Daily Rhunette Croft, Ankit, MD   80 mg at 08/10/22 1000   clopidogrel (PLAVIX) tablet 75 mg  75 mg Oral Daily Rhunette Croft, Ankit, MD   75 mg at 08/10/22 1000   escitalopram (LEXAPRO) tablet 5 mg  5 mg Oral QHS Bing Neighbors, FNP       ezetimibe (ZETIA) tablet 10 mg  10 mg Oral Daily Rhunette Croft, Ankit, MD   10 mg at 08/10/22 1001   hydrOXYzine (ATARAX) tablet 25 mg  25 mg Oral TID PRN Bing Neighbors, FNP       risperiDONE (RISPERDAL M-TABS) disintegrating tablet 2 mg  2 mg Oral Q8H PRN Rhunette Croft, Ankit, MD       And   LORazepam (ATIVAN) tablet 1 mg  1 mg Oral PRN Rhunette Croft, Ankit, MD       And   ziprasidone (GEODON) injection 20 mg  20 mg Intramuscular PRN Nanavati, Ankit, MD       nicotine (NICODERM CQ - dosed in mg/24 hours) patch 21 mg  21 mg Transdermal Q24H Nanavati, Ankit, MD        ondansetron (ZOFRAN) tablet 4 mg  4 mg Oral Q8H PRN Nanavati, Ankit, MD       pantoprazole (PROTONIX) EC tablet 40 mg  40 mg Oral Daily Nanavati, Ankit, MD   40 mg at 08/10/22 1001   traZODone (DESYREL) tablet 50 mg  50 mg Oral QHS PRN Bing Neighbors, FNP       Current Outpatient Medications  Medication Sig Dispense Refill   amLODipine-valsartan (EXFORGE) 10-160 MG tablet Take 1 tablet by mouth daily. 30 tablet 1   aspirin EC 81 MG tablet Take 1 tablet (81 mg total) by mouth daily. Swallow whole. 30 tablet 12   atorvastatin (LIPITOR) 80 MG tablet Take 1 tablet (80 mg total) by mouth daily. 30 tablet 3   clopidogrel (PLAVIX) 75 MG tablet Take 1 tablet (75 mg total) by mouth daily. 30 tablet 0   ezetimibe (ZETIA) 10 MG tablet Take 1 tablet (10 mg total) by mouth daily. 30 tablet 0   nicotine (NICODERM CQ - DOSED IN MG/24 HOURS) 21 mg/24hr patch Place 1 patch (21 mg total) onto the skin daily. (Patient not taking: Reported on 08/09/2022) 30 patch 1   pantoprazole (PROTONIX) 40 MG tablet Take 1 tablet (40 mg total) by mouth daily. (Patient not taking: Reported on 07/17/2022) 30 tablet 2   PTA Medications: (Not in a hospital admission)   [place] Scale:  [place] ED from 08/09/2022 in [place] ED from 08/08/2022 in [facility] ED to Hosp-Admission (Discharged) from 07/16/2022 in [facility]  C-SSRS RISK CATEGORY High Risk Low Risk No Risk      Psychiatric Specialty Exam: Presentation  General Appearance:  Appropriate for Environment  Eye Contact: Good  Speech: Clear and Coherent  Speech Volume: Normal  Handedness: Right   Mood and Affect  Mood: Euthymic  Affect: Congruent   Thought Process  Thought Processes: Coherent  Descriptions of Associations:Intact  Orientation:Full (Time, Place and Person)  Thought Content:Logical  History of Schizophrenia/Schizoaffective  disorder:No  Duration of Psychotic Symptoms:N/A  Hallucinations:Hallucinations: None Description of Auditory Hallucinations: Voice of his sister that recently died Description of Visual Hallucinations: none  Ideas of Reference:None  Suicidal Thoughts:Suicidal Thoughts: No SI Active Intent and/or Plan: With Plan (Patient will not specify specific plan but endorses he has a plan)  Homicidal Thoughts:Homicidal Thoughts: No   Sensorium  Memory: Immediate Good; Recent Good  Judgment: Fair  Insight: Good   Executive Functions  Concentration: Good  Attention Span: Good  Recall: Good  Fund of Knowledge: Good  Language: Good   Psychomotor Activity  Psychomotor Activity: Psychomotor Activity: Normal   Assets  Assets: Communication Skills; Desire for Improvement; Physical Health; Resilience; Social Support   Sleep  Sleep: Sleep: Fair Number of Hours of Sleep: 0 (few hours intermittently)    Physical Exam: Physical Exam Neurological:     Mental Status: He is alert and oriented to person, place, and time.  Psychiatric:        Attention and Perception: Attention normal.        Mood and Affect: Mood normal.        Speech: Speech normal.        Behavior: Behavior is cooperative.        Thought Content: Thought content normal.        Cognition and Memory: Cognition normal.    Review of Systems  Psychiatric/Behavioral:  Positive for depression. The patient is nervous/anxious.        Grief   All other systems reviewed and are negative.  Blood pressure (!) 153/94, pulse 78, temperature 98.3 F (36.8 C), resp. rate 14, SpO2 97 %. There is no height or weight on file to calculate BMI.   Demographic Factors:  Male and Living alone  Loss Factors: Loss of significant relationship and Decline in physical health  Historical Factors: NA  Risk Reduction Factors:   Sense of responsibility to family, Employed, Positive social support, and Positive coping  skills or problem solving skills  Continued Clinical Symptoms:  Depression:   Hopelessness  Cognitive Features That Contribute To Risk:  None    Suicide Risk:  Mild:  Suicidal ideation of limited frequency, intensity, duration, and specificity.  There are no identifiable plans, no associated intent, mild dysphoria and related symptoms, good self-control (both objective and subjective assessment), few other risk factors, and identifiable protective factors, including available and accessible social support.    Plan Of Care/Follow-up recommendations:  Other:  Continue following up with PCP. Resources in AVS for acquiring therapist and OP psychiatry for med management.   Medical Decision Making: Patient case reviewed and discussed with Dr. Lucianne Muss.  Patient is able to contract for safety, denies SI/HI/AVH.  Patient does not meet criteria for IVC or inpatient psychiatric admission.  Will psychiatrically clear patient at this time.  EDP, RN, and LCSW notified.  - 14 days of Lexapro 5mg  po daily sent to preferred pharmacy - Resources provided in AVS   Disposition: Psych cleared  , NP 08/10/2022, 10:06 AM

## 2022-08-10 NOTE — ED Notes (Signed)
Review of patient clipboard documents in green zone indicates patient is voluntary at this time.

## 2022-08-20 ENCOUNTER — Ambulatory Visit (HOSPITAL_COMMUNITY): Payer: Medicaid Other

## 2022-08-25 ENCOUNTER — Ambulatory Visit (HOSPITAL_COMMUNITY): Payer: Medicaid Other

## 2022-08-26 ENCOUNTER — Telehealth: Payer: Self-pay | Admitting: *Deleted

## 2022-08-26 NOTE — Telephone Encounter (Signed)
Call from [place] at [facility] - stated in order to do the FNA  biopsy, first pt needs US Thyroid. The [place] was scheduled for yesterday and pt was a No-Show. I talked to Chilon,referral coodinator - stated she will re-schedule [place] and call the pt.

## 2022-08-26 NOTE — Telephone Encounter (Signed)
Pt was prev sch for the following 2 appts :   Name: Charles Callahan, Charles Callahan MRN: [id number]  Date: 08/20/2022 Status: Can  Time: 2:30 PM Length: 60  Visit Type: US THYROID [facility] [[id number]] Copay: $0.00  Provider: [facility] Department: [facility]          Made On: Confirmed: Canceled: 08/13/2022 1:39 PM 08/20/2022 12:38 PM 08/20/2022 1:54 PM By: By: By: Ethelene Browns, Kipp Brood  Cancel Rsn: Patient   Name: Charles Callahan, Charles Callahan MRN: [id number]  Date: 08/25/2022 Status: Can  Time: 12:30 PM Length: 60  Visit Type: US THYROID [facility] [[id number]] Copay: $0.00  Provider: [facility] Department: Tana Felts  Referring Provider: Tyson Alias CSN: [id number]  Notes: arrive @ 12:00 @ [facility] entrance C spoke with pt's niece  Rescheduled: Change Notes: Can. ChkIn: Canceled: 08/20/2022 1:54 PM 08/20/2022 1:56 PM 08/25/2022 2:26 PM 08/25/2022 2:26 PM By: By: By: By: Sherren Mocha, Clide Deutscher I  Cancel Rsn: No Show/Cancel within 24 hours    Called the patient with [facility] on the phone.  The pt is aware of his appt and verified his time and date for the following resch appt   Name: Charles Callahan, Charles Callahan MRN: [id number]  Date: 09/01/2022 Status: Sch  Time: 12:30 PM Length: 60  Visit Type: US THYROID [facility] [[id number]] Copay: $0.00  Provider: [facility] Department: Tana Felts  Referring Provider: Tyson Alias CSN: [id number]  Notes: s/w Chilon pt needs to arrive 12pm @ [facility], pt is aware of appt.  Rescheduled: 08/26/2022 5:20 PM By: Lorenza Cambridge

## 2022-08-31 ENCOUNTER — Other Ambulatory Visit (HOSPITAL_COMMUNITY): Payer: Medicaid Other

## 2022-09-01 ENCOUNTER — Ambulatory Visit (HOSPITAL_COMMUNITY): Payer: Medicaid Other

## 2023-09-22 ENCOUNTER — Other Ambulatory Visit (HOSPITAL_BASED_OUTPATIENT_CLINIC_OR_DEPARTMENT_OTHER): Payer: Self-pay

## 2024-01-14 ENCOUNTER — Encounter (HOSPITAL_COMMUNITY): Payer: Self-pay

## 2024-01-14 ENCOUNTER — Ambulatory Visit (HOSPITAL_COMMUNITY)
Admission: EM | Admit: 2024-01-14 | Discharge: 2024-01-14 | Disposition: A | Discharge status: Home / Self Care | Payer: MEDICAID | Attending: Emergency Medicine | Admitting: Emergency Medicine

## 2024-01-14 DIAGNOSIS — Z760 Encounter for issue of repeat prescription: Secondary | ICD-10-CM | POA: Diagnosis not present

## 2024-01-14 DIAGNOSIS — E785 Hyperlipidemia, unspecified: Secondary | ICD-10-CM

## 2024-01-14 DIAGNOSIS — J069 Acute upper respiratory infection, unspecified: Secondary | ICD-10-CM | POA: Diagnosis not present

## 2024-01-14 DIAGNOSIS — Z8673 Personal history of transient ischemic attack (TIA), and cerebral infarction without residual deficits: Secondary | ICD-10-CM | POA: Diagnosis not present

## 2024-01-14 DIAGNOSIS — K219 Gastro-esophageal reflux disease without esophagitis: Secondary | ICD-10-CM

## 2024-01-14 DIAGNOSIS — I10 Essential (primary) hypertension: Secondary | ICD-10-CM

## 2024-01-14 LAB — POC COVID19/FLU A&B COMBO
Covid Antigen, POC: NEGATIVE
Influenza A Antigen, POC: NEGATIVE
Influenza B Antigen, POC: NEGATIVE

## 2024-01-14 MED ORDER — AMLODIPINE BESYLATE-VALSARTAN 10-160 MG PO TABS: 1.0000 | ORAL_TABLET | Freq: Every day | ORAL | 0 refills | Status: DC

## 2024-01-14 MED ORDER — ASPIRIN 81 MG PO TBEC: 81.0000 mg | DELAYED_RELEASE_TABLET | Freq: Every day | ORAL | 0 refills | Status: DC

## 2024-01-14 MED ORDER — EZETIMIBE 10 MG PO TABS: 10.0000 mg | ORAL_TABLET | Freq: Every day | ORAL | 0 refills | Status: DC

## 2024-01-14 MED ORDER — ATORVASTATIN CALCIUM 80 MG PO TABS: 80.0000 mg | ORAL_TABLET | Freq: Every day | ORAL | 0 refills | Status: DC

## 2024-01-14 MED ORDER — BENZONATATE 100 MG PO CAPS: 100.0000 mg | ORAL_CAPSULE | Freq: Three times a day (TID) | ORAL | 0 refills | Status: DC

## 2024-01-14 MED ORDER — CLOPIDOGREL BISULFATE 75 MG PO TABS: 75.0000 mg | ORAL_TABLET | Freq: Every day | ORAL | 0 refills | Status: DC

## 2024-01-14 MED ORDER — PANTOPRAZOLE SODIUM 40 MG PO TBEC: 40.0000 mg | DELAYED_RELEASE_TABLET | Freq: Every day | ORAL | 0 refills | Status: DC

## 2024-01-14 NOTE — ED Triage Notes (Signed)
 Patient reports that he has had a productive cough with yellow sputum, loss of taste buds, nasal congestion, body aches, and a headache x 3 days.  Patient is also requesting a medication refill for Plavix, Lexapro, Zetia Lipitor, Aspirin, and Amlodipine-valsartan.  Patient states he has been taking Tylenol and Nyquil.

## 2024-01-14 NOTE — ED Provider Notes (Signed)
 [facility]    CSN: [id number] Arrival date & time: 01/14/24  1256      History   Chief Complaint Chief Complaint  Patient presents with   Cough   Headache   Nasal Congestion   Generalized Body Aches   Medication Refill    HPI Charles Callahan is a 53 y.o. male.   Patient presents to clinic over concerns of a productive cough with yellow sputum, loss of taste, nasal congestion, rhinorrhea, body aches, fever and a headache for the past 3 days.  He has been taking cough drops for this.  He would also like refills of his chronic medications.  He was seen and diagnosed with an acute CVA in the fall 2023 and took about a month of the recommended medications and was lost to follow-up.  Does have a history of CHF, hypertension, stroke, dyslipidemia, substance use disorder, anxiety and depression.  Reports he no longer struggles with anxiety or depression.  He has been having some acid reflux recently after eating.  Reports he is no longer using drugs.  Denies chest pain, shortness of breath or wheezing.  Denies vision changes, slurred speech or weakness.  The history is provided by the patient and medical records.  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 Authorizing Provider  benzonatate (TESSALON) 100 MG capsule Take 1 capsule (100 mg total) by mouth every 8 (eight) hours. 01/14/24  Yes Rinaldo Ratel, [place] N, FNP  amLODipine-valsartan (EXFORGE) 10-160 MG tablet Take 1 tablet by mouth daily. 01/14/24 02/13/24  Dario Yono, [place] N, FNP  aspirin EC 81 MG tablet Take 1 tablet (81 mg total) by mouth daily. Swallow whole. 01/14/24   Tennile Styles, [place] N, FNP  atorvastatin (LIPITOR) 80 MG tablet Take 1 tablet (80 mg total) by mouth daily. 01/14/24   Dina Mobley, [place] N, FNP  clopidogrel (PLAVIX) 75 MG tablet Take 1 tablet (75 mg total) by mouth daily. 01/14/24   Delrose Rohwer, [place] N, FNP  escitalopram (LEXAPRO) 5 MG tablet Take 1 tablet (5 mg total) by mouth at bedtime for 14 days. 08/10/22 08/24/22  Eligha Bridegroom, NP  ezetimibe (ZETIA) 10 MG tablet Take 1 tablet (10 mg total) by mouth daily. 01/14/24   Khrista Braun, [place] N, FNP  pantoprazole (PROTONIX) 40 MG tablet Take 1 tablet (  40 mg total) by mouth daily. 01/14/24   Shamiracle Gorden, [place] N, FNP  lisinopril-hydrochlorothiazide (PRINZIDE,ZESTORETIC) 20-25 MG per tablet Take 1 tablet by mouth daily. 06/14/15 02/16/21  Henrietta Hoover, NP    Family History Family History  Problem Relation Age of Onset   Diabetes Mother    Hypertension Mother     Social History Social History   Tobacco Use   Smoking status: Every Day    Current packs/day: 1.00    Types: Cigarettes   Smokeless tobacco: Never  Vaping Use   Vaping status: Former  Substance Use Topics   Alcohol use: Yes    Comment: rare   Drug  use: Not Currently    Types: Cocaine     Allergies   Morphine and codeine   Review of Systems Review of Systems  Per HPI  Physical Exam Triage Vital Signs ED Triage Vitals  Encounter Vitals Group     BP 01/14/24 1316 (!) 207/116     Systolic BP Percentile --      Diastolic BP Percentile --      Pulse Rate 01/14/24 1316 67     Resp 01/14/24 1316 16     Temp 01/14/24 1316 98 F (36.7 C)     Temp Source 01/14/24 1316 Oral     SpO2 01/14/24 1316 96 %     Weight --      Height --      Head Circumference --      Peak Flow --      Pain Score 01/14/24 1315 8     Pain Loc --      Pain Education --      Exclude from Growth Chart --    No data found.  Updated Vital Signs BP (!) 189/107 (BP Location: Right Arm)   Pulse 67   Temp 98 F (36.7 C) (Oral)   Resp 16   SpO2 96%   Visual Acuity Right Eye Distance:   Left Eye Distance:   Bilateral Distance:    Right Eye Near:   Left Eye Near:    Bilateral Near:     Physical Exam Vitals and nursing note reviewed.  Constitutional:      Appearance: Normal appearance.  HENT:     Head: Normocephalic and atraumatic.     Right Ear: External ear normal.     Left Ear: External ear normal.     Nose: Congestion and rhinorrhea present.     Mouth/Throat:     Mouth: Mucous membranes are moist.  Eyes:     Conjunctiva/sclera: Conjunctivae normal.  Cardiovascular:     Rate and Rhythm: Normal rate and regular rhythm.     Heart sounds: Normal heart sounds. No murmur heard. Pulmonary:     Effort: Pulmonary effort is normal. No respiratory distress.     Breath sounds: Normal breath sounds.  Musculoskeletal:        General: Normal range of motion.  Skin:    General: Skin is warm and dry.  Neurological:     General: No focal deficit present.     Mental Status: He is alert.  Psychiatric:        Mood and Affect: Mood normal.      UC Treatments / Results  Labs (all labs ordered are listed, but only abnormal results are  displayed) Labs Reviewed  POC COVID19/FLU A&B COMBO - Normal    EKG   Radiology No results found.  Procedures Procedures (including critical care time)  Medications Ordered in UC Medications - No data to display  Initial Impression / Assessment and Plan / UC Course  I have reviewed the triage vital signs and the nursing notes.  Pertinent labs & imaging results that were available during my care of the patient were reviewed by me and considered in my medical decision making (see chart for details).  Vitals and triage reviewed, patient is hemodynamically stable.  Lungs are vesicular, or with regular rate and rhythm.  Congestion and rhinorrhea present on physical exam.  POC testing negative for influenza and COVID, suspect other viral URI.  Symptomatic management discussed.  Patient is hypertensive in clinic, minimally improved on recheck.  Without signs or symptoms of hypertensive urgency or emergency.  History of CVA.  Will refill medications for 1 month and made an appointment with a primary care provider for further refills.  Bleeding precautions discussed on Plavix.  Strict emergency precautions given.  Plan of care, follow-up care return precautions discussed, no questions at this time.  Work note provided.     Final Clinical Impressions(s) / UC Diagnoses   Final diagnoses:  Essential hypertension  History of CVA (cerebrovascular accident)  Medication refill  Viral URI with cough  Gastroesophageal reflux disease, unspecified whether esophagitis present     Discharge Instructions      I have refilled your chronic medications.  It is essential that you take these as prescribed and follow-up as scheduled with a primary care provider.  You have an appointment on April 16 at 2:10 in the afternoon.  Please monitor your salt intake and measure your blood pressure in the morning and then in the evening, keep a record of this and bring your log to the visit with your primary care  provider.  COVID and flu testing were negative, you most likely have a different viral illness.  You can alternate between Tylenol and ibuprofen every 4-6 hours.  Use the Tessalon as needed for cough.  Symptoms should improve over the next 5 days or so.  As Urgent Care providers, we only only can evaluate you for an episodic event; and this cannot be substituted for the continued care and monitoring by your primary care provider and/or specialist. It is not unusual that a medical condition can present itself in one way, then progress or change and lead to another impression of your medical condition. If you should have any new or worsening symptoms, please go to the closest emergency department and contact your primary physician as soon as possible for further evaluation and testing.      ED Prescriptions     Medication Sig Dispense Auth. Provider   benzonatate (TESSALON) 100 MG capsule Take 1 capsule (100 mg total) by mouth every 8 (eight) hours. 21 capsule Rinaldo Ratel, [place] N, [place]   amLODipine-valsartan (EXFORGE) 10-160 MG tablet Take 1 tablet by mouth daily. 30 tablet Rinaldo Ratel, [place] N, [place]   aspirin EC 81 MG tablet Take 1 tablet (81 mg total) by mouth daily. Swallow whole. 30 tablet Rinaldo Ratel, [place] N, [place]   atorvastatin (LIPITOR) 80 MG tablet Take 1 tablet (80 mg total) by mouth daily. 30 tablet Rinaldo Ratel, [place] N, [place]   clopidogrel (PLAVIX) 75 MG tablet Take 1 tablet (75 mg total) by mouth daily. 30 tablet Rinaldo Ratel, [place] N, [place]   ezetimibe (ZETIA) 10 MG tablet Take 1 tablet (10 mg total) by mouth daily. 30 tablet Rinaldo Ratel, [place] N, [place]   pantoprazole (PROTONIX) 40 MG tablet Take 1 tablet (40 mg total)  by mouth daily. 30 tablet Jawana Reagor, [place] N, [place]      PDMP not reviewed this encounter.   Janiah Devinney, [place] N, [place] 01/14/24 [id number]

## 2024-01-14 NOTE — Discharge Instructions (Addendum)
 I have refilled your chronic medications.  It is essential that you take these as prescribed and follow-up as scheduled with a primary care provider.  You have an appointment on April 16 at 2:10 in the afternoon.  Please monitor your salt intake and measure your blood pressure in the morning and then in the evening, keep a record of this and bring your log to the visit with your primary care provider.  COVID and flu testing were negative, you most likely have a different viral illness.  You can alternate between Tylenol and ibuprofen every 4-6 hours.  Use the Tessalon as needed for cough.  Symptoms should improve over the next 5 days or so.  As Urgent Care providers, we only only can evaluate you for an episodic event; and this cannot be substituted for the continued care and monitoring by your primary care provider and/or specialist. It is not unusual that a medical condition can present itself in one way, then progress or change and lead to another impression of your medical condition. If you should have any new or worsening symptoms, please go to the closest emergency department and contact your primary physician as soon as possible for further evaluation and testing.

## 2024-02-02 ENCOUNTER — Encounter: Payer: Self-pay | Admitting: Family Medicine

## 2024-02-02 ENCOUNTER — Ambulatory Visit (INDEPENDENT_AMBULATORY_CARE_PROVIDER_SITE_OTHER): Payer: MEDICAID | Admitting: Family Medicine

## 2024-02-02 VITALS — BP 189/109 | HR 61 | Ht 66.0 in | Wt 214.4 lb

## 2024-02-02 DIAGNOSIS — H532 Diplopia: Secondary | ICD-10-CM

## 2024-02-02 DIAGNOSIS — F323 Major depressive disorder, single episode, severe with psychotic features: Secondary | ICD-10-CM

## 2024-02-02 DIAGNOSIS — E785 Hyperlipidemia, unspecified: Secondary | ICD-10-CM | POA: Diagnosis not present

## 2024-02-02 DIAGNOSIS — Z720 Tobacco use: Secondary | ICD-10-CM

## 2024-02-02 DIAGNOSIS — Z23 Encounter for immunization: Secondary | ICD-10-CM

## 2024-02-02 DIAGNOSIS — Z6834 Body mass index (BMI) 34.0-34.9, adult: Secondary | ICD-10-CM

## 2024-02-02 DIAGNOSIS — I639 Cerebral infarction, unspecified: Secondary | ICD-10-CM

## 2024-02-02 DIAGNOSIS — Z1211 Encounter for screening for malignant neoplasm of colon: Secondary | ICD-10-CM

## 2024-02-02 DIAGNOSIS — E041 Nontoxic single thyroid nodule: Secondary | ICD-10-CM

## 2024-02-02 DIAGNOSIS — Z125 Encounter for screening for malignant neoplasm of prostate: Secondary | ICD-10-CM

## 2024-02-02 DIAGNOSIS — I10 Essential (primary) hypertension: Secondary | ICD-10-CM

## 2024-02-02 DIAGNOSIS — Z7689 Persons encountering health services in other specified circumstances: Secondary | ICD-10-CM

## 2024-02-02 MED ORDER — ESCITALOPRAM OXALATE 5 MG PO TABS: 5.0000 mg | ORAL_TABLET | Freq: Every day | ORAL | 2 refills | Status: DC

## 2024-02-02 MED ORDER — ATORVASTATIN CALCIUM 80 MG PO TABS: 80.0000 mg | ORAL_TABLET | Freq: Every day | ORAL | 2 refills | Status: DC

## 2024-02-02 MED ORDER — ASPIRIN 81 MG PO TBEC: 81.0000 mg | DELAYED_RELEASE_TABLET | Freq: Every day | ORAL | 2 refills | Status: DC

## 2024-02-02 MED ORDER — AMLODIPINE BESYLATE-VALSARTAN 10-160 MG PO TABS: 1.0000 | ORAL_TABLET | Freq: Every day | ORAL | 2 refills | Status: DC

## 2024-02-02 MED ORDER — EZETIMIBE 10 MG PO TABS: 10.0000 mg | ORAL_TABLET | Freq: Every day | ORAL | 2 refills | Status: DC

## 2024-02-02 MED ORDER — CLOPIDOGREL BISULFATE 75 MG PO TABS: 75.0000 mg | ORAL_TABLET | Freq: Every day | ORAL | 2 refills | Status: DC

## 2024-02-02 MED ORDER — PANTOPRAZOLE SODIUM 40 MG PO TBEC: 40.0000 mg | DELAYED_RELEASE_TABLET | Freq: Every day | ORAL | 2 refills | Status: DC

## 2024-02-02 NOTE — Patient Instructions (Signed)
 It was nice to see you today,  We addressed the following topics today: -I am sending in a referral for colonoscopy. - I am sending in an order for a CT scan of your lungs.  This is to check for lung cancer due to your smoking history - I am going to order some blood test and you will need to have these done in our office sometime between now and the next month when I see you again - I am sending in all your medications to the pharmacy.  Have a great day,  Etha Henle, MD

## 2024-02-02 NOTE — Progress Notes (Signed)
 New Patient Office Visit  Subjective   Patient ID: Charles Callahan, male    DOB: May 26, 1971  Age: 53 y.o. MRN: [id number]  CC:  Chief Complaint  Patient presents with   New Patient (Initial Visit)    HPI AVEDIS BEVIS presents to establish care Patient was previously going to the internal medicine residency clinic.  He has history of CVA, CHF, hypertension stroke hyperlipidemia substance use, depression.  Patient still has some mild residual right arm weakness from his CVA.  We went over the patient's medications and every medication listed he states he is taking currently.  He endorses taking his blood pressure medication this morning.  His last refills were from the urgent care and he only has 3-day supplies of these.  Patient has diplopia.  He had esotropia as a child in subsequently had surgery to correct this but still has double vision.    PMH: CVA, CHF, hypertension, stroke, HLD, anxiety depression, substance use disorder.    PSH: no  FH: mother - diabetes, breast cancer.    Tobacco use: cigatettes - 2-3 cigarettes a day.started at 54 yo.  1-2 ppd.  Up to this year.     Alcohol use: beer every once in a while.   Drug use: no.  Former.   Marital status: married.  1 daughter, 1 grandchild Employment: working - [employer].    Screenings:  Colon Cancer: needs.  Mgf - had colon cancer.   Lung Cancer: needs ct   Outpatient Encounter Medications as of 02/02/2024  Medication Sig   benzonatate  (TESSALON ) 100 MG capsule Take 1 capsule (100 mg total) by mouth every 8 (eight) hours.   [DISCONTINUED] amLODipine -valsartan  (EXFORGE ) 10-160 MG tablet Take 1 tablet by mouth daily.   [DISCONTINUED] aspirin  EC 81 MG tablet Take 1 tablet (81 mg total) by mouth daily. Swallow whole.   [DISCONTINUED] atorvastatin  (LIPITOR ) 80 MG tablet Take 1 tablet (80 mg total) by mouth daily.   [DISCONTINUED] clopidogrel  (PLAVIX ) 75 MG tablet Take 1 tablet (75 mg total) by mouth  daily.   [DISCONTINUED] ezetimibe  (ZETIA ) 10 MG tablet Take 1 tablet (10 mg total) by mouth daily.   [DISCONTINUED] pantoprazole  (PROTONIX ) 40 MG tablet Take 1 tablet (40 mg total) by mouth daily.   amLODipine -valsartan  (EXFORGE ) 10-160 MG tablet Take 1 tablet by mouth daily.   aspirin  EC 81 MG tablet Take 1 tablet (81 mg total) by mouth daily. Swallow whole.   atorvastatin  (LIPITOR ) 80 MG tablet Take 1 tablet (80 mg total) by mouth daily.   clopidogrel  (PLAVIX ) 75 MG tablet Take 1 tablet (75 mg total) by mouth daily.   escitalopram  (LEXAPRO ) 5 MG tablet Take 1 tablet (5 mg total) by mouth at bedtime.   ezetimibe  (ZETIA ) 10 MG tablet Take 1 tablet (10 mg total) by mouth daily.   pantoprazole  (PROTONIX ) 40 MG tablet Take 1 tablet (40 mg total) by mouth daily.   [DISCONTINUED] escitalopram  (LEXAPRO ) 5 MG tablet Take 1 tablet (5 mg total) by mouth at bedtime for 14 days.   [DISCONTINUED] lisinopril -hydrochlorothiazide  (PRINZIDE ,ZESTORETIC ) 20-25 MG per tablet Take 1 tablet by mouth daily.   No facility-administered encounter medications on file as of 02/02/2024.    Past Medical History:  Diagnosis Date   CHF (congestive heart failure) (HCC)    Hypertension    Stroke [facility])     Past Surgical History:  Procedure Laterality Date   EYE SURGERY      Family History  Problem Relation  Age of Onset   Diabetes Mother    Hypertension Mother     Social History   Socioeconomic History   Marital status: Married    Spouse name: Not on file   Number of children: Not on file   Years of education: Not on file   Highest education level: Not on file  Occupational History   Not on file  Tobacco Use   Smoking status: Every Day    Current packs/day: 1.00    Types: Cigarettes   Smokeless tobacco: Never  Vaping Use   Vaping status: Former  Substance and Sexual Activity   Alcohol use: Yes    Comment: rare   Drug use: Not Currently    Types: Cocaine   Sexual activity: Yes  Other Topics  Concern   Not on file  Social History Narrative   ** Merged History Encounter **       Social Drivers of Health   Financial Resource Strain: Not on file  Food Insecurity: Food Insecurity Present (03/01/2021)   Hunger Vital Sign    Worried About Running Out of Food in the Last Year: Sometimes true    Ran Out of Food in the Last Year: Sometimes true  Transportation Needs: Unmet Transportation Needs (03/01/2021)   PRAPARE - Administrator, Civil Service (Medical): Yes    Lack of Transportation (Non-Medical): Yes  Physical Activity: Not on file  Stress: Not on file  Social Connections: Not on file  Intimate Partner Violence: Not on file    ROS     Objective   BP (!) 189/109   Pulse 61   Ht 5\' 6"  (1.676 m)   Wt 214 lb 6.4 oz (97.3 kg)   SpO2 97%   BMI 34.61 kg/m   Physical Exam     Assessment & Plan:   Encounter to establish care  Primary hypertension -     CBC with Differential/Platelet; Future -     Comprehensive metabolic panel with GFR; Future -     Ceruloplasmin; Future  Thyroid nodule -     TSH; Future  Dyslipidemia -     Atorvastatin  Calcium ; Take 1 tablet (80 mg total) by mouth daily.  Dispense: 30 tablet; Refill: 2  Encounter for colorectal cancer screening -     Ambulatory referral to Gastroenterology  Screening for malignant neoplasm of prostate -     PSA; Future  Tobacco use -     CT CHEST LUNG CANCER SCREENING LOW DOSE WO CONTRAST; Future  BMI 34.0-34.9,adult -     Hemoglobin A1c; Future -     Lipid panel; Future  Immunization due -     Tdap vaccine greater than or equal to 7yo IM  Essential hypertension Assessment & Plan: Sending in refill of Exforge .   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  Dispense: 30 tablet; Refill: 2 -     Aspirin ; Take 1 tablet (81 mg total) by mouth daily. Swallow whole.  Dispense: 30 tablet; Refill: 2 -     Clopidogrel  Bisulfate; Take 1 tablet (75 mg total) by mouth daily.  Dispense: 30 tablet; Refill: 2 -  Escitalopram  Oxalate; Take 1 tablet (5 mg total) by mouth at bedtime.  Dispense: 30 tablet; Refill: 2 -     Ezetimibe ; Take 1 tablet (10 mg total) by mouth daily.  Dispense: 30 tablet; Refill: 2 -     Pantoprazole  Sodium; Take 1 tablet (40 mg total) by mouth daily.  Dispense: 30 tablet; Refill: 2    Return in about 4 weeks (around 03/01/2024) for HTN.   Laneta Pintos, MD

## 2024-02-08 DIAGNOSIS — H532 Diplopia: Secondary | ICD-10-CM | POA: Insufficient documentation

## 2024-02-08 NOTE — Assessment & Plan Note (Signed)
 Continue Zetia , aspirin , clopidogrel 

## 2024-02-08 NOTE — Assessment & Plan Note (Signed)
 Sending in refill of Exforge .

## 2024-02-08 NOTE — Assessment & Plan Note (Signed)
 Continue escitalopram

## 2024-02-08 NOTE — Assessment & Plan Note (Signed)
 Esotropia as a child.  Had surgical correction but still suffers from binocular diplopia

## 2024-02-10 ENCOUNTER — Ambulatory Visit
Admission: RE | Admit: 2024-02-10 | Discharge: 2024-02-10 | Disposition: A | Discharge status: Home / Self Care | Payer: MEDICAID | Source: Ambulatory Visit | Attending: Family Medicine | Admitting: Family Medicine

## 2024-02-10 DIAGNOSIS — Z720 Tobacco use: Secondary | ICD-10-CM

## 2024-02-18 ENCOUNTER — Encounter: Payer: Self-pay | Admitting: Physician Assistant

## 2024-02-24 ENCOUNTER — Encounter (HOSPITAL_COMMUNITY): Payer: Self-pay

## 2024-02-24 ENCOUNTER — Other Ambulatory Visit: Payer: MEDICAID

## 2024-02-24 DIAGNOSIS — I10 Essential (primary) hypertension: Secondary | ICD-10-CM

## 2024-02-24 DIAGNOSIS — E041 Nontoxic single thyroid nodule: Secondary | ICD-10-CM

## 2024-02-24 DIAGNOSIS — Z6834 Body mass index (BMI) 34.0-34.9, adult: Secondary | ICD-10-CM

## 2024-02-24 DIAGNOSIS — Z125 Encounter for screening for malignant neoplasm of prostate: Secondary | ICD-10-CM

## 2024-03-02 ENCOUNTER — Ambulatory Visit: Payer: MEDICAID | Admitting: Family Medicine

## 2024-03-02 ENCOUNTER — Encounter: Payer: Self-pay | Admitting: Family Medicine

## 2024-03-02 VITALS — BP 175/97 | HR 65 | Ht 66.0 in | Wt 215.4 lb

## 2024-03-02 DIAGNOSIS — I10 Essential (primary) hypertension: Secondary | ICD-10-CM

## 2024-03-02 NOTE — Assessment & Plan Note (Signed)
-    BP remains elevated, will have patient check BP at work daily and report readings -  Ordered labs to check potassium and creatinine levels before medication adjustment -  Current medication (Xforge) not providing adequate control -  Plan to possibly add hydrochlorothiazide  once lab results reviewed, potentially switching to triple combination pill

## 2024-03-02 NOTE — Patient Instructions (Signed)
 It was nice to see you today,  We addressed the following topics today: -I am going to get your labs and check your kidney function and potassium level before I change your medication.  Once I find out those results, likely tomorrow, I will send in a new prescription for blood pressure medication. - No changes to your other medications at this time. - I have provided you with a sheet of paper to check your blood pressure.  Check it twice a day for the next 2 weeks after you start the new medication and then bring the results back to us .  You can send a message through MyChart or in the results to the front desk and they will give it to me.  Have a great day,  Etha Henle, MD

## 2024-03-02 NOTE — Progress Notes (Signed)
   Established Patient Office Visit  Subjective   Patient ID: Charles Callahan, male    DOB: [birth year]  Age: 53 y.o. MRN: [id number]  Chief Complaint  Patient presents with   Medical Management of Chronic Issues    HPI  Subjective - Hypertension: BP elevated at today's visit - Reports no changes since last visit - Taking all current medications as prescribed - Does not check BP at home, but can check at work daily  Medications: Xforge (amlodipine /valsartan ), previously on lisinopril /hydrochlorothiazide  (Prinzide  or Zestoretic ), no reported issues with hydrochlorothiazide  component, switched possibly due to inadequate BP control.  PMH, PSH, FH, Social Hx: Hypertension, hypercholesterolemia.  ROS: Denies increased urination with previous hydrochlorothiazide .   The ASCVD Risk score (Arnett DK, et al., 2019) failed to calculate for the following reasons:   Risk score cannot be calculated because patient has a medical history suggesting prior/existing ASCVD  Health Maintenance Due  Topic Date Due   Hepatitis C Screening  Never done   Colonoscopy  Never done   COVID-19 Vaccine (1 - 2024-25 season) Never done      Objective:     BP (!) 178/94   Pulse 65   Ht 5\' 6"  (1.676 m)   Wt 215 lb 6.4 oz (97.7 kg)   SpO2 97%   BMI 34.77 kg/m    Physical Exam: General: Alert, oriented Pulmonary: No respiratory distress Psych: Pleasant affect   No results found for any visits on 03/02/24.      Assessment & Plan:   Essential hypertension Assessment & Plan: -  BP remains elevated, will have patient check BP at work daily and report readings -  Ordered labs to check potassium and creatinine levels before medication adjustment -  Current medication (Xforge) not providing adequate control -  Plan to possibly add hydrochlorothiazide  once lab results reviewed, potentially switching to triple combination pill      Return in about 3 months (around 06/02/2024) for HTN.     Laneta Pintos, MD

## 2024-03-03 LAB — CBC WITH DIFFERENTIAL/PLATELET
Basophils Absolute: 0 10*3/uL (ref 0.0–0.2)
Basos: 1 %
EOS (ABSOLUTE): 0.2 10*3/uL (ref 0.0–0.4)
Eos: 3 %
Hematocrit: 49.5 % (ref 37.5–51.0)
Hemoglobin: 16.4 g/dL (ref 13.0–17.7)
Immature Grans (Abs): 0 10*3/uL (ref 0.0–0.1)
Immature Granulocytes: 0 %
Lymphocytes Absolute: 1.9 10*3/uL (ref 0.7–3.1)
Lymphs: 38 %
MCH: 31.9 pg (ref 26.6–33.0)
MCHC: 33.1 g/dL (ref 31.5–35.7)
MCV: 96 fL (ref 79–97)
Monocytes Absolute: 0.8 10*3/uL (ref 0.1–0.9)
Monocytes: 15 %
Neutrophils Absolute: 2.2 10*3/uL (ref 1.4–7.0)
Neutrophils: 43 %
Platelets: 287 10*3/uL (ref 150–450)
RBC: 5.14 x10E6/uL (ref 4.14–5.80)
RDW: 12.4 % (ref 11.6–15.4)
WBC: 5.1 10*3/uL (ref 3.4–10.8)

## 2024-03-03 LAB — COMPREHENSIVE METABOLIC PANEL WITH GFR
ALT: 25 IU/L (ref 0–44)
AST: 25 IU/L (ref 0–40)
Albumin: 4.2 g/dL (ref 3.8–4.9)
Alkaline Phosphatase: 114 IU/L (ref 44–121)
BUN/Creatinine Ratio: 12 (ref 9–20)
BUN: 11 mg/dL (ref 6–24)
Bilirubin Total: 0.3 mg/dL (ref 0.0–1.2)
CO2: 21 mmol/L (ref 20–29)
Calcium: 11.5 mg/dL — ABNORMAL HIGH (ref 8.7–10.2)
Chloride: 101 mmol/L (ref 96–106)
Creatinine, Ser: 0.89 mg/dL (ref 0.76–1.27)
Globulin, Total: 2.9 g/dL (ref 1.5–4.5)
Glucose: 90 mg/dL (ref 70–99)
Potassium: 4.6 mmol/L (ref 3.5–5.2)
Sodium: 136 mmol/L (ref 134–144)
Total Protein: 7.1 g/dL (ref 6.0–8.5)
eGFR: 102 mL/min/{1.73_m2} (ref >59)

## 2024-03-03 LAB — HEMOGLOBIN A1C
Est. average glucose Bld gHb Est-mCnc: 111 mg/dL
Hgb A1c MFr Bld: 5.5 % (ref 4.8–5.6)

## 2024-03-03 LAB — LIPID PANEL
Chol/HDL Ratio: 4.4 ratio (ref 0.0–5.0)
Cholesterol, Total: 170 mg/dL (ref 100–199)
HDL: 39 mg/dL — ABNORMAL LOW (ref >39)
LDL Chol Calc (NIH): 111 mg/dL — ABNORMAL HIGH (ref 0–99)
Triglycerides: 110 mg/dL (ref 0–149)
VLDL Cholesterol Cal: 20 mg/dL (ref 5–40)

## 2024-03-03 LAB — TSH: TSH: 0.765 u[IU]/mL (ref 0.450–4.500)

## 2024-03-03 LAB — PSA: Prostate Specific Ag, Serum: 0.7 ng/mL (ref 0.0–4.0)

## 2024-03-06 ENCOUNTER — Ambulatory Visit: Payer: Self-pay | Admitting: Family Medicine

## 2024-04-09 NOTE — Progress Notes (Deleted)
 Ellouise Console, PA-C [address] [place], [place]  [address] Phone: [phone]   Gastroenterology Consultation  Referring Provider:     Chandra Toribio POUR, MD Primary Care Physician:  Chandra Toribio POUR, MD Primary Gastroenterologist:  Ellouise Console, PA-C / *** Reason for Consultation:     Discuss colonoscopy        HPI:   Charles Callahan is a 53 y.o. y/o male referred for consultation & management  by Chandra Toribio POUR, MD. here to discuss scheduling a colonoscopy.  He has never had a colonoscopy.  Family history of colon cancer?  GI symptoms?  PMH: History of CVA (06/2022), CHF, HTN, thyroid nodule, right hemiparesis, substance use disorder.  Currently on aspirin  and Plavix .  Takes pantoprazole  40 Mg daily for GERD.  Echo 06/2022 showed LVEF 60 to 65%.  Past Medical History:  Diagnosis Date   CHF (congestive heart failure) (HCC)    Hypertension    Stroke [facility])     Past Surgical History:  Procedure Laterality Date   EYE SURGERY      Prior to Admission medications   Medication Sig Start Date End Date Taking? Authorizing Provider  amLODipine -valsartan  (EXFORGE ) 10-160 MG tablet Take 1 tablet by mouth daily. 02/02/24   Chandra Toribio POUR, MD  aspirin  EC 81 MG tablet Take 1 tablet (81 mg total) by mouth daily. Swallow whole. 02/02/24   Chandra Toribio POUR, MD  atorvastatin  (LIPITOR ) 80 MG tablet Take 1 tablet (80 mg total) by mouth daily. 02/02/24   Chandra Toribio POUR, MD  benzonatate  (TESSALON ) 100 MG capsule Take 1 capsule (100 mg total) by mouth every 8 (eight) hours. 01/14/24   Dream[place], [place]  N, FNP  clopidogrel  (PLAVIX ) 75 MG tablet Take 1 tablet (75 mg total) by mouth daily. 02/02/24   Chandra Toribio POUR, MD  escitalopram  (LEXAPRO ) 5 MG tablet Take 1 tablet (5 mg total) by mouth at bedtime. 02/02/24   Chandra Toribio POUR, MD  ezetimibe  (ZETIA ) 10 MG tablet Take 1 tablet (10 mg total) by mouth daily. 02/02/24   Chandra Toribio POUR, MD  pantoprazole  (PROTONIX ) 40 MG tablet Take 1 tablet (40 mg total)  by mouth daily. 02/02/24   Chandra Toribio POUR, MD  lisinopril -hydrochlorothiazide  (PRINZIDE ,ZESTORETIC ) 20-25 MG per tablet Take 1 tablet by mouth daily. 06/14/15 02/16/21  [place] BROCKS, NP    Family History  Problem Relation Age of Onset   Diabetes Mother    Hypertension Mother      Social History   Tobacco Use   Smoking status: Every Day    Current packs/day: 1.00    Types: Cigarettes   Smokeless tobacco: Never  Vaping Use   Vaping status: Former  Substance Use Topics   Alcohol use: Yes    Comment: rare   Drug use: Not Currently    Types: Cocaine    Allergies as of 04/10/2024 - Review Complete 03/02/2024  Allergen Reaction Noted   Morphine  and codeine Nausea And Vomiting 04/26/2012    Review of Systems:    All systems reviewed and negative except where noted in HPI.   Physical Exam:  There were no vitals taken for this visit. No LMP for male patient.  General:   Alert,  Well-developed, well-nourished, pleasant and cooperative in NAD Lungs:  Respirations even and unlabored.  Clear throughout to auscultation.   No wheezes, crackles, or rhonchi. No acute distress. Heart:  Regular rate and rhythm; no murmurs, clicks, rubs, or gallops. Abdomen:  Normal bowel sounds.  No bruits.  Soft, and non-distended without masses, hepatosplenomegaly or hernias noted.  No Tenderness.  No guarding or rebound tenderness.    Neurologic:  Alert and oriented x3;  grossly normal neurologically. Psych:  Alert and cooperative. Normal mood and affect.  Imaging Studies: No results found.  Labs: CBC    Component Value Date/Time   WBC 5.1 03/02/2024 1057   WBC 4.2 08/09/2022 0119   RBC 5.14 03/02/2024 1057   RBC 4.70 08/09/2022 0119   HGB 16.4 03/02/2024 1057   HCT 49.5 03/02/2024 1057   PLT 287 03/02/2024 1057   MCV 96 03/02/2024 1057   MCH 31.9 03/02/2024 1057   MCH 31.5 08/09/2022 0119   MCHC 33.1 03/02/2024 1057   MCHC 33.9 08/09/2022 0119   RDW 12.4 03/02/2024 1057   LYMPHSABS  1.9 03/02/2024 1057   MONOABS 0.9 08/09/2022 0119   EOSABS 0.2 03/02/2024 1057   BASOSABS 0.0 03/02/2024 1057    CMP     Component Value Date/Time   NA 136 03/02/2024 1057   K 4.6 03/02/2024 1057   CL 101 03/02/2024 1057   CO2 21 03/02/2024 1057   GLUCOSE 90 03/02/2024 1057   GLUCOSE 109 (H) 08/09/2022 0119   BUN 11 03/02/2024 1057   CREATININE 0.89 03/02/2024 1057   CREATININE 0.98 06/14/2015 0938   CALCIUM  11.5 (H) 03/02/2024 1057   PROT 7.1 03/02/2024 1057   ALBUMIN 4.2 03/02/2024 1057   AST 25 03/02/2024 1057   ALT 25 03/02/2024 1057   ALKPHOS 114 03/02/2024 1057   BILITOT 0.3 03/02/2024 1057   GFRNONAA >60 08/09/2022 0119   GFRNONAA >89 06/14/2015 0938   GFRAA >60 09/20/2017 0821   GFRAA >89 06/14/2015 0938    Assessment and Plan:   Charles Callahan is a 53 y.o. y/o male has been referred for   1.  Colon cancer screening; No previous colonoscopy - Scheduling Colonoscopy I discussed risks of colonoscopy with patient to include risk of bleeding, colon perforation, and risk of sedation.  Patient expressed understanding and agrees to proceed with colonoscopy.   2.  Comorbidities: History of CVA (06/2022), CHF, HTN, thyroid nodule, right hemiparesis, substance use disorder.  Currently on aspirin  and Plavix .  Takes pantoprazole  40 Mg daily for GERD.  Echo 06/2022 showed LVEF 60 to 65%. - Request permission to hold Plavix  5 days prior to colonoscopy procedure.  Follow up ***  Ellouise Console, PA-C

## 2024-04-10 ENCOUNTER — Ambulatory Visit: Payer: MEDICAID | Admitting: Physician Assistant

## 2024-05-12 ENCOUNTER — Ambulatory Visit: Payer: MEDICAID | Admitting: Gastroenterology

## 2024-06-02 ENCOUNTER — Ambulatory Visit: Payer: MEDICAID | Admitting: Family Medicine

## 2024-08-06 ENCOUNTER — Encounter (HOSPITAL_COMMUNITY): Payer: Self-pay

## 2024-08-06 ENCOUNTER — Ambulatory Visit (HOSPITAL_COMMUNITY)
Admission: EM | Admit: 2024-08-06 | Discharge: 2024-08-06 | Disposition: A | Discharge status: Home / Self Care | Payer: MEDICAID | Attending: Internal Medicine | Admitting: Internal Medicine

## 2024-08-06 DIAGNOSIS — R509 Fever, unspecified: Secondary | ICD-10-CM | POA: Diagnosis not present

## 2024-08-06 DIAGNOSIS — R52 Pain, unspecified: Secondary | ICD-10-CM

## 2024-08-06 DIAGNOSIS — J029 Acute pharyngitis, unspecified: Secondary | ICD-10-CM | POA: Diagnosis not present

## 2024-08-06 DIAGNOSIS — J069 Acute upper respiratory infection, unspecified: Secondary | ICD-10-CM | POA: Diagnosis not present

## 2024-08-06 DIAGNOSIS — R110 Nausea: Secondary | ICD-10-CM

## 2024-08-06 LAB — POCT RAPID STREP A (OFFICE): Rapid Strep A Screen: NEGATIVE

## 2024-08-06 LAB — POC COVID19/FLU A&B COMBO
Covid Antigen, POC: NEGATIVE
Influenza A Antigen, POC: NEGATIVE
Influenza B Antigen, POC: NEGATIVE

## 2024-08-06 MED ORDER — PREDNISONE 20 MG PO TABS: 20.0000 mg | ORAL_TABLET | Freq: Every day | ORAL | 0 refills | Status: AC

## 2024-08-06 MED ORDER — ONDANSETRON 4 MG PO TBDP
ORAL_TABLET | ORAL | Status: AC
  Filled 2024-08-06: qty 1

## 2024-08-06 MED ORDER — ACETAMINOPHEN 325 MG PO TABS
ORAL_TABLET | ORAL | Status: AC
  Filled 2024-08-06: qty 2

## 2024-08-06 MED ORDER — ONDANSETRON 4 MG PO TBDP: 4.0000 mg | ORAL_TABLET | Freq: Three times a day (TID) | ORAL | 0 refills | Status: DC | PRN

## 2024-08-06 MED ORDER — ACETAMINOPHEN 325 MG PO TABS
650.0000 mg | ORAL_TABLET | Freq: Once | ORAL | Status: AC
  Administered 2024-08-06: 650 mg via ORAL

## 2024-08-06 MED ORDER — ONDANSETRON 4 MG PO TBDP
4.0000 mg | ORAL_TABLET | Freq: Once | ORAL | Status: AC
  Administered 2024-08-06: 4 mg via ORAL

## 2024-08-06 MED ORDER — BENZONATATE 100 MG PO CAPS: 100.0000 mg | ORAL_CAPSULE | Freq: Three times a day (TID) | ORAL | 0 refills | Status: DC

## 2024-08-06 NOTE — ED Provider Notes (Addendum)
 [facility]    CSN: [id number] Arrival date & time: 08/06/24  1510      History   Chief Complaint Chief Complaint  Patient presents with   Sore Throat    HPI Charles Callahan is a 53 y.o. male.   53 year old male presents urgent care with complaints of productive cough, congestion, sore throat, body aches, fevers, chills and ear pain.  His symptoms started early this morning.  He has taken some throat lozenges but otherwise has not taken any medication.  He reports that other people at work have been sick but he does not recall if they have been sick with anything particular.  He is also feeling very nauseated due to coughing as well as the mucus production.  He denies shortness of breath or chest pain.   Sore Throat Pertinent negatives include no chest pain, no abdominal pain and no shortness of breath.    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 Authorizing Provider  benzonatate  (TESSALON ) 100 MG capsule Take 1 capsule (100 mg total) by mouth every 8 (eight) hours. 08/06/24  Yes Lizzet Hendley A, PA-C  ondansetron  (ZOFRAN -ODT) 4 MG disintegrating tablet Take 1 tablet (4 mg total) by mouth every 8 (eight) hours as needed for nausea or vomiting. 08/06/24  Yes Bronsyn Shappell A, PA-C  predniSONE  (DELTASONE ) 20 MG tablet Take 1 tablet (20 mg total) by mouth daily with breakfast for 3 days. 08/06/24 08/09/24 Yes Hiroyuki Ozanich A, PA-C  amLODipine -valsartan  (EXFORGE ) 10-160 MG tablet Take 1 tablet by mouth daily. 02/02/24   Chandra Toribio POUR, MD  aspirin  EC 81 MG tablet Take 1 tablet (81 mg total) by mouth daily. Swallow whole. 02/02/24   Chandra Toribio POUR, MD  atorvastatin  (LIPITOR ) 80 MG tablet Take 1 tablet (80 mg total) by mouth daily. 02/02/24   Chandra Toribio POUR, MD  clopidogrel  (PLAVIX ) 75 MG tablet Take 1 tablet (75 mg total) by mouth daily. 02/02/24   Chandra Toribio POUR, MD  escitalopram  (LEXAPRO ) 5 MG tablet Take 1 tablet (5 mg total) by mouth at bedtime. 02/02/24   Chandra Toribio POUR, MD  ezetimibe  (ZETIA ) 10 MG tablet Take 1 tablet (10 mg total) by mouth daily. 02/02/24   Chandra Toribio POUR, MD  pantoprazole  (PROTONIX ) 40 MG tablet Take 1 tablet (40  mg total) by mouth daily. 02/02/24   Chandra Toribio POUR, MD  lisinopril -hydrochlorothiazide  (PRINZIDE ,ZESTORETIC ) 20-25 MG per tablet Take 1 tablet by mouth daily. 06/14/15 02/16/21  Torrance Rock BROCKS, NP    Family History Family History  Problem Relation Age of Onset   Diabetes Mother    Hypertension Mother     Social History Social History   Tobacco Use   Smoking status: Some Days    Current packs/day: 1.00    Types: Cigarettes   Smokeless tobacco: Never  Vaping Use   Vaping status: Former  Substance Use Topics   Alcohol use: Yes    Comment: rare   Drug use: Not Currently    Types: Cocaine      Allergies   Morphine  and codeine   Review of Systems Review of Systems  Constitutional:  Positive for chills, fatigue and fever.  HENT:  Positive for congestion, ear pain and sore throat.   Eyes:  Negative for pain and visual disturbance.  Respiratory:  Positive for cough. Negative for shortness of breath.   Cardiovascular:  Negative for chest pain and palpitations.  Gastrointestinal:  Negative for abdominal pain and vomiting.  Genitourinary:  Negative for dysuria and hematuria.  Musculoskeletal:  Positive for myalgias. Negative for arthralgias and back pain.  Skin:  Negative for color change and rash.  Neurological:  Negative for seizures and syncope.  All other systems reviewed and are negative.    Physical Exam Triage Vital Signs ED Triage Vitals [08/06/24 1604]  Encounter Vitals Group     BP (!) 188/100     Girls Systolic BP Percentile      Girls Diastolic BP Percentile      Boys Systolic BP Percentile      Boys Diastolic BP Percentile      Pulse Rate 83     Resp 16     Temp (!) 100.7 F (38.2 C)     Temp Source Oral     SpO2 97 %     Weight      Height      Head Circumference      Peak Flow      Pain Score 10     Pain Loc      Pain Education      Exclude from Growth Chart    No data found.  Updated Vital Signs BP (!) 188/100 (BP Location: Left Arm)   Pulse 83   Temp (!) 100.7 F (38.2 C) (Oral)   Resp 16   SpO2 97%   Visual Acuity Right Eye Distance:   Left Eye Distance:   Bilateral Distance:    Right Eye Near:   Left Eye Near:    Bilateral Near:     Physical Exam Vitals and nursing note reviewed.  Constitutional:      General: He is not in acute distress.    Appearance: He is well-developed.  HENT:     Head: Normocephalic and atraumatic.     Right Ear: Tympanic membrane normal.     Left Ear: Tympanic membrane normal.     Nose: Congestion present.     Mouth/Throat:     Mouth: Mucous membranes are moist.     Pharynx: Posterior  oropharyngeal erythema (mild) present. No oropharyngeal exudate.  Eyes:     Conjunctiva/sclera: Conjunctivae normal.  Cardiovascular:     Rate and Rhythm: Normal rate and regular rhythm.     Heart sounds: No murmur heard. Pulmonary:     Effort:  Pulmonary effort is normal. No respiratory distress.     Breath sounds: Normal breath sounds.  Abdominal:     Palpations: Abdomen is soft.     Tenderness: There is no abdominal tenderness.  Musculoskeletal:        General: No swelling.     Cervical back: Neck supple.  Skin:    General: Skin is warm and dry.     Capillary Refill: Capillary refill takes less than 2 seconds.  Neurological:     General: No focal deficit present.     Mental Status: He is alert.  Psychiatric:        Mood and Affect: Mood normal.      UC Treatments / Results  Labs (all labs ordered are listed, but only abnormal results are displayed) Labs Reviewed  POCT RAPID STREP A (OFFICE)  POC COVID19/FLU A&B COMBO    EKG   Radiology No results found.  Procedures Procedures (including critical care time)  Medications Ordered in UC Medications  ondansetron  (ZOFRAN -ODT) disintegrating tablet 4 mg (has no administration in time range)  acetaminophen  (TYLENOL ) tablet 650 mg (650 mg Oral Given 08/06/24 1614)    Initial Impression / Assessment and Plan / UC Course  I have reviewed the triage vital signs and the nursing notes.  Pertinent labs & imaging results that were available during my care of the patient were reviewed by me and considered in my medical decision making (see chart for details).     Viral upper respiratory infection  Sore throat - Plan: POC Covid19/Flu A&B Antigen, POC Covid19/Flu A&B Antigen  Body aches - Plan: POC Covid19/Flu A&B Antigen, POC Covid19/Flu A&B Antigen  Fever in adult - Plan: POC Covid19/Flu A&B Antigen, POC Covid19/Flu A&B Antigen  Nausea without vomiting   Flu A, flu B, COVID and strep testing done today.  These are  negative. Symptoms are most consistent with a viral infection.  This does not require antibiotic treatment.  We focus treatment on improving the symptoms.  We will treat with the following:  Prednisone  20 mg  once daily for 3 days. Take this in the morning.  This is a steroid to help with inflammation and pain. Benzonatate  (tessalon ) 100 mg every 8 hours as needed for cough.  Zofran  4 mg orally disintegrating tablet every 8 hours as needed for nausea.  Next dose can be taken around 1 AM tomorrow Make sure to stay hydrated by drinking plenty of water. May take Tylenol  as needed for fevers and pain Return to urgent care or PCP if symptoms worsen or fail to resolve.    Final Clinical Impressions(s) / UC Diagnoses   Final diagnoses:  Sore throat  Body aches  Fever in adult  Viral upper respiratory infection  Nausea without vomiting     Discharge Instructions      Flu A, flu B, COVID and strep testing done today.  These are negative. Symptoms are most consistent with a viral infection.  This does not require antibiotic treatment.  We focus treatment on improving the symptoms.  We will treat with the following:  Prednisone  20 mg  once daily for 3 days. Take this in the morning.  This is a steroid to help with inflammation and pain. Benzonatate  (tessalon ) 100 mg every 8 hours as needed for cough.  Zofran  4 mg orally disintegrating tablet every 8 hours as needed for nausea.  Next dose can be taken around 1 AM tomorrow Make sure to stay hydrated by drinking plenty of  water. May take Tylenol  as needed for fevers and pain Return to urgent care or PCP if symptoms worsen or fail to resolve.      ED Prescriptions     Medication Sig Dispense Auth. Provider   predniSONE  (DELTASONE ) 20 MG tablet Take 1 tablet (20 mg total) by mouth daily with breakfast for 3 days. 3 tablet Teresa Norris A, PA-C   benzonatate  (TESSALON ) 100 MG capsule Take 1 capsule (100 mg total) by mouth every 8 (eight) hours.  21 capsule Cambryn Charters A, PA-C   ondansetron  (ZOFRAN -ODT) 4 MG disintegrating tablet Take 1 tablet (4 mg total) by mouth every 8 (eight) hours as needed for nausea or vomiting. 20 tablet Teresa Norris LABOR, [place]      PDMP not reviewed this encounter.   Teresa Norris LABOR, PA-C 08/06/24 1651    Teresa Norris LABOR, PA-C 08/06/24 1651

## 2024-08-06 NOTE — ED Triage Notes (Signed)
 Patient here today with c/o ST, bilat otalgia, fever, chills, sweats, and body aches X 1 day. Patient has used cough drops with some relief. Some coworkers have also been sick.

## 2024-08-06 NOTE — Discharge Instructions (Addendum)
 Flu A, flu B, COVID and strep testing done today.  These are negative. Symptoms are most consistent with a viral infection.  This does not require antibiotic treatment.  We focus treatment on improving the symptoms.  We will treat with the following:  Prednisone  20 mg  once daily for 3 days. Take this in the morning.  This is a steroid to help with inflammation and pain. Benzonatate  (tessalon ) 100 mg every 8 hours as needed for cough.  Zofran  4 mg orally disintegrating tablet every 8 hours as needed for nausea.  Next dose can be taken around 1 AM tomorrow Make sure to stay hydrated by drinking plenty of water. May take Tylenol  as needed for fevers and pain Return to urgent care or PCP if symptoms worsen or fail to resolve.

## 2024-08-28 ENCOUNTER — Telehealth: Payer: Self-pay

## 2024-08-28 NOTE — Telephone Encounter (Signed)
 Copied from CRM #[id number]. Topic: General - Deceased Patient >> September 17, 2024  4:29 PM Delon DASEN wrote: Name of caller: Arland   Date of death: 09/14/24   Name of funeral home: [facility]  Phone number of funeral home: [phone]  Provider that needs to sign form: Dr Chandra  Timeline for signing: asap

## 2024-08-29 NOTE — Telephone Encounter (Signed)
 Can you call the funeral home and ask them if they have any documentation from the EMS or whoever pronounced the patient as deceased?  I have no information on the circumstances surrounding his death and can't fill out a death certificate without knowing the cause of death.

## 2024-08-30 NOTE — Telephone Encounter (Signed)
 Arland called back and provided phone # to EMS, contacted Damien at [phone] and she stated that the patient went to bathroom to vomit and then his family heard him collapse and they attempted CPR and pt was pronounced dead at 04.33. There was nothing found to require a ME to come she said. She said if you need to call for anymore information to text her at the number above because she is out working in the field today.

## 2024-08-30 NOTE — Telephone Encounter (Signed)
 Contacted funeral and requested the information below.  The gentleman stated that Charles Callahan has not come in yet and will have her call office when she does.

## 2024-09-18 DEATH — deceased
